# Patient Record
Sex: Female | Born: 1940 | Race: White | Hispanic: No | Marital: Married | Smoking: Former smoker
Health system: Southern US, Community
[De-identification: ages and names within clinical notes are randomized; demographics above are authoritative.]

## PROBLEM LIST (undated history)

## (undated) DIAGNOSIS — E785 Hyperlipidemia, unspecified: Secondary | ICD-10-CM

## (undated) DIAGNOSIS — M8580 Other specified disorders of bone density and structure, unspecified site: Secondary | ICD-10-CM

## (undated) DIAGNOSIS — N6320 Unspecified lump in the left breast, unspecified quadrant: Secondary | ICD-10-CM

## (undated) DIAGNOSIS — J302 Other seasonal allergic rhinitis: Secondary | ICD-10-CM

## (undated) DIAGNOSIS — H269 Unspecified cataract: Secondary | ICD-10-CM

## (undated) DIAGNOSIS — E079 Disorder of thyroid, unspecified: Secondary | ICD-10-CM

## (undated) DIAGNOSIS — M1990 Unspecified osteoarthritis, unspecified site: Secondary | ICD-10-CM

## (undated) DIAGNOSIS — T7840XA Allergy, unspecified, initial encounter: Secondary | ICD-10-CM

## (undated) DIAGNOSIS — R51 Headache: Secondary | ICD-10-CM

## (undated) DIAGNOSIS — F419 Anxiety disorder, unspecified: Secondary | ICD-10-CM

## (undated) DIAGNOSIS — R112 Nausea with vomiting, unspecified: Secondary | ICD-10-CM

## (undated) DIAGNOSIS — E039 Hypothyroidism, unspecified: Secondary | ICD-10-CM

## (undated) DIAGNOSIS — R918 Other nonspecific abnormal finding of lung field: Secondary | ICD-10-CM

## (undated) DIAGNOSIS — Z9889 Other specified postprocedural states: Secondary | ICD-10-CM

## (undated) HISTORY — PX: COLONOSCOPY: SHX174

## (undated) HISTORY — PX: BREAST SURGERY: SHX581

## (undated) HISTORY — DX: Unspecified cataract: H26.9

## (undated) HISTORY — DX: Disorder of thyroid, unspecified: E07.9

## (undated) HISTORY — DX: Unspecified osteoarthritis, unspecified site: M19.90

## (undated) HISTORY — DX: Other specified disorders of bone density and structure, unspecified site: M85.80

## (undated) HISTORY — PX: EYE SURGERY: SHX253

## (undated) HISTORY — PX: OTHER SURGICAL HISTORY: SHX169

## (undated) HISTORY — DX: Other seasonal allergic rhinitis: J30.2

## (undated) HISTORY — DX: Hyperlipidemia, unspecified: E78.5

## (undated) HISTORY — DX: Allergy, unspecified, initial encounter: T78.40XA

## (undated) HISTORY — DX: Anxiety disorder, unspecified: F41.9

---

## 2000-08-20 ENCOUNTER — Encounter: Payer: Self-pay | Admitting: General Surgery

## 2000-08-20 ENCOUNTER — Encounter: Admission: RE | Admit: 2000-08-20 | Discharge: 2000-08-20 | Payer: Self-pay | Admitting: General Surgery

## 2001-02-21 ENCOUNTER — Encounter: Admission: RE | Admit: 2001-02-21 | Discharge: 2001-02-21 | Payer: Self-pay | Admitting: General Surgery

## 2001-02-21 ENCOUNTER — Encounter: Payer: Self-pay | Admitting: General Surgery

## 2001-08-20 ENCOUNTER — Encounter: Admission: RE | Admit: 2001-08-20 | Discharge: 2001-08-20 | Payer: Self-pay | Admitting: Unknown Physician Specialty

## 2001-08-20 ENCOUNTER — Encounter: Payer: Self-pay | Admitting: Unknown Physician Specialty

## 2002-08-25 ENCOUNTER — Encounter: Payer: Self-pay | Admitting: Unknown Physician Specialty

## 2002-08-25 ENCOUNTER — Encounter: Admission: RE | Admit: 2002-08-25 | Discharge: 2002-08-25 | Payer: Self-pay | Admitting: Unknown Physician Specialty

## 2003-08-28 ENCOUNTER — Encounter: Admission: RE | Admit: 2003-08-28 | Discharge: 2003-08-28 | Payer: Self-pay | Admitting: Unknown Physician Specialty

## 2004-08-30 ENCOUNTER — Encounter: Admission: RE | Admit: 2004-08-30 | Discharge: 2004-08-30 | Payer: Self-pay | Admitting: Family Medicine

## 2005-05-01 ENCOUNTER — Ambulatory Visit: Payer: Self-pay | Admitting: Family Medicine

## 2005-05-24 ENCOUNTER — Ambulatory Visit: Payer: Self-pay | Admitting: Family Medicine

## 2005-06-02 ENCOUNTER — Ambulatory Visit: Payer: Self-pay | Admitting: Family Medicine

## 2005-06-12 ENCOUNTER — Ambulatory Visit: Payer: Self-pay | Admitting: Family Medicine

## 2005-07-20 ENCOUNTER — Ambulatory Visit: Payer: Self-pay | Admitting: Family Medicine

## 2005-08-24 ENCOUNTER — Ambulatory Visit: Payer: Self-pay | Admitting: Family Medicine

## 2005-08-31 ENCOUNTER — Encounter: Admission: RE | Admit: 2005-08-31 | Discharge: 2005-08-31 | Payer: Self-pay | Admitting: Family Medicine

## 2005-09-12 ENCOUNTER — Encounter: Admission: RE | Admit: 2005-09-12 | Discharge: 2005-09-12 | Payer: Self-pay | Admitting: Family Medicine

## 2005-10-18 ENCOUNTER — Ambulatory Visit: Payer: Self-pay | Admitting: Family Medicine

## 2005-11-21 ENCOUNTER — Ambulatory Visit: Payer: Self-pay | Admitting: Family Medicine

## 2006-01-18 ENCOUNTER — Ambulatory Visit: Payer: Self-pay | Admitting: Family Medicine

## 2006-05-30 ENCOUNTER — Ambulatory Visit: Payer: Self-pay | Admitting: Family Medicine

## 2006-09-17 ENCOUNTER — Encounter: Admission: RE | Admit: 2006-09-17 | Discharge: 2006-09-17 | Payer: Self-pay | Admitting: Family Medicine

## 2007-09-18 ENCOUNTER — Encounter: Admission: RE | Admit: 2007-09-18 | Discharge: 2007-09-18 | Payer: Self-pay | Admitting: Family Medicine

## 2008-09-23 ENCOUNTER — Encounter: Admission: RE | Admit: 2008-09-23 | Discharge: 2008-09-23 | Payer: Self-pay | Admitting: Family Medicine

## 2008-09-28 ENCOUNTER — Encounter: Admission: RE | Admit: 2008-09-28 | Discharge: 2008-09-28 | Payer: Self-pay | Admitting: Family Medicine

## 2009-09-28 ENCOUNTER — Encounter: Admission: RE | Admit: 2009-09-28 | Discharge: 2009-09-28 | Payer: Self-pay | Admitting: Family Medicine

## 2010-03-27 ENCOUNTER — Encounter: Payer: Self-pay | Admitting: Family Medicine

## 2010-05-04 ENCOUNTER — Encounter (INDEPENDENT_AMBULATORY_CARE_PROVIDER_SITE_OTHER): Payer: Self-pay | Admitting: *Deleted

## 2010-05-12 NOTE — Letter (Signed)
Summary: Pre Visit Letter Revised  [facility]  [address] [place], [place] [address]   Phone: [phone]  Fax: [phone]        05/04/2010 MRN: [id number] Cynthia Garrett [address] [place], [place]  [address]             Procedure Date:  06-08-10           Direct Colon---Dr. Russella Dar   Welcome to the Gastroenterology Division at [facility].    You are scheduled to see a nurse for your pre-procedure visit on 05-25-10 at 11:00a.m. on the 3rd floor at [facility], [address]  We ask that you try to arrive at our office 15 minutes prior to your appointment time to allow for check-in.  Please take a minute to review the attached form.  If you answer "Yes" to one or more of the questions on the first page, we ask that you call the person listed at your earliest opportunity.  If you answer "No" to all of the questions, please complete the rest of the form and bring it to your appointment.    Your nurse visit will consist of discussing your medical and surgical history, your immediate family medical history, and your medications.   If you are unable to list all of your medications on the form, please bring the medication bottles to your appointment and we will list them.  We will need to be aware of both prescribed and over the counter drugs.  We will need to know exact dosage information as well.    Please be prepared to read and sign documents such as consent forms, a financial agreement, and acknowledgement forms.  If necessary, and with your consent, a friend or relative is welcome to sit-in on the nurse visit with you.  Please bring your insurance card so that we may make a copy of it.  If your insurance requires a referral to see a specialist, please bring your referral form from your primary care physician.  No co-pay is required for this nurse visit.     If you cannot keep your appointment, please call [phone] to cancel or reschedule prior to  your appointment date.  This allows [place] the opportunity to schedule an appointment for another patient in need of care.    Thank you for choosing [facility] for your medical needs.  We appreciate the opportunity to care for you.  Please visit [place] at our website  to learn more about our practice.  Sincerely, The Gastroenterology Division

## 2010-05-25 ENCOUNTER — Encounter: Payer: Self-pay | Admitting: Gastroenterology

## 2010-05-25 ENCOUNTER — Ambulatory Visit (AMBULATORY_SURGERY_CENTER): Payer: Medicare Other

## 2010-05-25 VITALS — Ht 62.0 in | Wt 150.0 lb

## 2010-05-25 DIAGNOSIS — Z1211 Encounter for screening for malignant neoplasm of colon: Secondary | ICD-10-CM

## 2010-05-25 MED ORDER — PEG-KCL-NACL-NASULF-NA ASC-C 100 G PO SOLR: 1.0000 | Freq: Once | ORAL | Status: AC

## 2010-06-03 ENCOUNTER — Encounter: Payer: Self-pay | Admitting: Gastroenterology

## 2010-06-06 ENCOUNTER — Encounter: Payer: Self-pay | Admitting: Gastroenterology

## 2010-06-07 ENCOUNTER — Encounter: Payer: Self-pay | Admitting: Gastroenterology

## 2010-06-08 ENCOUNTER — Ambulatory Visit (AMBULATORY_SURGERY_CENTER): Payer: Medicare Other | Admitting: Gastroenterology

## 2010-06-08 ENCOUNTER — Encounter: Payer: Self-pay | Admitting: Gastroenterology

## 2010-06-08 DIAGNOSIS — Z1211 Encounter for screening for malignant neoplasm of colon: Secondary | ICD-10-CM

## 2010-06-08 DIAGNOSIS — D126 Benign neoplasm of colon, unspecified: Secondary | ICD-10-CM

## 2010-06-08 DIAGNOSIS — K5730 Diverticulosis of large intestine without perforation or abscess without bleeding: Secondary | ICD-10-CM

## 2010-06-08 MED ORDER — SODIUM CHLORIDE 0.9 % IV SOLN: 500.0000 mL | INTRAVENOUS | Status: DC

## 2010-06-08 NOTE — Patient Instructions (Addendum)
Please read over discharge instructions given. Dr.Stark removed 3 colon polyps today and found diverticulosis. Read over education sheets given on polyps,diverticulosis, and high fiber diet.  You will receive result letter in mail in about 1-3 weeks. Resume regular medications today. Resume routine care with primary physician.

## 2010-06-08 NOTE — Progress Notes (Signed)
After pt awake ready to get dressed, she became nauseated and started vomiting green-brown liq about 100 cc. Pt rested then wanted to get dressed and go home. She states she always dose this after any anesthesia and she will feel better at home. Notified Dr.Stark of this, he said she could go home and if nausea continues we will call her medication in today. Explained this to patient and husband, instructed her to call us if needed today before 5. Before d/c pt up in wheelchair,no vomiting at this time.

## 2010-06-09 ENCOUNTER — Telehealth: Payer: Self-pay | Admitting: *Deleted

## 2010-06-09 NOTE — Telephone Encounter (Signed)

## 2010-06-14 ENCOUNTER — Encounter: Payer: Self-pay | Admitting: Gastroenterology

## 2010-08-19 ENCOUNTER — Other Ambulatory Visit: Payer: Self-pay | Admitting: Family Medicine

## 2010-08-19 DIAGNOSIS — Z1231 Encounter for screening mammogram for malignant neoplasm of breast: Secondary | ICD-10-CM

## 2010-10-03 ENCOUNTER — Ambulatory Visit
Admission: RE | Admit: 2010-10-03 | Discharge: 2010-10-03 | Disposition: A | Discharge status: Home / Self Care | Payer: Medicare Other | Source: Ambulatory Visit | Attending: Family Medicine | Admitting: Family Medicine

## 2010-10-03 DIAGNOSIS — Z1231 Encounter for screening mammogram for malignant neoplasm of breast: Secondary | ICD-10-CM

## 2010-10-04 ENCOUNTER — Other Ambulatory Visit: Payer: Self-pay | Admitting: Family Medicine

## 2010-10-04 DIAGNOSIS — R928 Other abnormal and inconclusive findings on diagnostic imaging of breast: Secondary | ICD-10-CM

## 2010-10-13 ENCOUNTER — Ambulatory Visit
Admission: RE | Admit: 2010-10-13 | Discharge: 2010-10-13 | Disposition: A | Discharge status: Home / Self Care | Payer: Medicare Other | Source: Ambulatory Visit | Attending: Family Medicine | Admitting: Family Medicine

## 2010-10-13 DIAGNOSIS — R928 Other abnormal and inconclusive findings on diagnostic imaging of breast: Secondary | ICD-10-CM

## 2011-06-15 ENCOUNTER — Encounter (HOSPITAL_COMMUNITY): Payer: Self-pay | Admitting: Pharmacy Technician

## 2011-06-20 ENCOUNTER — Encounter (HOSPITAL_COMMUNITY): Payer: Self-pay

## 2011-06-20 ENCOUNTER — Encounter (HOSPITAL_COMMUNITY)
Admission: RE | Admit: 2011-06-20 | Discharge: 2011-06-20 | Disposition: A | Discharge status: Home / Self Care | Payer: Medicare Other | Source: Ambulatory Visit | Attending: Ophthalmology | Admitting: Ophthalmology

## 2011-06-20 HISTORY — DX: Nausea with vomiting, unspecified: R11.2

## 2011-06-20 HISTORY — DX: Headache: R51

## 2011-06-20 HISTORY — DX: Hypothyroidism, unspecified: E03.9

## 2011-06-20 HISTORY — DX: Other specified postprocedural states: Z98.890

## 2011-06-20 LAB — BASIC METABOLIC PANEL
BUN: 20 mg/dL (ref 6–23)
Chloride: 100 mEq/L (ref 96–112)
GFR calc Af Amer: 73 mL/min — ABNORMAL LOW (ref >90)
Glucose, Bld: 91 mg/dL (ref 70–99)
Potassium: 4.6 mEq/L (ref 3.5–5.1)
Sodium: 137 mEq/L (ref 135–145)

## 2011-06-20 NOTE — Patient Instructions (Signed)
20 Cynthia Garrett  06/20/2011   Your procedure is scheduled on:  06/26/2011  Report to [facility] at  1000  AM.  Call this number if you have problems the morning of surgery: [phone]   Remember:   Do not eat food:After Midnight.  May have clear liquids:until Midnight .  Clear liquids include soda, tea, black coffee, apple or grape juice, broth.  Take these medicines the morning of surgery with A SIP OF WATER: levothyroxine   Do not wear jewelry, make-up or nail polish.  Do not wear lotions, powders, or perfumes. You may wear deodorant.  Do not shave 48 hours prior to surgery.  Do not bring valuables to the hospital.  Contacts, dentures or bridgework may not be worn into surgery.  Leave suitcase in the car. After surgery it may be brought to your room.  For patients admitted to the hospital, checkout time is 11:00 AM the day of discharge.   Patients discharged the day of surgery will not be allowed to drive home.  Name and phone number of your driver: family  Special Instructions: N/A   Please read over the following fact sheets that you were given: Pain Booklet, Surgical Site Infection Prevention, Anesthesia Post-op Instructions and Care and Recovery After Surgery Cataract A cataract is a clouding of the lens of the eye. When a lens becomes cloudy, vision is reduced based on the degree and nature of the clouding. Many cataracts reduce vision to some degree. Some cataracts make people more near-sighted as they develop. Other cataracts increase glare. Cataracts that are ignored and become worse can sometimes look white. The white color can be seen through the pupil. CAUSES   Aging. However, cataracts may occur at any age, even in newborns.   Certain drugs.   Trauma to the eye.   Certain diseases such as diabetes.   Specific eye diseases such as chronic inflammation inside the eye or a sudden attack of a rare form of glaucoma.   Inherited or acquired medical problems.  SYMPTOMS     Gradual, progressive drop in vision in the affected eye.   Severe, rapid visual loss. This most often happens when trauma is the cause.  DIAGNOSIS  To detect a cataract, an eye doctor examines the lens. Cataracts are best diagnosed with an exam of the eyes with the pupils enlarged (dilated) by drops.  TREATMENT  For an early cataract, vision may improve by using different eyeglasses or stronger lighting. If that does not help your vision, surgery is the only effective treatment. A cataract needs to be surgically removed when vision loss interferes with your everyday activities, such as driving, reading, or watching TV. A cataract may also have to be removed if it prevents examination or treatment of another eye problem. Surgery removes the cloudy lens and usually replaces it with a substitute lens (intraocular lens, IOL).  At a time when both you and your doctor agree, the cataract will be surgically removed. If you have cataracts in both eyes, only one is usually removed at a time. This allows the operated eye to heal and be out of danger from any possible problems after surgery (such as infection or poor wound healing). In rare cases, a cataract may be doing damage to your eye. In these cases, your caregiver may advise surgical removal right away. The vast majority of people who have cataract surgery have better vision afterward. HOME CARE INSTRUCTIONS  If you are not planning surgery, you may be  asked to do the following:  Use different eyeglasses.   Use stronger or brighter lighting.   Ask your eye doctor about reducing your medicine dose or changing medicines if it is thought that a medicine caused your cataract. Changing medicines does not make the cataract go away on its own.   Become familiar with your surroundings. Poor vision can lead to injury. Avoid bumping into things on the affected side. You are at a higher risk for tripping or falling.   Exercise extreme care when driving or  operating machinery.   Wear sunglasses if you are sensitive to bright light or experiencing problems with glare.  SEEK IMMEDIATE MEDICAL CARE IF:   You have a worsening or sudden vision loss.   You notice redness, swelling, or increasing pain in the eye.   You have a fever.  Document Released: 02/20/2005 Document Revised: 02/09/2011 Document Reviewed: 10/14/2010 [facility] Patient Information 2012 [place], [place].PATIENT INSTRUCTIONS POST-ANESTHESIA  IMMEDIATELY FOLLOWING SURGERY:  Do not drive or operate machinery for the first twenty four hours after surgery.  Do not make any important decisions for twenty four hours after surgery or while taking narcotic pain medications or sedatives.  If you develop intractable nausea and vomiting or a severe headache please notify your doctor immediately.  FOLLOW-UP:  Please make an appointment with your surgeon as instructed. You do not need to follow up with anesthesia unless specifically instructed to do so.  WOUND CARE INSTRUCTIONS (if applicable):  Keep a dry clean dressing on the anesthesia/puncture wound site if there is drainage.  Once the wound has quit draining you may leave it open to air.  Generally you should leave the bandage intact for twenty four hours unless there is drainage.  If the epidural site drains for more than 36-48 hours please call the anesthesia department.  QUESTIONS?:  Please feel free to call your physician or the hospital operator if you have any questions, and they will be happy to assist you.     [facility] Anesthesia Department [address] [place] [phone]

## 2011-06-23 MED ORDER — NEOMYCIN-POLYMYXIN-DEXAMETH 3.5-10000-0.1 OP OINT
TOPICAL_OINTMENT | OPHTHALMIC | Status: AC
  Filled 2011-06-23: qty 3.5

## 2011-06-23 MED ORDER — LIDOCAINE HCL 3.5 % OP GEL
OPHTHALMIC | Status: AC
  Filled 2011-06-23: qty 5

## 2011-06-23 MED ORDER — PHENYLEPHRINE HCL 2.5 % OP SOLN
OPHTHALMIC | Status: AC
  Filled 2011-06-23: qty 2

## 2011-06-23 MED ORDER — TETRACAINE HCL 0.5 % OP SOLN
OPHTHALMIC | Status: AC
  Filled 2011-06-23: qty 2

## 2011-06-23 MED ORDER — LIDOCAINE HCL (PF) 1 % IJ SOLN
INTRAMUSCULAR | Status: AC
  Filled 2011-06-23: qty 2

## 2011-06-23 MED ORDER — CYCLOPENTOLATE-PHENYLEPHRINE 0.2-1 % OP SOLN
OPHTHALMIC | Status: AC
  Filled 2011-06-23: qty 2

## 2011-06-26 ENCOUNTER — Encounter (HOSPITAL_COMMUNITY)
Admission: RE | Disposition: A | Discharge status: Home / Self Care | Payer: Self-pay | Source: Ambulatory Visit | Attending: Ophthalmology

## 2011-06-26 ENCOUNTER — Encounter (HOSPITAL_COMMUNITY): Payer: Self-pay

## 2011-06-26 ENCOUNTER — Ambulatory Visit (HOSPITAL_COMMUNITY)
Admission: RE | Admit: 2011-06-26 | Discharge: 2011-06-26 | Disposition: A | Discharge status: Home / Self Care | Payer: Medicare Other | Source: Ambulatory Visit | Attending: Ophthalmology | Admitting: Ophthalmology

## 2011-06-26 ENCOUNTER — Encounter (HOSPITAL_COMMUNITY): Payer: Self-pay | Admitting: Anesthesiology

## 2011-06-26 ENCOUNTER — Ambulatory Visit (HOSPITAL_COMMUNITY): Payer: Medicare Other | Admitting: Anesthesiology

## 2011-06-26 DIAGNOSIS — Z7982 Long term (current) use of aspirin: Secondary | ICD-10-CM | POA: Insufficient documentation

## 2011-06-26 DIAGNOSIS — Z01812 Encounter for preprocedural laboratory examination: Secondary | ICD-10-CM | POA: Insufficient documentation

## 2011-06-26 DIAGNOSIS — H2589 Other age-related cataract: Secondary | ICD-10-CM | POA: Insufficient documentation

## 2011-06-26 DIAGNOSIS — Z01810 Encounter for preprocedural cardiovascular examination: Secondary | ICD-10-CM | POA: Insufficient documentation

## 2011-06-26 DIAGNOSIS — H268 Other specified cataract: Secondary | ICD-10-CM | POA: Insufficient documentation

## 2011-06-26 HISTORY — PX: CATARACT EXTRACTION W/PHACO: SHX586

## 2011-06-26 SURGERY — PHACOEMULSIFICATION, CATARACT, WITH IOL INSERTION
Anesthesia: Monitor Anesthesia Care | Site: Eye | Laterality: Right | Wound class: Clean

## 2011-06-26 MED ORDER — LIDOCAINE HCL (PF) 1 % IJ SOLN
INTRAMUSCULAR | Status: DC | PRN
  Administered 2011-06-26: .5 mL

## 2011-06-26 MED ORDER — BSS IO SOLN
INTRAOCULAR | Status: DC | PRN
  Administered 2011-06-26: 15 mL via INTRAOCULAR

## 2011-06-26 MED ORDER — NEOMYCIN-POLYMYXIN-DEXAMETH 0.1 % OP OINT
TOPICAL_OINTMENT | OPHTHALMIC | Status: DC | PRN
  Administered 2011-06-26: 1 via OPHTHALMIC

## 2011-06-26 MED ORDER — ONDANSETRON HCL 4 MG/2ML IJ SOLN: 4.0000 mg | Freq: Once | INTRAMUSCULAR | Status: DC | PRN

## 2011-06-26 MED ORDER — TETRACAINE HCL 0.5 % OP SOLN
1.0000 [drp] | OPHTHALMIC | Status: AC
  Administered 2011-06-26 (×3): 1 [drp] via OPHTHALMIC

## 2011-06-26 MED ORDER — LACTATED RINGERS IV SOLN
INTRAVENOUS | Status: DC
  Administered 2011-06-26: 08:00:00 via INTRAVENOUS

## 2011-06-26 MED ORDER — FENTANYL CITRATE 0.05 MG/ML IJ SOLN: 25.0000 ug | INTRAMUSCULAR | Status: DC | PRN

## 2011-06-26 MED ORDER — MIDAZOLAM HCL 2 MG/2ML IJ SOLN
INTRAMUSCULAR | Status: AC
  Administered 2011-06-26: 2 mg via INTRAVENOUS
  Filled 2011-06-26: qty 2

## 2011-06-26 MED ORDER — LIDOCAINE HCL 3.5 % OP GEL: 1.0000 "application " | Freq: Once | OPHTHALMIC | Status: DC

## 2011-06-26 MED ORDER — EPINEPHRINE HCL 1 MG/ML IJ SOLN
INTRAMUSCULAR | Status: AC
  Filled 2011-06-26: qty 1

## 2011-06-26 MED ORDER — LIDOCAINE 3.5 % OP GEL OPTIME - NO CHARGE
OPHTHALMIC | Status: DC | PRN
  Administered 2011-06-26: 2 [drp] via OPHTHALMIC

## 2011-06-26 MED ORDER — CYCLOPENTOLATE-PHENYLEPHRINE 0.2-1 % OP SOLN
1.0000 [drp] | OPHTHALMIC | Status: AC
  Administered 2011-06-26 (×3): 1 [drp] via OPHTHALMIC

## 2011-06-26 MED ORDER — PROVISC 10 MG/ML IO SOLN
INTRAOCULAR | Status: DC | PRN
  Administered 2011-06-26: 8.5 mg via INTRAOCULAR

## 2011-06-26 MED ORDER — MIDAZOLAM HCL 2 MG/2ML IJ SOLN
1.0000 mg | INTRAMUSCULAR | Status: DC | PRN
  Administered 2011-06-26: 2 mg via INTRAVENOUS

## 2011-06-26 MED ORDER — EPINEPHRINE HCL 1 MG/ML IJ SOLN
INTRAOCULAR | Status: DC | PRN
  Administered 2011-06-26: 09:00:00

## 2011-06-26 MED ORDER — POVIDONE-IODINE 5 % OP SOLN
OPHTHALMIC | Status: DC | PRN
  Administered 2011-06-26: 1 via OPHTHALMIC

## 2011-06-26 MED ORDER — PHENYLEPHRINE HCL 2.5 % OP SOLN
1.0000 [drp] | OPHTHALMIC | Status: AC
  Administered 2011-06-26 (×3): 1 [drp] via OPHTHALMIC

## 2011-06-26 SURGICAL SUPPLY — 33 items
CAPSULAR TENSION RING-AMO (OPHTHALMIC RELATED) IMPLANT
CLOTH BEACON ORANGE TIMEOUT ST (SAFETY) ×2 IMPLANT
EYE SHIELD UNIVERSAL CLEAR (GAUZE/BANDAGES/DRESSINGS) ×4 IMPLANT
GLOVE BIO SURGEON STRL SZ 6.5 (GLOVE) IMPLANT
GLOVE BIOGEL PI IND STRL 6.5 (GLOVE) ×2 IMPLANT
GLOVE BIOGEL PI IND STRL 7.0 (GLOVE) IMPLANT
GLOVE BIOGEL PI IND STRL 7.5 (GLOVE) IMPLANT
GLOVE BIOGEL PI INDICATOR 6.5 (GLOVE) ×2
GLOVE BIOGEL PI INDICATOR 7.0 (GLOVE)
GLOVE BIOGEL PI INDICATOR 7.5 (GLOVE)
GLOVE ECLIPSE 6.5 STRL STRAW (GLOVE) IMPLANT
GLOVE ECLIPSE 7.0 STRL STRAW (GLOVE) IMPLANT
GLOVE ECLIPSE 7.5 STRL STRAW (GLOVE) IMPLANT
GLOVE EXAM NITRILE LRG STRL (GLOVE) IMPLANT
GLOVE EXAM NITRILE MD LF STRL (GLOVE) ×2 IMPLANT
GLOVE SKINSENSE NS SZ6.5 (GLOVE)
GLOVE SKINSENSE NS SZ7.0 (GLOVE)
GLOVE SKINSENSE STRL SZ6.5 (GLOVE) IMPLANT
GLOVE SKINSENSE STRL SZ7.0 (GLOVE) IMPLANT
GOWN STRL REIN XL XLG (GOWN DISPOSABLE) ×2 IMPLANT
KIT VITRECTOMY (OPHTHALMIC RELATED) IMPLANT
PAD ARMBOARD 7.5X6 YLW CONV (MISCELLANEOUS) ×2 IMPLANT
PROC W NO LENS (INTRAOCULAR LENS)
PROC W SPEC LENS (INTRAOCULAR LENS)
PROCESS W NO LENS (INTRAOCULAR LENS) IMPLANT
PROCESS W SPEC LENS (INTRAOCULAR LENS) IMPLANT
RING MALYGIN (MISCELLANEOUS) IMPLANT
SIGHTPATH CAT PROC W REG LENS (Ophthalmic Related) ×2 IMPLANT
SYR TB 1ML LL NO SAFETY (SYRINGE) ×2 IMPLANT
TAPE SURG TRANSPORE 1 IN (GAUZE/BANDAGES/DRESSINGS) ×1 IMPLANT
TAPE SURGICAL TRANSPORE 1 IN (GAUZE/BANDAGES/DRESSINGS) ×1
VISCOELASTIC ADDITIONAL (OPHTHALMIC RELATED) IMPLANT
WATER STERILE IRR 250ML POUR (IV SOLUTION) ×2 IMPLANT

## 2011-06-26 NOTE — Discharge Instructions (Signed)
Cynthia Garrett  06/26/2011     Instructions  1. Use medications as Instructed.  Shake well before use. Wait 5 minutes between drops.  {OPHTHALMIC ANTIBIOTICS:22167} 4 times a day x 1 week.  {OPHTHALMIC ANTI-INFLAMMATORY:22168} 2 times a day x 4 weeks.  {OPHTHALMIC STEROID:22169} 4 times a day - week 1   3 times a day - Week 2, 2 times a day- Week 3, 1 time a day - Week 4.  2. Do not rub the operative eye. Do not swim underwater for 2 weeks.  3. You may remove the clear shield and resume your normal activities the day after  Surgery. Your eyes may feel more comfortable if you wear dark glasses outside.  4. Call our office at [phone] if you have sudden change in vision, extreme redness or pain. Some fluctuation in vision is normal after surgery. If you have an emergency after hours, call Dr. Alto Denver at [phone].  5. It is important that you attend all of your follow-up appointments.        Follow-up:{follow up:32580} with Gemma Payor, MD.   Dr. Lahoma Crocker: [phone]  Dr. Lita Mains: [phone]  Dr. Alto Denver: [phone]   If you find that you cannot contact your physician, but feel that your signs and   Symptoms warrant a physician's attention, call the Emergency Room at   [phone].   Other{NA AND WUXLKGMW:10272}.

## 2011-06-26 NOTE — Anesthesia Preprocedure Evaluation (Signed)
Anesthesia Evaluation  Patient identified by MRN, date of birth, ID band  Reviewed: Allergy & Precautions, H&P , NPO status , Patient's Chart, lab work & pertinent test results  History of Anesthesia Complications (+) PONV  Airway Mallampati: III      Dental  (+) Teeth Intact and Implants   Pulmonary  breath sounds clear to auscultation        Cardiovascular negative cardio ROS  Rhythm:Regular Rate:Normal     Neuro/Psych  Headaches,    GI/Hepatic   Endo/Other  Hypothyroidism   Renal/GU      Musculoskeletal   Abdominal   Peds  Hematology   Anesthesia Other Findings   Reproductive/Obstetrics                           Anesthesia Physical Anesthesia Plan  ASA: II  Anesthesia Plan: MAC   Post-op Pain Management:    Induction: Intravenous  Airway Management Planned: Nasal Cannula  Additional Equipment:   Intra-op Plan:   Post-operative Plan:   Informed Consent: I have reviewed the patients History and Physical, chart, labs and discussed the procedure including the risks, benefits and alternatives for the proposed anesthesia with the patient or authorized representative who has indicated his/her understanding and acceptance.     Plan Discussed with:   Anesthesia Plan Comments:         Anesthesia Quick Evaluation

## 2011-06-26 NOTE — Brief Op Note (Signed)
Pre-Op Dx: Cataract OD,  Pseudoexfoliation syndrome Post-Op Dx: Cataract OD, Pseudoexfoliation syndrome Surgeon: Gemma Payor Anesthesia: Topical with MAC Implant: B&L enVista Specimen: None Complications: None

## 2011-06-26 NOTE — Transfer of Care (Signed)
Immediate Anesthesia Transfer of Care Note  Patient: Cynthia Garrett  Procedure(s) Performed: Procedure(s) (LRB): CATARACT EXTRACTION PHACO AND INTRAOCULAR LENS PLACEMENT (IOC) (Right)  Patient Location: PACU and Short Stay  Anesthesia Type: MAC  Level of Consciousness: awake, alert  and oriented  Airway & Oxygen Therapy: Patient Spontanous Breathing  Post-op Assessment: Report given to PACU RN  Post vital signs: Reviewed and stable  Complications: No apparent anesthesia complications

## 2011-06-26 NOTE — Op Note (Signed)
Cynthia Garrett, Cynthia Garrett               ACCOUNT NO.:  [id number]  MEDICAL RECORD NO.:  [id number]  LOCATION:  APPO                          FACILITY:  [facility]  PHYSICIAN:  Susanne Greenhouse, MD       DATE OF BIRTH:  01/09/1941  DATE OF PROCEDURE:  06/26/2011 DATE OF DISCHARGE:  06/26/2011                              OPERATIVE REPORT   PREOPERATIVE DIAGNOSES: 1. 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  POSTOPERATIVE DIAGNOSES: 1. Combined cataract, right eye, diagnosis code 366.19. 2. 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  OPERATION PERFORMED:  Phacoemulsification with posterior chamber intraocular lens implantation, right eye.  SURGEON:  Bonne Dolores. Quinlan Mcfall, MD  ANESTHESIA:  General endotracheal anesthesia.  OPERATIVE SUMMARY:  In the preoperative area, dilating drops were placed into the right eye.  The patient was then brought into the operating room where she was placed under general anesthesia.  The eye was then prepped and draped.  Beginning with a 75 blade, a paracentesis port was made at the surgeon's 2 o'clock position.  The anterior chamber was then filled with a 1% nonpreserved lidocaine solution with epinephrine.  This was followed by Viscoat to deepen the chamber.  A small fornix-based peritomy was performed superiorly.  Next, a single iris hook was placed through the limbus superiorly.  A 2.4-mm keratome blade was then used to make a clear corneal incision over the iris hook.  A bent cystotome needle and Utrata forceps were used to create a continuous tear capsulotomy.  Hydrodissection was performed using balanced salt solution on a fine cannula.  The lens nucleus was then removed using phacoemulsification in a quadrant cracking technique.  The cortical material was then removed with irrigation and aspiration.  The capsular bag and anterior chamber were refilled with Provisc.  The wound  was widened to approximately 3 mm and a posterior chamber intraocular lens was placed into the capsular bag without difficulty using an [employer] lens injecting system.  A single 10-0 nylon suture was then used to close the incision as well as stromal hydration.  The Provisc was removed from the anterior chamber and capsular bag with irrigation and aspiration.  At this point, the wounds were tested for leak, which were negative.  The anterior chamber remained deep and stable.  The patient tolerated the procedure well.  There were no operative complications, and she awoke from general anesthesia without problem.  No surgical specimens.  Prosthetic device used is a Actuary, EnVista posterior chamber lens, model MX60, power of 22.5, serial number is [id number].          ______________________________ Susanne Greenhouse, MD     KEH/MEDQ  D:  06/26/2011  T:  06/26/2011  Job:  [id number]

## 2011-06-26 NOTE — H&P (Signed)
I have reviewed the H&P, the patient was re-examined, and I have identified no interval changes in medical condition and plan of care since the history and physical of record  

## 2011-06-26 NOTE — Anesthesia Postprocedure Evaluation (Signed)
  Anesthesia Post-op Note  Patient: Cynthia Garrett  Procedure(s) Performed: Procedure(s) (LRB): CATARACT EXTRACTION PHACO AND INTRAOCULAR LENS PLACEMENT (IOC) (Right)  Patient Location: PACU and Short Stay  Anesthesia Type: MAC  Level of Consciousness: awake  Airway and Oxygen Therapy: Patient Spontanous Breathing  Post-op Pain: none  Post-op Assessment: Post-op Vital signs reviewed, Patient's Cardiovascular Status Stable, Respiratory Function Stable, Patent Airway and No signs of Nausea or vomiting  Post-op Vital Signs: Reviewed and stable  Complications: No apparent anesthesia complications

## 2011-06-28 ENCOUNTER — Encounter (HOSPITAL_COMMUNITY): Payer: Self-pay | Admitting: Ophthalmology

## 2011-07-04 ENCOUNTER — Encounter (HOSPITAL_COMMUNITY): Payer: Self-pay

## 2011-07-04 ENCOUNTER — Encounter (HOSPITAL_COMMUNITY): Payer: Medicare Other

## 2011-07-05 MED ORDER — TETRACAINE HCL 0.5 % OP SOLN
OPHTHALMIC | Status: AC
  Administered 2011-07-06: 1 [drp] via OPHTHALMIC
  Filled 2011-07-05: qty 2

## 2011-07-05 MED ORDER — NEOMYCIN-POLYMYXIN-DEXAMETH 3.5-10000-0.1 OP OINT
TOPICAL_OINTMENT | OPHTHALMIC | Status: AC
  Filled 2011-07-05: qty 3.5

## 2011-07-06 ENCOUNTER — Ambulatory Visit (HOSPITAL_COMMUNITY)
Admission: RE | Admit: 2011-07-06 | Discharge: 2011-07-06 | Disposition: A | Discharge status: Home / Self Care | Payer: Medicare Other | Source: Ambulatory Visit | Attending: Ophthalmology | Admitting: Ophthalmology

## 2011-07-06 ENCOUNTER — Encounter (HOSPITAL_COMMUNITY): Payer: Self-pay | Admitting: Anesthesiology

## 2011-07-06 ENCOUNTER — Encounter (HOSPITAL_COMMUNITY)
Admission: RE | Disposition: A | Discharge status: Home / Self Care | Payer: Self-pay | Source: Ambulatory Visit | Attending: Ophthalmology

## 2011-07-06 ENCOUNTER — Encounter (HOSPITAL_COMMUNITY): Payer: Self-pay | Admitting: *Deleted

## 2011-07-06 ENCOUNTER — Ambulatory Visit (HOSPITAL_COMMUNITY): Payer: Medicare Other | Admitting: Anesthesiology

## 2011-07-06 DIAGNOSIS — H2589 Other age-related cataract: Secondary | ICD-10-CM | POA: Insufficient documentation

## 2011-07-06 DIAGNOSIS — H2181 Floppy iris syndrome: Secondary | ICD-10-CM | POA: Insufficient documentation

## 2011-07-06 HISTORY — PX: CATARACT EXTRACTION W/PHACO: SHX586

## 2011-07-06 SURGERY — PHACOEMULSIFICATION, CATARACT, WITH IOL INSERTION
Anesthesia: Monitor Anesthesia Care | Site: Eye | Laterality: Left | Wound class: Clean

## 2011-07-06 MED ORDER — BSS IO SOLN
INTRAOCULAR | Status: DC | PRN
  Administered 2011-07-06: 15 mL via INTRAOCULAR

## 2011-07-06 MED ORDER — ONDANSETRON HCL 4 MG/2ML IJ SOLN
4.0000 mg | Freq: Once | INTRAMUSCULAR | Status: AC
  Administered 2011-07-06: 4 mg via INTRAVENOUS

## 2011-07-06 MED ORDER — TETRACAINE HCL 0.5 % OP SOLN
1.0000 [drp] | OPHTHALMIC | Status: AC
  Administered 2011-07-06 (×3): 1 [drp] via OPHTHALMIC

## 2011-07-06 MED ORDER — LIDOCAINE HCL 3.5 % OP GEL
1.0000 "application " | Freq: Once | OPHTHALMIC | Status: AC
  Administered 2011-07-06: 1 via OPHTHALMIC

## 2011-07-06 MED ORDER — LACTATED RINGERS IV SOLN
INTRAVENOUS | Status: DC
  Administered 2011-07-06: 1000 mL via INTRAVENOUS

## 2011-07-06 MED ORDER — PROVISC 10 MG/ML IO SOLN
INTRAOCULAR | Status: DC | PRN
  Administered 2011-07-06: 8.5 mg via INTRAOCULAR

## 2011-07-06 MED ORDER — CYCLOPENTOLATE-PHENYLEPHRINE 0.2-1 % OP SOLN
1.0000 [drp] | OPHTHALMIC | Status: AC
  Administered 2011-07-06 (×3): 1 [drp] via OPHTHALMIC

## 2011-07-06 MED ORDER — CYCLOPENTOLATE-PHENYLEPHRINE 0.2-1 % OP SOLN
OPHTHALMIC | Status: AC
  Administered 2011-07-06: 1 [drp] via OPHTHALMIC
  Filled 2011-07-06: qty 2

## 2011-07-06 MED ORDER — LACTATED RINGERS IV SOLN
INTRAVENOUS | Status: DC | PRN
  Administered 2011-07-06: 13:00:00 via INTRAVENOUS

## 2011-07-06 MED ORDER — LIDOCAINE HCL 3.5 % OP GEL
OPHTHALMIC | Status: AC
  Administered 2011-07-06: 1 via OPHTHALMIC
  Filled 2011-07-06: qty 5

## 2011-07-06 MED ORDER — LIDOCAINE HCL (PF) 1 % IJ SOLN
INTRAMUSCULAR | Status: AC
  Filled 2011-07-06: qty 2

## 2011-07-06 MED ORDER — NEOMYCIN-POLYMYXIN-DEXAMETH 0.1 % OP OINT
TOPICAL_OINTMENT | OPHTHALMIC | Status: DC | PRN
  Administered 2011-07-06: 1 via OPHTHALMIC

## 2011-07-06 MED ORDER — LIDOCAINE HCL (PF) 1 % IJ SOLN
INTRAMUSCULAR | Status: DC | PRN
  Administered 2011-07-06: .3 mL

## 2011-07-06 MED ORDER — PHENYLEPHRINE HCL 2.5 % OP SOLN
OPHTHALMIC | Status: AC
  Administered 2011-07-06: 1 [drp] via OPHTHALMIC
  Filled 2011-07-06: qty 2

## 2011-07-06 MED ORDER — PHENYLEPHRINE HCL 2.5 % OP SOLN
1.0000 [drp] | OPHTHALMIC | Status: AC
  Administered 2011-07-06 (×3): 1 [drp] via OPHTHALMIC

## 2011-07-06 MED ORDER — POVIDONE-IODINE 5 % OP SOLN
OPHTHALMIC | Status: DC | PRN
  Administered 2011-07-06: 1 via OPHTHALMIC

## 2011-07-06 MED ORDER — EPINEPHRINE HCL 1 MG/ML IJ SOLN
INTRAMUSCULAR | Status: AC
  Filled 2011-07-06: qty 1

## 2011-07-06 MED ORDER — EPINEPHRINE HCL 1 MG/ML IJ SOLN
INTRAOCULAR | Status: DC | PRN
  Administered 2011-07-06: 13:00:00

## 2011-07-06 MED ORDER — MIDAZOLAM HCL 2 MG/2ML IJ SOLN
1.0000 mg | INTRAMUSCULAR | Status: DC | PRN
  Administered 2011-07-06: 2 mg via INTRAVENOUS

## 2011-07-06 MED ORDER — LIDOCAINE 3.5 % OP GEL OPTIME - NO CHARGE
OPHTHALMIC | Status: DC | PRN
  Administered 2011-07-06: 1 [drp] via OPHTHALMIC

## 2011-07-06 SURGICAL SUPPLY — 30 items
CAPSULAR TENSION RING-AMO (OPHTHALMIC RELATED) IMPLANT
CLOTH BEACON ORANGE TIMEOUT ST (SAFETY) ×2 IMPLANT
EYE SHIELD UNIVERSAL CLEAR (GAUZE/BANDAGES/DRESSINGS) ×4 IMPLANT
GLOVE BIO SURGEON STRL SZ 6.5 (GLOVE) ×2 IMPLANT
GLOVE BIOGEL PI IND STRL 6.5 (GLOVE) ×1 IMPLANT
GLOVE BIOGEL PI IND STRL 7.0 (GLOVE) IMPLANT
GLOVE BIOGEL PI IND STRL 7.5 (GLOVE) IMPLANT
GLOVE BIOGEL PI INDICATOR 6.5 (GLOVE) ×1
GLOVE BIOGEL PI INDICATOR 7.0 (GLOVE)
GLOVE BIOGEL PI INDICATOR 7.5 (GLOVE)
GLOVE ECLIPSE 6.5 STRL STRAW (GLOVE) IMPLANT
GLOVE ECLIPSE 7.0 STRL STRAW (GLOVE) IMPLANT
GLOVE ECLIPSE 7.5 STRL STRAW (GLOVE) IMPLANT
GLOVE EXAM NITRILE LRG STRL (GLOVE) IMPLANT
GLOVE EXAM NITRILE MD LF STRL (GLOVE) ×2 IMPLANT
GLOVE SKINSENSE NS SZ6.5 (GLOVE)
GLOVE SKINSENSE NS SZ7.0 (GLOVE)
GLOVE SKINSENSE STRL SZ6.5 (GLOVE) IMPLANT
GLOVE SKINSENSE STRL SZ7.0 (GLOVE) IMPLANT
KIT VITRECTOMY (OPHTHALMIC RELATED) IMPLANT
PAD ARMBOARD 7.5X6 YLW CONV (MISCELLANEOUS) ×2 IMPLANT
PROC W NO LENS (INTRAOCULAR LENS)
PROC W SPEC LENS (INTRAOCULAR LENS)
PROCESS W NO LENS (INTRAOCULAR LENS) IMPLANT
PROCESS W SPEC LENS (INTRAOCULAR LENS) IMPLANT
RING MALYGIN (MISCELLANEOUS) IMPLANT
SIGHTPATH CAT PROC W REG LENS (Ophthalmic Related) ×2 IMPLANT
SYR TB 1ML LL NO SAFETY (SYRINGE) ×2 IMPLANT
VISCOELASTIC ADDITIONAL (OPHTHALMIC RELATED) IMPLANT
WATER STERILE IRR 250ML POUR (IV SOLUTION) ×2 IMPLANT

## 2011-07-06 NOTE — Brief Op Note (Signed)
Pre-Op Dx: Cataract OS Post-Op Dx: Cataract OS Surgeon: Dequavius Kuhner Anesthesia: Topical with MAC Surgery: Cataract Extraction with Intraocular lens Implant OS Implant: B&L enVista Specimen: None Complications: None 

## 2011-07-06 NOTE — Anesthesia Postprocedure Evaluation (Signed)
  Anesthesia Post-op Note  Patient: Cynthia Garrett  Procedure(s) Performed: Procedure(s) (LRB): CATARACT EXTRACTION PHACO AND INTRAOCULAR LENS PLACEMENT (IOC) (Left)  Patient Location: PACU and Short Stay  Anesthesia Type: MAC  Level of Consciousness: awake, alert  and oriented  Airway and Oxygen Therapy: Patient Spontanous Breathing  Post-op Pain: none  Post-op Assessment: Post-op Vital signs reviewed, Patient's Cardiovascular Status Stable, Respiratory Function Stable, Patent Airway, No signs of Nausea or vomiting and Pain level controlled  Post-op Vital Signs: Reviewed  Complications: No apparent anesthesia complications

## 2011-07-06 NOTE — Op Note (Signed)
NAMESHONI, QUIJAS               ACCOUNT NO.:  [id number]  MEDICAL RECORD NO.:  [id number]  LOCATION:  APPO                          FACILITY:  [facility]  PHYSICIAN:  Susanne Greenhouse, MD       DATE OF BIRTH:  23-Jun-1940  DATE OF PROCEDURE:  07/06/2011 DATE OF DISCHARGE:  07/06/2011                              OPERATIVE REPORT   PREOPERATIVE DIAGNOSIS:  Combined cataract, left eye, diagnosis code 366.19.  POSTOPERATIVE DIAGNOSES:  Combined cataract, left eye, diagnosis code 366.19, intraoperative floppy iris syndrome, left eye, diagnosis code 364.81.  SURGEON:  Bonne Dolores. Cletis Clack, MD  ANESTHESIA:  Topical with monitored anesthesia care.  DESCRIPTION OF THE OPERATION:  In the preoperative holding area, dilating drop and viscous lidocaine were placed into the left eye.  The patient was then brought to the operating room where she was prepped and draped.  Beginning with a #75 blade, a paracentesis port was made at the surgeon's 2 o'clock position.  The anterior chamber was filled with a 1% nonpreserved lidocaine solution.  Because of poor visualization of the red reflex, the anterior chamber was filled with VisionBlue and the VisionBlue was then rinsed from the anterior chamber with balanced salt solution.  The anterior chamber was then filled with Provisc.  A 2.4-mm keratome blade was then used to make a clear corneal incision at the temporal limbus.  A bent cystotome needle was used to create a continuous tear capsulotomy.  Hydrodissection was performed with balanced salt solution and a fine cannula.  The lens nucleus was then removed using phacoemulsification and a quadrant cracking technique. Residual cortex was removed with irrigation and aspiration.  A capsular bag and anterior chamber were refilled with Provisc and a posterior chamber intraocular lens was placed into the capsular bag without difficulty using its lens injecting system.  The Provisc was then removed from the capsular  bag and anterior chamber with irrigation and aspiration.  Stromal hydration of the main incision and paracentesis ports was performed with balanced salt solution and a fine cannula.  The wounds were tested for leak, which were negative.  The patient tolerated the procedure well.  There were no operative complications and she was returned to the recovery area in satisfactory condition.  No surgical specimens.  Prosthetic device used is a Designer, industrial/product lens, model MX60, power of 23.5, serial number is [id number].         ______________________________ Susanne Greenhouse, MD    KEH/MEDQ  D:  07/06/2011  T:  07/06/2011  Job:  [id number]

## 2011-07-06 NOTE — Anesthesia Preprocedure Evaluation (Signed)
Anesthesia Evaluation  Patient identified by MRN, date of birth, ID band  Reviewed: Allergy & Precautions, H&P , NPO status , Patient's Chart, lab work & pertinent test results  History of Anesthesia Complications (+) PONV  Airway Mallampati: III      Dental  (+) Teeth Intact and Implants   Pulmonary  breath sounds clear to auscultation        Cardiovascular negative cardio ROS  Rhythm:Regular Rate:Normal     Neuro/Psych  Headaches,    GI/Hepatic   Endo/Other  Hypothyroidism   Renal/GU      Musculoskeletal   Abdominal   Peds  Hematology   Anesthesia Other Findings   Reproductive/Obstetrics                           Anesthesia Physical Anesthesia Plan  ASA: II  Anesthesia Plan: MAC   Post-op Pain Management:    Induction: Intravenous  Airway Management Planned: Nasal Cannula  Additional Equipment:   Intra-op Plan:   Post-operative Plan:   Informed Consent: I have reviewed the patients History and Physical, chart, labs and discussed the procedure including the risks, benefits and alternatives for the proposed anesthesia with the patient or authorized representative who has indicated his/her understanding and acceptance.     Plan Discussed with:   Anesthesia Plan Comments:         Anesthesia Quick Evaluation  

## 2011-07-06 NOTE — Preoperative (Signed)
Beta Blockers   Reason not to administer Beta Blockers:Not Applicable 

## 2011-07-06 NOTE — Discharge Instructions (Signed)
Cynthia Garrett  07/06/2011     Instructions  1. Use medications as Instructed.  Shake well before use. Wait 5 minutes between drops.  {OPHTHALMIC ANTIBIOTICS:22167} 4 times a day x 1 week.  {OPHTHALMIC ANTI-INFLAMMATORY:22168} 2 times a day x 4 weeks.  {OPHTHALMIC STEROID:22169} 4 times a day - week 1   3 times a day - Week 2, 2 times a day- Week 3, 1 time a day - Week 4.  2. Do not rub the operative eye. Do not swim underwater for 2 weeks.  3. You may remove the clear shield and resume your normal activities the day after  Surgery. Your eyes may feel more comfortable if you wear dark glasses outside.  4. Call our office at [phone] if you have sudden change in vision, extreme redness or pain. Some fluctuation in vision is normal after surgery. If you have an emergency after hours, call Dr. Alto Denver at [phone].  5. It is important that you attend all of your follow-up appointments.        Follow-up:{follow up:32580} with Gemma Payor, MD.   Dr. Lahoma Crocker: [phone]  Dr. Lita Mains: [phone]  Dr. Alto Denver: [phone]   If you find that you cannot contact your physician, but feel that your signs and   Symptoms warrant a physician's attention, call the Emergency Room at   [phone].   Other{NA AND [id number]:44034}.

## 2011-07-06 NOTE — H&P (Signed)
I have reviewed the H&P, the patient was re-examined, and I have identified no interval changes in medical condition and plan of care since the history and physical of record  

## 2011-07-06 NOTE — Transfer of Care (Signed)
Immediate Anesthesia Transfer of Care Note  Patient: Cynthia Garrett  Procedure(s) Performed: Procedure(s) (LRB): CATARACT EXTRACTION PHACO AND INTRAOCULAR LENS PLACEMENT (IOC) (Left)  Patient Location: PACU and Short Stay  Anesthesia Type: MAC  Level of Consciousness: awake, alert  and oriented  Airway & Oxygen Therapy: Patient Spontanous Breathing  Post-op Assessment: Report given to PACU RN  Post vital signs: Reviewed  Complications: No apparent anesthesia complications

## 2011-07-10 ENCOUNTER — Encounter (HOSPITAL_COMMUNITY): Payer: Self-pay | Admitting: Ophthalmology

## 2011-08-07 ENCOUNTER — Other Ambulatory Visit: Payer: Self-pay | Admitting: Family Medicine

## 2011-08-07 DIAGNOSIS — Z1231 Encounter for screening mammogram for malignant neoplasm of breast: Secondary | ICD-10-CM

## 2011-10-17 ENCOUNTER — Ambulatory Visit: Payer: Medicare Other

## 2011-10-18 ENCOUNTER — Ambulatory Visit
Admission: RE | Admit: 2011-10-18 | Discharge: 2011-10-18 | Disposition: A | Discharge status: Home / Self Care | Payer: Medicare Other | Source: Ambulatory Visit | Attending: Family Medicine | Admitting: Family Medicine

## 2011-10-18 DIAGNOSIS — Z1231 Encounter for screening mammogram for malignant neoplasm of breast: Secondary | ICD-10-CM

## 2012-09-02 ENCOUNTER — Other Ambulatory Visit: Payer: Self-pay

## 2012-09-02 DIAGNOSIS — Z1231 Encounter for screening mammogram for malignant neoplasm of breast: Secondary | ICD-10-CM

## 2012-10-21 ENCOUNTER — Ambulatory Visit
Admission: RE | Admit: 2012-10-21 | Discharge: 2012-10-21 | Disposition: A | Discharge status: Home / Self Care | Payer: Medicare Other | Source: Ambulatory Visit

## 2012-10-21 DIAGNOSIS — Z1231 Encounter for screening mammogram for malignant neoplasm of breast: Secondary | ICD-10-CM

## 2013-09-09 ENCOUNTER — Telehealth: Payer: Self-pay | Admitting: Family Medicine

## 2013-09-10 NOTE — Telephone Encounter (Signed)
Wrong chart

## 2013-09-19 ENCOUNTER — Other Ambulatory Visit: Payer: Self-pay

## 2013-09-19 DIAGNOSIS — Z1231 Encounter for screening mammogram for malignant neoplasm of breast: Secondary | ICD-10-CM

## 2013-10-22 ENCOUNTER — Ambulatory Visit
Admission: RE | Admit: 2013-10-22 | Discharge: 2013-10-22 | Disposition: A | Discharge status: Home / Self Care | Payer: Medicare Other | Source: Ambulatory Visit

## 2013-10-22 DIAGNOSIS — Z1231 Encounter for screening mammogram for malignant neoplasm of breast: Secondary | ICD-10-CM

## 2014-09-08 ENCOUNTER — Other Ambulatory Visit: Payer: Self-pay

## 2014-09-08 DIAGNOSIS — Z1231 Encounter for screening mammogram for malignant neoplasm of breast: Secondary | ICD-10-CM

## 2014-10-26 ENCOUNTER — Ambulatory Visit
Admission: RE | Admit: 2014-10-26 | Discharge: 2014-10-26 | Disposition: A | Discharge status: Home / Self Care | Payer: Medicare Other | Source: Ambulatory Visit

## 2014-10-26 ENCOUNTER — Encounter (INDEPENDENT_AMBULATORY_CARE_PROVIDER_SITE_OTHER): Payer: Self-pay

## 2014-10-26 DIAGNOSIS — Z1231 Encounter for screening mammogram for malignant neoplasm of breast: Secondary | ICD-10-CM

## 2015-04-30 ENCOUNTER — Encounter: Payer: Self-pay | Admitting: Gastroenterology

## 2015-06-16 ENCOUNTER — Ambulatory Visit (AMBULATORY_SURGERY_CENTER): Payer: Self-pay | Admitting: *Deleted

## 2015-06-16 VITALS — Ht 62.0 in | Wt 151.0 lb

## 2015-06-16 DIAGNOSIS — Z86010 Personal history of colonic polyps: Secondary | ICD-10-CM

## 2015-06-16 MED ORDER — NA SULFATE-K SULFATE-MG SULF 17.5-3.13-1.6 GM/177ML PO SOLN: 1.0000 | Freq: Once | ORAL | Status: DC

## 2015-06-16 NOTE — Progress Notes (Signed)
No egg or soy allergy. No anesthesia problems. Nausea and vomiting.  No home O2.  No diet meds.

## 2015-06-30 ENCOUNTER — Ambulatory Visit (AMBULATORY_SURGERY_CENTER): Payer: Medicare Other | Admitting: Gastroenterology

## 2015-06-30 ENCOUNTER — Encounter: Payer: Self-pay | Admitting: Gastroenterology

## 2015-06-30 VITALS — BP 132/71 | HR 71 | Temp 98.0°F | Resp 16 | Ht 62.0 in | Wt 151.0 lb

## 2015-06-30 DIAGNOSIS — D125 Benign neoplasm of sigmoid colon: Secondary | ICD-10-CM | POA: Diagnosis not present

## 2015-06-30 DIAGNOSIS — K635 Polyp of colon: Secondary | ICD-10-CM

## 2015-06-30 DIAGNOSIS — Z86010 Personal history of colonic polyps: Secondary | ICD-10-CM | POA: Diagnosis not present

## 2015-06-30 MED ORDER — SODIUM CHLORIDE 0.9 % IV SOLN: 500.0000 mL | INTRAVENOUS | Status: DC

## 2015-06-30 NOTE — Patient Instructions (Signed)
YOU HAD AN ENDOSCOPIC PROCEDURE TODAY AT [facility]:   Refer to the procedure report that was given to you for any specific questions about what was found during the examination.  If the procedure report does not answer your questions, please call your gastroenterologist to clarify.  If you requested that your care partner not be given the details of your procedure findings, then the procedure report has been included in a sealed envelope for you to review at your convenience later.  YOU SHOULD EXPECT: Some feelings of bloating in the abdomen. Passage of more gas than usual.  Walking can help get rid of the air that was put into your GI tract during the procedure and reduce the bloating. If you had a lower endoscopy (such as a colonoscopy or flexible sigmoidoscopy) you may notice spotting of blood in your stool or on the toilet paper. If you underwent a bowel prep for your procedure, you may not have a normal bowel movement for a few days.  Please Note:  You might notice some irritation and congestion in your nose or some drainage.  This is from the oxygen used during your procedure.  There is no need for concern and it should clear up in a day or so.  SYMPTOMS TO REPORT IMMEDIATELY:   Following lower endoscopy (colonoscopy or flexible sigmoidoscopy):  Excessive amounts of blood in the stool  Significant tenderness or worsening of abdominal pains  Swelling of the abdomen that is new, acute  Fever of 100F or higher   For urgent or emergent issues, a gastroenterologist can be reached at any hour by calling [phone].   DIET: Your first meal following the procedure should be a small meal and then it is ok to progress to your normal diet. Heavy or fried foods are harder to digest and may make you feel nauseous or bloated.  Likewise, meals heavy in dairy and vegetables can increase bloating.  Drink plenty of fluids but you should avoid alcoholic beverages for 24  hours.  ACTIVITY:  You should plan to take it easy for the rest of today and you should NOT DRIVE or use heavy machinery until tomorrow (because of the sedation medicines used during the test).   Please read all handouts given to you by your recovery Rn. FOLLOW UP: Our staff will call the number listed on your records the next business day following your procedure to check on you and address any questions or concerns that you may have regarding the information given to you following your procedure. If we do not reach you, we will leave a message.  However, if you are feeling well and you are not experiencing any problems, there is no need to return our call.  We will assume that you have returned to your regular daily activities without incident.  If any biopsies were taken you will be contacted by phone or by letter within the next 1-3 weeks.  Please call us at [phone] if you have not heard about the biopsies in 3 weeks.    SIGNATURES/CONFIDENTIALITY: You and/or your care partner have signed paperwork which will be entered into your electronic medical record.  These signatures attest to the fact that that the information above on your After Visit Summary has been reviewed and is understood.  Full responsibility of the confidentiality of this discharge information lies with you and/or your care-partner.  Thank you for letting us take care of your healthcare needs.

## 2015-06-30 NOTE — Progress Notes (Signed)
Report to PACU, RN, vss, BBS= Clear.  

## 2015-06-30 NOTE — Op Note (Signed)
[facility] Patient Name: Cynthia Garrett Procedure Date: 06/30/2015 10:57 AM MRN: [id number] Endoscopist: Ladene Artist , MD Age: 75 Date of Birth: 12-08-1940 Gender: Female Procedure:                Colonoscopy Indications:              Surveillance: Personal history of adenomatous                            polyps on last colonoscopy > 5 years ago Medicines:                Monitored Anesthesia Care Procedure:                Pre-Anesthesia Assessment:                           - Prior to the procedure, a History and Physical                            was performed, and patient medications and                            allergies were reviewed. The patient's tolerance of                            previous anesthesia was also reviewed. The risks                            and benefits of the procedure and the sedation                            options and risks were discussed with the patient.                            All questions were answered, and informed consent                            was obtained. Prior Anticoagulants: The patient has                            taken no previous anticoagulant or antiplatelet                            agents. ASA Grade Assessment: II - A patient with                            mild systemic disease. After reviewing the risks                            and benefits, the patient was deemed in                            satisfactory condition to undergo the procedure.  After obtaining informed consent, the colonoscope                            was passed under direct vision. Throughout the                            procedure, the patient's blood pressure, pulse, and                            oxygen saturations were monitored continuously. The                            Model [id number]-[id number]) scope was introduced                            through the anus and advanced to the the cecum,                     identified by appendiceal orifice and ileocecal                            valve. The colonoscopy was performed without                            difficulty. The patient tolerated the procedure                            well. The quality of the bowel preparation was                            good. The ileocecal valve, appendiceal orifice, and                            rectum were photographed. Scope In: [id number] Scope Out: 11:20:24 AM Scope Withdrawal Time: 0 hours 10 minutes 24 seconds  Total Procedure Duration: 0 hours 14 minutes 13 seconds  Findings:                 The digital rectal exam was normal.                           A 5 mm polyp was found in the sigmoid colon. The                            polyp was sessile. The polyp was removed with a                            cold snare. Resection and retrieval were complete.                           Many medium-mouthed diverticula were found in the                            sigmoid colon. There was evidence of diverticular  spasm. There was no evidence of diverticular                            bleeding.                           The exam was otherwise normal throughout the                            examined colon.                           The retroflexed view of the distal rectum and anal                            verge was normal and showed no anal or rectal                            abnormalities. Complications:            No immediate complications. Estimated Blood Loss:     Estimated blood loss: none. Impression:               - One 5 mm polyp in the sigmoid colon, removed with                            a cold snare. Resected and retrieved.                           - Moderate diverticulosis in the sigmoid colon. Recommendation:           - Patient has a contact number available for                            emergencies. The signs and symptoms of potential                             delayed complications were discussed with the                            patient. Return to normal activities tomorrow.                            Written discharge instructions were provided to the                            patient.                           - Resume previous diet.                           - Continue present medications.                           - Await pathology results.                           -  No repeat colonoscopy due to age. Ladene Artist, MD 06/30/2015 11:24:07 AM This report has been signed electronically.

## 2015-06-30 NOTE — Progress Notes (Signed)
Called to room to assist during endoscopic procedure.  Patient ID and intended procedure confirmed with present staff. Received instructions for my participation in the procedure from the performing physician.  

## 2015-07-01 ENCOUNTER — Telehealth: Payer: Self-pay | Admitting: *Deleted

## 2015-07-01 NOTE — Telephone Encounter (Signed)
Left message on f/u call 

## 2015-07-09 ENCOUNTER — Encounter: Payer: Self-pay | Admitting: Gastroenterology

## 2015-09-17 DIAGNOSIS — K582 Mixed irritable bowel syndrome: Secondary | ICD-10-CM | POA: Insufficient documentation

## 2015-09-20 ENCOUNTER — Other Ambulatory Visit: Payer: Self-pay | Admitting: Family Medicine

## 2015-09-20 DIAGNOSIS — Z1231 Encounter for screening mammogram for malignant neoplasm of breast: Secondary | ICD-10-CM

## 2015-10-25 DIAGNOSIS — R918 Other nonspecific abnormal finding of lung field: Secondary | ICD-10-CM | POA: Insufficient documentation

## 2015-10-28 ENCOUNTER — Ambulatory Visit
Admission: RE | Admit: 2015-10-28 | Discharge: 2015-10-28 | Disposition: A | Discharge status: Home / Self Care | Payer: Medicare Other | Source: Ambulatory Visit | Attending: Family Medicine | Admitting: Family Medicine

## 2015-10-28 DIAGNOSIS — Z1231 Encounter for screening mammogram for malignant neoplasm of breast: Secondary | ICD-10-CM

## 2015-11-17 ENCOUNTER — Encounter: Payer: Self-pay | Admitting: Pulmonary Disease

## 2015-11-17 ENCOUNTER — Ambulatory Visit (INDEPENDENT_AMBULATORY_CARE_PROVIDER_SITE_OTHER): Payer: Medicare Other | Admitting: Pulmonary Disease

## 2015-11-17 DIAGNOSIS — R918 Other nonspecific abnormal finding of lung field: Secondary | ICD-10-CM | POA: Diagnosis not present

## 2015-11-17 DIAGNOSIS — E039 Hypothyroidism, unspecified: Secondary | ICD-10-CM | POA: Insufficient documentation

## 2015-11-17 DIAGNOSIS — M779 Enthesopathy, unspecified: Secondary | ICD-10-CM | POA: Insufficient documentation

## 2015-11-17 DIAGNOSIS — F411 Generalized anxiety disorder: Secondary | ICD-10-CM | POA: Insufficient documentation

## 2015-11-17 DIAGNOSIS — E785 Hyperlipidemia, unspecified: Secondary | ICD-10-CM | POA: Insufficient documentation

## 2015-11-17 DIAGNOSIS — J302 Other seasonal allergic rhinitis: Secondary | ICD-10-CM | POA: Insufficient documentation

## 2015-11-17 DIAGNOSIS — M8580 Other specified disorders of bone density and structure, unspecified site: Secondary | ICD-10-CM | POA: Insufficient documentation

## 2015-11-17 NOTE — Progress Notes (Signed)
Subjective:    Patient ID: Cynthia Garrett, female    DOB: 1941-02-03, 75 y.o.   MRN: [id number]  HPI She reports no prior history of lung nodules before being found to have an incidental nodule in her left lower lobe. She had a repeat CT scan approximately 1 year later that showed additional lung nodules. Reports rare coughing. Rare dyspnea or wheezing. No chest pain or pressure. No fever, chills, or sweats. Does have hot flashes. She reports no unexpected weight loss. No adenopathy in her neck, groin, or axilla. No abdominal pain, nausea, or emesis. No odynophagia or dysphagia.   Review of Systems No rashes or abnormal bruising. Intermittent headaches that are chronic. No focal vision loss, weakness, numbness, or tingling. A pertinent 14 point review of systems is negative except as per the history of presenting illness.  Allergies  Allergen Reactions  . Cholestatin   . Tetanus Toxoids Swelling    Redness at site of injection  . Nitrofurantoin Nausea And Vomiting    Current Outpatient Prescriptions on File Prior to Visit  Medication Sig Dispense Refill  . calcium citrate-vitamin D (CITRACAL+D) 315-200 MG-UNIT per tablet Take 2 tablets by mouth 2 (two) times daily.      Marland Kitchen levothyroxine (SYNTHROID, LEVOTHROID) 75 MCG tablet Take 75 mcg by mouth daily before breakfast.    . LORazepam (ATIVAN) 0.5 MG tablet Take 0.5 mg by mouth every 8 (eight) hours.    . Multiple Vitamins-Minerals (MULTIVITAMIN WITH MINERALS) tablet Take 1 tablet by mouth daily.      . norethindrone-ethinyl estradiol (JINTELI) 1-5 MG-MCG TABS tablet 1/2 tablet on Monday and 1/2 tab on friday    . pravastatin (PRAVACHOL) 40 MG tablet Take 40 mg by mouth daily.     No current facility-administered medications on file prior to visit.     Past Medical History:  Diagnosis Date  . Allergic rhinitis, seasonal   . Anxiety   . Cataract    extraction  . Headache(784.0)   . Hyperlipidemia   . Hypothyroidism   .  Osteopenia   . PONV (postoperative nausea and vomiting)   . Thyroid disease     Past Surgical History:  Procedure Laterality Date  . CATARACT EXTRACTION W/PHACO  06/26/2011   Procedure: CATARACT EXTRACTION PHACO AND INTRAOCULAR LENS PLACEMENT (IOC);  Surgeon: Tonny Branch, MD;  Location: [facility];  Service: Ophthalmology;  Laterality: Right;  CDE 10.34  . CATARACT EXTRACTION W/PHACO  07/06/2011   Procedure: CATARACT EXTRACTION PHACO AND INTRAOCULAR LENS PLACEMENT (IOC);  Surgeon: Tonny Branch, MD;  Location: [facility];  Service: Ophthalmology;  Laterality: Left;  CDE:9.92  . COLONOSCOPY    . Hysterotomy     fibroid tumor    Family History  Problem Relation Age of Onset  . Heart disease Mother   . Hypertension Mother   . Diabetes Sister   . Hypertension Sister   . COPD Brother   . Anesthesia problems Neg Hx   . Hypotension Neg Hx   . Malignant hyperthermia Neg Hx   . Pseudochol deficiency Neg Hx   . Colon cancer Neg Hx   . Cancer Neg Hx     Social History   Social History  . Marital status: Married    Spouse name: N/A  . Number of children: N/A  . Years of education: N/A   Social History Main Topics  . Smoking status: Former Smoker    Packs/day: 0.25    Years: 10.00    Types: Cigarettes  Quit date: 06/07/1973  . Smokeless tobacco: Never Used     Comment: Significant second-hand exposure.  . Alcohol use No  . Drug use: No  . Sexual activity: Yes    Birth control/ protection: Post-menopausal   Other Topics Concern  . None   Social History Narrative   Originally from [place]. Always lived in [place]. Previously has traveled from [facility] to [place], [place], [place], [place], [place], [place], & [place]. Has dog. No bird exposure. Had bats in her attic previously that have been removed. No mold or hot tub exposure. Previously worked as an Agricultural consultant in a Restaurant manager, fast food. Previously enjoyed [place]. She has been doing Tai Chi.       Objective:   Physical Exam BP 122/76 (BP Location: Left  Arm, Cuff Size: Normal)   Pulse 79   Ht 5' 2"  (1.575 m)   Wt 147 lb (66.7 kg)   SpO2 98%   BMI 26.89 kg/m  General:  Awake. Alert. No acute distress. Accompanied by husband today.  Integument:  Warm & dry. No rash on exposed skin. No bruising. Lymphatics:  No appreciated cervical or supraclavicular lymphadenoapthy. HEENT:  Moist mucus membranes. No oral ulcers. No scleral injection or icterus.  Cardiovascular:  Regular rate. No edema. No appreciable JVD.  Pulmonary:  Good aeration & clear to auscultation bilaterally. Symmetric chest wall expansion. No accessory muscle use. Abdomen: Soft. Normal bowel sounds. Nondistended. Grossly nontender. Musculoskeletal:  Normal bulk and tone. Hand grip strength 5/5 bilaterally. No joint deformity or effusion appreciated. Neurological:  CN 2-12 grossly in tact. No meningismus. Moving all 4 extremities equally. Symmetric brachioradialis deep tendon reflexes. Psychiatric:  Mood and affect congruent. Speech normal rhythm, rate & tone.   IMAGING CT CHEST W/O 10/25/15 (personally reviewed by me): No pleural effusion or thickening appreciated. No pericardial effusion. No pathologic mediastinal adenopathy. Previously noted 4 mm left lower lobe nodule not appreciated by me but reportedly stable on radiology review. Patient does have a 3 mm nodule as well as 2 additional 5 mm nodules in the right middle lobe. Radiology did a cystic lesion in the left lobe of the liver.    Assessment & Plan:  75 y.o. female incidentally found to have bilateral lung nodules. Patient's left lower lobe nodule seems to remain stable. On my review she does have 3 subcentimeter pulmonary nodules within her right middle lobe. With her personal history of tobacco use as well as significant secondhand exposure I feel that close follow-up is reasonable. Certainly it is possible she has additional exposure as a cause for scar tissue formation including her recent found exposure to bats within her  on home but in the absence of infectious symptoms I do not feel that further serum workup for infectious workup at this time is necessary. I instructed the patient to contact my office if she had any new breathing problems before her next appointment.  1. Multiple lung nodules: Repeat CT chest without contrast in 2 months. 2. Follow-up: Patient to return to clinic in 2 months after CT scan.  Sonia Baller Ashok Cordia, M.D. [facility] Pager:  [phone] After 3pm or if no response, call [phone] 3:48 PM 11/17/15

## 2015-11-17 NOTE — Patient Instructions (Signed)
   Call me if you have any questions or new breathing problems before your next appointment.  I will see you back in 2 months after your CT scan.  TESTS ORDERED: 1. CT Chest w/o in 2 months ([facility])

## 2015-12-09 ENCOUNTER — Encounter: Payer: Self-pay | Admitting: *Deleted

## 2016-01-11 ENCOUNTER — Ambulatory Visit (INDEPENDENT_AMBULATORY_CARE_PROVIDER_SITE_OTHER): Payer: Self-pay | Admitting: Orthopaedic Surgery

## 2016-02-01 ENCOUNTER — Telehealth: Payer: Self-pay | Admitting: Pulmonary Disease

## 2016-02-01 NOTE — Telephone Encounter (Signed)
Called and spoke to pt. Pt is requesting results of recent CT chest that was done at [facility]. Pt aware that JN will need to be in office to review the results on PACS system.   JN please advise. Thanks.

## 2016-02-02 NOTE — Telephone Encounter (Signed)
Pt calling to check on status of results.Cynthia Garrett

## 2016-02-04 NOTE — Telephone Encounter (Signed)
Spoke with Cynthia Garrett. She is still awaiting to hear her results from the CT she had a [facility]. Advised her that Durene Cal has not been in the office to review this. She will await our call once JN reviews this CT.

## 2016-02-14 ENCOUNTER — Encounter: Payer: Self-pay | Admitting: Pulmonary Disease

## 2016-02-14 ENCOUNTER — Ambulatory Visit (INDEPENDENT_AMBULATORY_CARE_PROVIDER_SITE_OTHER): Payer: Medicare Other | Admitting: Pulmonary Disease

## 2016-02-14 VITALS — BP 124/66 | HR 78 | Ht 62.0 in | Wt 152.0 lb

## 2016-02-14 DIAGNOSIS — R918 Other nonspecific abnormal finding of lung field: Secondary | ICD-10-CM

## 2016-02-14 NOTE — Telephone Encounter (Signed)
Patient was seen in clinic this morning, and results were reviewed with her in clinic.  Nothing further needed.

## 2016-02-14 NOTE — Telephone Encounter (Signed)
JN Please advise of results.    Reminder sent to nurse as well.

## 2016-02-14 NOTE — Progress Notes (Signed)
Subjective:    Patient ID: Cynthia Garrett, female    DOB: 02-Dec-1940, 75 y.o.   MRN: [id number]  C.C.:  Follow-up for Multiple Lung Nodules.  HPI Multiple Lung Nodules:  Has right middle lobe nodules. Largest nodule now measuring 61m and unchanged comparing recent CT scan with original CT in August 2017. No weight loss.   Review of Systems No chest pain or pressure. No fever, chills, or sweats. No abdominal pain, nausea or emesis.  Allergies  Allergen Reactions  . Cholestatin   . Tetanus Toxoids Swelling    Redness at site of injection  . Nitrofurantoin Nausea And Vomiting    Current Outpatient Prescriptions on File Prior to Visit  Medication Sig Dispense Refill  . calcium citrate-vitamin D (CITRACAL+D) 315-200 MG-UNIT per tablet Take 2 tablets by mouth 2 (two) times daily.      .Marland Kitchenlevothyroxine (SYNTHROID, LEVOTHROID) 75 MCG tablet Take 75 mcg by mouth daily before breakfast.    . LORazepam (ATIVAN) 0.5 MG tablet Take 0.5 mg by mouth every 8 (eight) hours.    . Multiple Vitamins-Minerals (MULTIVITAMIN WITH MINERALS) tablet Take 1 tablet by mouth daily.      . norethindrone-ethinyl estradiol (JINTELI) 1-5 MG-MCG TABS tablet 1/2 tablet on Monday and 1/2 tab on friday    . pravastatin (PRAVACHOL) 40 MG tablet Take 40 mg by mouth daily.    .Marland KitchenHYDROcodone-acetaminophen (NORCO/VICODIN) 5-325 MG tablet Take by mouth. 1/2 tablet every 4 hrs as needed     No current facility-administered medications on file prior to visit.     Past Medical History:  Diagnosis Date  . Allergic rhinitis, seasonal   . Anxiety   . Cataract    extraction  . Headache(784.0)   . Hyperlipidemia   . Hypothyroidism   . Osteopenia   . PONV (postoperative nausea and vomiting)   . Thyroid disease     Past Surgical History:  Procedure Laterality Date  . CATARACT EXTRACTION W/PHACO  06/26/2011   Procedure: CATARACT EXTRACTION PHACO AND INTRAOCULAR LENS PLACEMENT (IOC);  Surgeon: KTonny Branch MD;  Location:  [facility];  Service: Ophthalmology;  Laterality: Right;  CDE 10.34  . CATARACT EXTRACTION W/PHACO  07/06/2011   Procedure: CATARACT EXTRACTION PHACO AND INTRAOCULAR LENS PLACEMENT (IOC);  Surgeon: KTonny Branch MD;  Location: [facility];  Service: Ophthalmology;  Laterality: Left;  CDE:9.92  . COLONOSCOPY    . Hysterotomy     fibroid tumor    Family History  Problem Relation Age of Onset  . Heart disease Mother   . Hypertension Mother   . Diabetes Sister   . Hypertension Sister   . COPD Brother   . Anesthesia problems Neg Hx   . Hypotension Neg Hx   . Malignant hyperthermia Neg Hx   . Pseudochol deficiency Neg Hx   . Colon cancer Neg Hx   . Cancer Neg Hx     Social History   Social History  . Marital status: Married    Spouse name: N/A  . Number of children: N/A  . Years of education: N/A   Social History Main Topics  . Smoking status: Former Smoker    Packs/day: 0.25    Years: 10.00    Types: Cigarettes    Quit date: 06/07/1973  . Smokeless tobacco: Never Used     Comment: Significant second-hand exposure.  . Alcohol use No  . Drug use: No  . Sexual activity: Yes    Birth control/ protection: Post-menopausal  Other Topics Concern  . None   Social History Narrative   Originally from [place]. Always lived in [place]. Previously has traveled from [facility] to [place], [place], [place], [place], [place], [place], & [place]. Has dog. No bird exposure. Had bats in her attic previously that have been removed. No mold or hot tub exposure. Previously worked as an Agricultural consultant in a Restaurant manager, fast food. Previously enjoyed [place]. She has been doing Tai Chi.       Objective:   Physical Exam BP 124/66 (BP Location: Right Arm, Cuff Size: Normal)   Pulse 78   Ht 5' 2"  (1.575 m)   Wt 152 lb (68.9 kg)   SpO2 97%   BMI 27.80 kg/m  General:  Awake. Alert. No acute distress. Accompanied by husband today.  Integument:  Warm & dry. No rash on exposed skin. No bruising. Lymphatics:  No appreciated cervical or  supraclavicular lymphadenoapthy. HEENT:  Moist mucus membranes. No oral ulcers. No scleral injection or icterus.  Cardiovascular:  Regular rate. No edema. No appreciable JVD.  Pulmonary:  Good aeration & clear to auscultation bilaterally. Symmetric chest wall expansion. No accessory muscle use. Abdomen: Soft. Normal bowel sounds. Nondistended. Grossly nontender.  IMAGING CT CHEST W/O 01/25/16 (personally reviewed by me): 7 mm nodule within lateral segment right middle lobe & 3 mm nodule within lateral segment of right middle lobe both relatively unchanged. No pleural effusion or thickening. No pericardial effusion. No pathologic mediastinal adenopathy.  CT CHEST W/O 10/25/15 (previously reviewed by me): No pleural effusion or thickening appreciated. No pericardial effusion. No pathologic mediastinal adenopathy. Previously noted 4 mm left lower lobe nodule not appreciated by me but reportedly stable on radiology review. Patient does have a 3 mm nodule as well as 2 additional 5 mm nodules in the right middle lobe. Radiology did a cystic lesion in the left lobe of the liver.    Assessment & Plan:  75 y.o. female incidentally found to have bilateral lung nodules on CT imaging in August 2017.Largest nodule measures 7 mm and does not appear to have appreciably changed in size. Given the patient's age and risk of malignancy we will be repeating CT imaging at the 6 month interval. I will contact the patient with her CT scan result and plan for follow-up in August assuming that it is stable and repeat imaging in 12 months is needed. I instructed the patient contact my office if she had any new breathing problems or questions before next appointment.  1. Multiple lung nodules: Repeat CT chest without contrast in February 2018 given size of right middle lobe nodule. Plan for further imaging in August 2018 depending upon results of February CT scan. 2. Follow-up: Patient to return to clinic in August 2018 or  sooner if needed.  Sonia Baller Ashok Cordia, M.D. [facility] Pulmonary & Critical Care Pager:  [phone] After 3pm or if no response, call [phone] 10:48 AM 02/14/16

## 2016-02-14 NOTE — Patient Instructions (Signed)
   Call me if you have any new breathing problems before your next appointment.  Call my office if you don't hear back about your CT scan result in February after a week or so.  I will see you back in August 2018 or sooner if needed.  TESTS ORDERED: 1. CT CHEST W/O February 2018

## 2016-02-21 ENCOUNTER — Encounter: Payer: Self-pay | Admitting: Physical Therapy

## 2016-02-21 ENCOUNTER — Ambulatory Visit: Payer: Medicare Other | Attending: Neurosurgery | Admitting: Physical Therapy

## 2016-02-21 DIAGNOSIS — M5442 Lumbago with sciatica, left side: Secondary | ICD-10-CM | POA: Insufficient documentation

## 2016-02-21 NOTE — Patient Instructions (Signed)
Lower abdominal/core stability exercises  1. Practice your breathing technique: Inhale through your nose expanding your belly and rib cage. Try not to breathe into your chest. Exhale slowly and gradually out your mouth feeling a sense of softness to your body. Practice multiple times. This can be performed unlimited.  2. Finding the lower abdominals. Laying on your back with the knees bent, place your fingers just below your belly button. Using your breathing technique from above, on your exhale gently pull the belly button away from your fingertips without tensing any other muscles. Practice this 5x. Next, as you exhale, draw belly button inwards and hold onto it...then feel as if you are pulling that muscle across your pelvis like you are tightening a belt. This can be hard to do at first so be patient and practice. Do 5-10 reps 1-3 x day. Always recognize quality over quantity; if your abdominal muscles become tired you will notice you may tighten/contract other muscles. This is the time to take a break.   Practice this first laying on your back, then in sitting, progressing to standing and finally adding it to all your daily movements.   3. Finding your pelvic floor. Using the breathing technique above, when your exhale, this time draw your pelvic floor muscles up as if you were attempting to stop the flow of urination. Be careful NOT to tense any other muscles. This can be hard, BE PATIENT. Try to hold up to 10 seconds repeating 10x. Try 2x a day. Once you feel you are doing this well, add this contraction to exercise #2. First contracting your pelvic floor followed by lower abdominals.   4. Adding leg movements. Add the following leg movements to challenge your ability to keep your core stable:  1. Single leg drop outs: Laying on your back with knees bent feet flat. Inhale,  dropping one knee outward KEEPING YOUR PELVIS STILL. Exhale as you bring the leg back, simultaneously performing your lower  abdominal contraction. Do 5-10 on each leg.   2. Marching: While keeping your pelvis still, lift the right foot a few inches, put it down then lift left foot. This will mimic a march. Start slow to establish control. Once you have control you may speed it up. Do 10-20x. You MUST keep your lower abdominlas contracted while you march. Breathe naturally    3. Single leg slides: Inhale while you slowly slide one leg out keeping your pelvis still. Only slide your leg as far as you can keep your pelvis still. Exhale as you bring the leg back to the start, contracting the lower abdominals as you do that. Keep your upper body relaxed. Do 5-10 on each side.     Madelyn Flavors, PT 02/21/16 1:50 PM [facility] [address] [place], [place], [address] Phone: [phone]   Fax:  [phone]

## 2016-02-21 NOTE — Therapy (Signed)
[facility] [place], [place], [address] Phone: [phone]   Fax:  [phone]  Physical Therapy Evaluation  Patient Details  Name: Cynthia Garrett MRN: [id number] Date of Birth: 14-Apr-1940 Referring Provider: Glenna Fellows, MD  Encounter Date: 02/21/2016      PT End of Session - 02/21/16 1306    Visit Number 1   Number of Visits 12   Date for PT Re-Evaluation 04/03/16   PT Start Time [id number]   PT Stop Time 1351   PT Time Calculation (min) 45 min   Activity Tolerance Patient tolerated treatment well   Behavior During Therapy [facility] for tasks assessed/performed      Past Medical History:  Diagnosis Date  . Allergic rhinitis, seasonal   . Anxiety   . Cataract    extraction  . Headache(784.0)   . Hyperlipidemia   . Hypothyroidism   . Osteopenia   . PONV (postoperative nausea and vomiting)   . Thyroid disease     Past Surgical History:  Procedure Laterality Date  . CATARACT EXTRACTION W/PHACO  06/26/2011   Procedure: CATARACT EXTRACTION PHACO AND INTRAOCULAR LENS PLACEMENT (IOC);  Surgeon: Tonny Branch, MD;  Location: [facility];  Service: Ophthalmology;  Laterality: Right;  CDE 10.34  . CATARACT EXTRACTION W/PHACO  07/06/2011   Procedure: CATARACT EXTRACTION PHACO AND INTRAOCULAR LENS PLACEMENT (IOC);  Surgeon: Tonny Branch, MD;  Location: [facility];  Service: Ophthalmology;  Laterality: Left;  CDE:9.92  . COLONOSCOPY    . Hysterotomy     fibroid tumor    There were no vitals filed for this visit.       Subjective Assessment - 02/21/16 1317    Subjective Patient began experiencing left leg pain in June and now she intermittently has back pain.   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 Yes   Pain Score 1    Pain Location Back   Pain Orientation Right;Left   Pain Descriptors / Indicators Pressure   Pain Type Acute pain   Pain Radiating Towards LLE to heel (posterior)   Pain Onset More than a month ago   Pain Frequency Intermittent   Aggravating Factors  vacuuming, inconsistent   Pain Relieving Factors heat and ice   Effect of Pain on Daily Activities unable to yoga or anything with twisting; walking limted to half mile            [facility] PT Assessment - 02/21/16 0001      Assessment   Medical Diagnosis Acquired spondylolisthsis of lumbosacral region   Referring Provider Glenna Fellows, MD   Next MD Visit March 2018     Precautions   Precautions Back   Precaution Comments Avoid extension     Balance Screen   Has the patient fallen in the past 6 months No   Has the patient had a decrease in activity level because of a fear of falling?  No   Is the patient reluctant to leave their home because of a fear of falling?  No     Home Environment   Additional Comments Independent at home     Prior Function   Level of Independence Independent     Observation/Other Assessments   Focus on Therapeutic Outcomes (FOTO)  47% LIMITED     Posture/Postural Control   Posture/Postural  Control Postural limitations   Postural Limitations Increased lumbar lordosis;Anterior pelvic tilt     ROM / Strength   AROM / PROM / Strength AROM;Strength     AROM   Overall AROM Comments Lumbar ROM WNL     Strength   Overall Strength Comments B hip flex 4-/5, R hip ext  4-/5, L 4/5; R knee flex 4+/5, L 4-/5; B hip ABD, knee ext 5/5; TA strength baseline is supine with single leg clam.     Palpation   Spinal mobility panful with Gd I PA mobs to B lumbar L3-5   Palpation comment R gluteals, L QL,      Special Tests    Special Tests Lumbar   Lumbar Tests --  Neg slump and SLR B                           PT Education - 02/21/16 1405    Education provided Yes   Education Details HEP   Person(s) Educated Patient   Methods  Explanation;Demonstration;Handout;Verbal cues;Tactile cues   Comprehension Verbalized understanding;Returned demonstration             PT Long Term Goals - 02/21/16 1401      PT LONG TERM GOAL #1   Title I with HEP   Time 6   Period Weeks   Status New     PT LONG TERM GOAL #2   Title Patient to report decreased pain with ADLS to [id number] or less.   Time 6   Period Weeks   Status New     PT LONG TERM GOAL #3   Title Patient to report no pain in LLE.   Time 6   Period Weeks   Status New     PT LONG TERM GOAL #4   Title Patient  to demo good core strength with standing core TE without compensation.   Time 6   Period Weeks   Status New               Plan - 02/21/16 1354    Clinical Impression Statement Patient presents with low back pain with left sided radiculopathy. She has a fragmented disc (to left) putting pressure on her nerve and a spondylolisthesis at L5/S1. These conditions are limiting her activiites and restricting ADLS. She has some BLE weakness and core weakness as well.   Rehab Potential Good   PT Frequency 2x / week   PT Duration 6 weeks   PT Treatment/Interventions ADLs/Self Care Home Management;Cryotherapy;Electrical Stimulation;Moist Heat;Therapeutic exercise;Neuromuscular re-education;Patient/family education;Manual techniques;Dry needling   PT Next Visit Plan ADL modifications, progress core stengthening, HS strength and functional strengthening (squats). Modalities prn. Check L ankle eversion strength.   PT Home Exercise Plan Transverse Abdominus progressions and ADL mod for sink and vacuuming.   Consulted and Agree with Plan of Care Patient      Patient will benefit from skilled therapeutic intervention in order to improve the following deficits and impairments:  Pain, Decreased strength, Postural dysfunction  Visit Diagnosis: Acute midline low back pain with left-sided sciatica - Plan: PT plan of care cert/re-cert      G-Codes - 123456  1407    Functional Assessment Tool Used FOTO 47% LIMITED   Functional Limitation Other PT primary   Other PT Primary Current Status [id number]) At least 40 percent but less than 60 percent impaired, limited or restricted   Other PT Primary Goal Status [id number]) At least 20  percent but less than 40 percent impaired, limited or restricted       Problem List Patient Active Problem List   Diagnosis Date Noted  . Seasonal allergies 11/17/2015  . Anxiety state 11/17/2015  . Hypothyroidism 11/17/2015  . Hyperlipidemia 11/17/2015  . Osteopenia 11/17/2015  . Tendonitis 11/17/2015  . Multiple lung nodules on CT 10/25/2015   Madelyn Flavors PT 02/21/2016, 2:17 PM  [facility] [address] [place], [place], [address] Phone: [phone]   Fax:  [phone]  Name: Cynthia Garrett MRN: [id number] Date of Birth: May 27, 1940

## 2016-03-02 ENCOUNTER — Encounter: Payer: Self-pay | Admitting: Physical Therapy

## 2016-03-02 ENCOUNTER — Ambulatory Visit: Payer: Medicare Other | Admitting: Physical Therapy

## 2016-03-02 DIAGNOSIS — M5442 Lumbago with sciatica, left side: Secondary | ICD-10-CM | POA: Diagnosis not present

## 2016-03-02 NOTE — Therapy (Signed)
[facility] [place], [place], [address] Phone: [phone]   Fax:  [phone]  Physical Therapy Treatment  Patient Details  Name: Cynthia Garrett MRN: [id number] Date of Birth: 1941-01-24 Referring Provider: Glenna Fellows, MD  Encounter Date: 03/02/2016      PT End of Session - 03/02/16 1034    Visit Number 2   Number of Visits 12   Date for PT Re-Evaluation 04/03/16   PT Start Time 0945   PT Stop Time 1043   PT Time Calculation (min) 58 min   Activity Tolerance Patient tolerated treatment well   Behavior During Therapy [facility] for tasks assessed/performed      Past Medical History:  Diagnosis Date  . Allergic rhinitis, seasonal   . Anxiety   . Cataract    extraction  . Headache(784.0)   . Hyperlipidemia   . Hypothyroidism   . Osteopenia   . PONV (postoperative nausea and vomiting)   . Thyroid disease     Past Surgical History:  Procedure Laterality Date  . CATARACT EXTRACTION W/PHACO  06/26/2011   Procedure: CATARACT EXTRACTION PHACO AND INTRAOCULAR LENS PLACEMENT (IOC);  Surgeon: Tonny Branch, MD;  Location: [facility];  Service: Ophthalmology;  Laterality: Right;  CDE 10.34  . CATARACT EXTRACTION W/PHACO  07/06/2011   Procedure: CATARACT EXTRACTION PHACO AND INTRAOCULAR LENS PLACEMENT (IOC);  Surgeon: Tonny Branch, MD;  Location: [facility];  Service: Ophthalmology;  Laterality: Left;  CDE:9.92  . COLONOSCOPY    . Hysterotomy     fibroid tumor    There were no vitals filed for this visit.      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 Yes   Pain Score 1    Pain Location Back   Pain Orientation Right;Left   Pain Descriptors / Indicators Aching;Dull    Pain Type Acute pain   Pain Onset More than a month ago   Pain Frequency Intermittent   Aggravating Factors  ADL's and increased activity   Pain Relieving Factors at rest and heat                         [facility] Adult PT Treatment/Exercise - 03/02/16 0001      Exercises   Exercises Lumbar     Lumbar Exercises: Seated   Other Seated Lumbar Exercises core activation with hold x10, then with scap retractions   Other Seated Lumbar Exercises 2# reachouts and D1/D2 2x10 each     Lumbar Exercises: Supine   Ab Set 3 seconds  2x10   Glut Set 3 seconds;20 reps   Bent Knee Raise 3 seconds  2x10   Bridge 2 seconds   Bridge Limitations unable due to painful with exercise   Straight Leg Raise 3 seconds  2x10     Modalities   Modalities Electrical Stimulation;Moist Heat     Moist Heat Therapy   Number Minutes Moist Heat 15 Minutes   Moist Heat Location Lumbar Spine     Electrical Stimulation   Electrical Stimulation Location low back   Electrical Stimulation Action IFC   Electrical Stimulation Parameters 1-10hz  x29min   Electrical Stimulation Goals Pain     Manual  Therapy   Manual Therapy Myofascial release;Soft tissue mobilization   Soft tissue mobilization manual and IASTW to bil mid to low back paraspinals                       PT Long Term Goals - 03/02/16 1037      PT LONG TERM GOAL #1   Title I with HEP   Time 6   Period Weeks   Status On-going     PT LONG TERM GOAL #2   Title Patient to report decreased pain with ADLS to [id number] or less.   Period Weeks   Status On-going     PT LONG TERM GOAL #3   Title Patient to report no pain in LLE.   Time 6   Period Weeks   Status On-going     PT LONG TERM GOAL #4   Title Patient  to demo good core strength with standing core TE without compensation.   Time 6   Period Weeks   Status On-going               Plan - 03/02/16 1038    Clinical Impression Statement Patient progressing  with good understanding of core activation and posture. Patient tolerated treatment with minimal pain increase with certain exercises such as hip bridge. Educated patient on continued posture techniques and core strengthening that is low level to avoid pain at this time. Patient has increased pain with prolong activity. Patient current goals ongoing due to pain and LE symptoms.    Rehab Potential Good   PT Frequency 2x / week   PT Duration 6 weeks   PT Treatment/Interventions ADLs/Self Care Home Management;Cryotherapy;Electrical Stimulation;Moist Heat;Therapeutic exercise;Neuromuscular re-education;Patient/family education;Manual techniques;Dry needling   PT Next Visit Plan cont with POC for ADL modifications, progress core stengthening, HS strength and functional strengthening (squats). Modalities prn. Check L ankle eversion strength.   Consulted and Agree with Plan of Care Patient      Patient will benefit from skilled therapeutic intervention in order to improve the following deficits and impairments:  Pain, Decreased strength, Postural dysfunction  Visit Diagnosis: Acute midline low back pain with left-sided sciatica     Problem List Patient Active Problem List   Diagnosis Date Noted  . Seasonal allergies 11/17/2015  . Anxiety state 11/17/2015  . Hypothyroidism 11/17/2015  . Hyperlipidemia 11/17/2015  . Osteopenia 11/17/2015  . Tendonitis 11/17/2015  . Multiple lung nodules on CT 10/25/2015    Nyelle Wolfson P, PTA 03/02/2016, 10:48 AM  [facility] [place], [place], [address] Phone: [phone]   Fax:  [phone]  Name: Cynthia Garrett MRN: [id number] Date of Birth: 15-Apr-1940

## 2016-03-06 HISTORY — PX: BREAST EXCISIONAL BIOPSY: SUR124

## 2016-03-08 ENCOUNTER — Ambulatory Visit: Payer: Medicare Other | Attending: Neurosurgery | Admitting: Physical Therapy

## 2016-03-08 ENCOUNTER — Encounter: Payer: Self-pay | Admitting: Physical Therapy

## 2016-03-08 DIAGNOSIS — M5442 Lumbago with sciatica, left side: Secondary | ICD-10-CM | POA: Diagnosis present

## 2016-03-08 NOTE — Therapy (Addendum)
[facility] [place], [place], [address] Phone: [phone]   Fax:  [phone]  Physical Therapy Treatment  Patient Details  Name: Cynthia Garrett MRN: [id number] Date of Birth: [birth year] Referring Provider: Glenna Fellows, MD  Encounter Date: 03/08/2016      PT End of Session - 03/08/16 1019    Visit Number 3   Number of Visits 12   PT Start Time 0942   PT Stop Time 1038   PT Time Calculation (min) 56 min   Activity Tolerance Patient tolerated treatment well   Behavior During Therapy [facility] for tasks assessed/performed      Past Medical History:  Diagnosis Date  . Allergic rhinitis, seasonal   . Anxiety   . Cataract    extraction  . Headache(784.0)   . Hyperlipidemia   . Hypothyroidism   . Osteopenia   . PONV (postoperative nausea and vomiting)   . Thyroid disease     Past Surgical History:  Procedure Laterality Date  . CATARACT EXTRACTION W/PHACO  06/26/2011   Procedure: CATARACT EXTRACTION PHACO AND INTRAOCULAR LENS PLACEMENT (IOC);  Surgeon: Tonny Branch, MD;  Location: [facility];  Service: Ophthalmology;  Laterality: Right;  CDE 10.34  . CATARACT EXTRACTION W/PHACO  07/06/2011   Procedure: CATARACT EXTRACTION PHACO AND INTRAOCULAR LENS PLACEMENT (IOC);  Surgeon: Tonny Branch, MD;  Location: [facility];  Service: Ophthalmology;  Laterality: Left;  CDE:9.92  . COLONOSCOPY    . Hysterotomy     fibroid tumor    There were no vitals filed for this visit.      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 Yes   Pain Score 1    Pain Location Back   Pain Orientation Right;Left   Pain Descriptors / Indicators  Sore   Pain Type Acute pain   Pain Onset More than a month ago   Pain Frequency Intermittent   Aggravating Factors  increased activity   Pain Relieving Factors at rest                         [facility] Adult PT Treatment/Exercise - 03/08/16 0001      Lumbar Exercises: Aerobic   Stationary Bike nustep L4 x100mn UE/LE activity posture focus     Lumbar Exercises: Standing   Other Standing Lumbar Exercises draw ins with rows/ext using pink XTS 2x10 each   Other Standing Lumbar Exercises 6" step ups 2x10 each     Lumbar Exercises: Seated   Sit to Stand Other (comment)  2x10   Other Seated Lumbar Exercises HS curls with green t-band x15 each   Other Seated Lumbar Exercises standing 2# reach outs 2x10     Lumbar Exercises: Supine   Bent Knee Raise 3 seconds  2x10 with 2# ball overhead to opp knee   Bridge 10 reps;3 seconds  with green t-band hip abd x10   Straight Leg Raise 3 seconds  2x20     Moist Heat Therapy   Number Minutes Moist Heat 15 Minutes   Moist Heat Location Lumbar Spine     Electrical Stimulation   Electrical  Stimulation Location low back 80-150hx x40mn   Electrical Stimulation Goals Pain                     PT Long Term Goals - 03/08/16 1020      PT LONG TERM GOAL #1   Title I with HEP   Time 6   Period Weeks   Status Achieved     PT LONG TERM GOAL #2   Title Patient to report decreased pain with ADLS to 16/25or less.   Time 6   Period Weeks   Status On-going     PT LONG TERM GOAL #3   Title Patient to report no pain in LLE.   Time 6   Period Weeks   Status Partially Met  symptoms in right side     PT LONG TERM GOAL #4   Title Patient  to demo good core strength with standing core TE without compensation.   Time 6   Period Weeks   Status On-going               Plan - 03/08/16 1020    Clinical Impression Statement Patient tolerated treatment well today. Patient reported doing HEP and hup bridges daily  with no difficulty. Patient feels less pain overall and able to complete ADL's with greater ease. Patient has reported no symptoms in LLE yet some soreness and numbness in right LE today. Patient met LTG #1 today other songoing due to ongoing symptoms.    Rehab Potential Good   PT Frequency 2x / week   PT Duration 6 weeks   PT Treatment/Interventions ADLs/Self Care Home Management;Cryotherapy;Electrical Stimulation;Moist Heat;Therapeutic exercise;Neuromuscular re-education;Patient/family education;Manual techniques;Dry needling   PT Next Visit Plan patient will be on hold after today per her husband having surgery   Consulted and Agree with Plan of Care Patient      Patient will benefit from skilled therapeutic intervention in order to improve the following deficits and impairments:  Pain, Decreased strength, Postural dysfunction  Visit Diagnosis: Acute midline low back pain with left-sided sciatica     Problem List Patient Active Problem List   Diagnosis Date Noted  . Seasonal allergies 11/17/2015  . Anxiety state 11/17/2015  . Hypothyroidism 11/17/2015  . Hyperlipidemia 11/17/2015  . Osteopenia 11/17/2015  . Tendonitis 11/17/2015  . Multiple lung nodules on CT 10/25/2015    Larie Mathes P, PTA 03/08/2016, 10:46 AM  [facility] [address]Phone: [phone]  Fax:  [phone] Name: GKIFFANY SCHELLINGMRN: [id number] of Birth: [birth year] PHYSICAL THERAPY DISCHARGE SUMMARY  Visits from Start of Care: 3.  Current functional level related to goals / functional outcomes: See above.   Remaining deficits: Low pain-level.   Education / Equipment: HEP. Plan: Patient agrees to discharge.  Patient goals were partially met. Patient is being discharged due to not returning since the last visit.  ?????         CMaliApplegate MPT

## 2016-04-27 ENCOUNTER — Ambulatory Visit (INDEPENDENT_AMBULATORY_CARE_PROVIDER_SITE_OTHER)
Admission: RE | Admit: 2016-04-27 | Discharge: 2016-04-27 | Disposition: A | Discharge status: Home / Self Care | Payer: Medicare Other | Source: Ambulatory Visit | Attending: Pulmonary Disease | Admitting: Pulmonary Disease

## 2016-04-27 DIAGNOSIS — R918 Other nonspecific abnormal finding of lung field: Secondary | ICD-10-CM

## 2016-05-02 ENCOUNTER — Telehealth: Payer: Self-pay | Admitting: Pulmonary Disease

## 2016-05-02 DIAGNOSIS — R911 Solitary pulmonary nodule: Secondary | ICD-10-CM

## 2016-05-02 NOTE — Telephone Encounter (Signed)
Spoke with pt. She is requesting her CT results from 04/27/16.  JN - please advise. Thanks.

## 2016-05-04 NOTE — Telephone Encounter (Signed)
Pt is aware of results. Order for repeat CT has been entered. Nothing further was needed.

## 2016-05-04 NOTE — Telephone Encounter (Signed)
Please let the patient know that her lung nodules have not changed in size and I do not see any new ones. Can you please order a CT Chest w/o contrast for August 2018. She should have an appointment with me the same month to review the results. Thanks.

## 2016-09-07 ENCOUNTER — Other Ambulatory Visit: Payer: Self-pay | Admitting: Family Medicine

## 2016-09-07 DIAGNOSIS — Z1231 Encounter for screening mammogram for malignant neoplasm of breast: Secondary | ICD-10-CM

## 2016-10-18 ENCOUNTER — Ambulatory Visit (INDEPENDENT_AMBULATORY_CARE_PROVIDER_SITE_OTHER)
Admission: RE | Admit: 2016-10-18 | Discharge: 2016-10-18 | Disposition: A | Discharge status: Home / Self Care | Payer: Medicare Other | Source: Ambulatory Visit | Attending: Pulmonary Disease | Admitting: Pulmonary Disease

## 2016-10-18 DIAGNOSIS — R911 Solitary pulmonary nodule: Secondary | ICD-10-CM

## 2016-10-24 NOTE — Progress Notes (Signed)
Subjective:    Patient ID: Cynthia Garrett, female    DOB: 10-17-1940, 76 y.o.   MRN: [id number]  C.C.:  Follow-up for Multiple Lung Nodules & Chronic Seasonal Allergic Rhinitis.  HPI Multiple lung nodules: Original CT imaging August 2017. Repeat CT imaging showed nodules primarily within right middle lobe & largest nodule measuring 7 mm. Repeat CT imaging this month demonstrates no progression of lung nodules.Denies any chest pain or pressure.  Chronic seasonal allergic rhinitis:  Patient reports she uses intermittent over-the-counter antihistamine, generic Claritin. Reports no sinus congestion or pressure at this time.  Review of Systems Very infrequent cough. No dyspnea or wheezing. No lymphadenopathy in her neck, groin, or axilla. No subjective fever, chills, or sweats. No abdominal pain or nausea.  Allergies  Allergen Reactions  . Cholestatin   . Tetanus Toxoids Swelling    Redness at site of injection  . Nitrofurantoin Nausea And Vomiting    Current Outpatient Prescriptions on File Prior to Visit  Medication Sig Dispense Refill  . calcium citrate-vitamin D (CITRACAL+D) 315-200 MG-UNIT per tablet Take 2 tablets by mouth 2 (two) times daily.      Marland Kitchen levothyroxine (SYNTHROID, LEVOTHROID) 75 MCG tablet Take 75 mcg by mouth daily before breakfast.    . LORazepam (ATIVAN) 0.5 MG tablet Take 0.5 mg by mouth every 8 (eight) hours.    . Multiple Vitamins-Minerals (MULTIVITAMIN WITH MINERALS) tablet Take 1 tablet by mouth daily.      . norethindrone-ethinyl estradiol (JINTELI) 1-5 MG-MCG TABS tablet 1/2 tablet on Monday and 1/2 tab on friday    . pravastatin (PRAVACHOL) 40 MG tablet Take 40 mg by mouth daily.     No current facility-administered medications on file prior to visit.     Past Medical History:  Diagnosis Date  . Allergic rhinitis, seasonal   . Anxiety   . Cataract    extraction  . Headache(784.0)   . Hyperlipidemia   . Hypothyroidism   . Osteopenia   . PONV  (postoperative nausea and vomiting)   . Thyroid disease     Past Surgical History:  Procedure Laterality Date  . CATARACT EXTRACTION W/PHACO  06/26/2011   Procedure: CATARACT EXTRACTION PHACO AND INTRAOCULAR LENS PLACEMENT (IOC);  Surgeon: Tonny Branch, MD;  Location: [facility];  Service: Ophthalmology;  Laterality: Right;  CDE 10.34  . CATARACT EXTRACTION W/PHACO  07/06/2011   Procedure: CATARACT EXTRACTION PHACO AND INTRAOCULAR LENS PLACEMENT (IOC);  Surgeon: Tonny Branch, MD;  Location: [facility];  Service: Ophthalmology;  Laterality: Left;  CDE:9.92  . COLONOSCOPY    . Hysterotomy     fibroid tumor    Family History  Problem Relation Age of Onset  . Heart disease Mother   . Hypertension Mother   . Diabetes Sister   . Hypertension Sister   . COPD Brother   . Anesthesia problems Neg Hx   . Hypotension Neg Hx   . Malignant hyperthermia Neg Hx   . Pseudochol deficiency Neg Hx   . Colon cancer Neg Hx   . Cancer Neg Hx     Social History   Social History  . Marital status: Married    Spouse name: N/A  . Number of children: N/A  . Years of education: N/A   Social History Main Topics  . Smoking status: Former Smoker    Packs/day: 0.25    Years: 10.00    Types: Cigarettes    Quit date: 06/07/1973  . Smokeless tobacco: Never Used  Comment: Significant second-hand exposure.  . Alcohol use No  . Drug use: No  . Sexual activity: Yes    Birth control/ protection: Post-menopausal   Other Topics Concern  . None   Social History Narrative   Originally from [place]. Always lived in [place]. Previously has traveled from [facility] to [place], [place], [place], [place], [place], [place], & [place]. Has dog. No bird exposure. Had bats in her attic previously that have been removed. No mold or hot tub exposure. Previously worked as an Agricultural consultant in a Restaurant manager, fast food. Previously enjoyed [employer]. She has been doing Tai Chi.       Objective:   Physical Exam BP 116/68 (BP Location: Left Arm, Cuff Size:  Normal)   Pulse 79   Ht 5' 2"  (1.575 m)   Wt 150 lb 12.8 oz (68.4 kg)   SpO2 97%   BMI 27.58 kg/m   General:  Husband with her today. No distress. Awake. Integument:  Warm & dry. No rash on exposed skin. No bruising on exposed skin. Extremities:  No cyanosis or clubbing.  HEENT:  Moist mucus membranes. No nasal turbinate swelling. No scleral injection. Cardiovascular:  Regular rate. No edema. Normal S1 & S2.  Pulmonary:  Clear to auscultation bilaterally. Normal work of breathing on room air. Abdomen: Soft. Normal bowel sounds. Nondistended.  Musculoskeletal:  Normal bulk and tone. No joint deformity or effusion appreciated.  IMAGING CT CHEST W/O 10/18/16 (personally reviewed by me):  Bilateral subcentimeter pulmonary nodules. 2 mm, 6 mm, & 5 mm nodule seen within right middle lobe. 4 mm left lower lobe & 3 mm left costophrenic sulcus nodule noted. Tiny left upper lobe nodule. No obvious progression of August nodules. Probable left liver cyst. No pathologic mediastinal adenopathy. No pericardial effusion. No pleural effusion or thickening.  CT CHEST W/O 01/25/16 (previously reviewed by me): 7 mm nodule within lateral segment right middle lobe & 3 mm nodule within lateral segment of right middle lobe both relatively unchanged. No pleural effusion or thickening. No pericardial effusion. No pathologic mediastinal adenopathy.  CT CHEST W/O 10/25/15 (previously reviewed by me): No pleural effusion or thickening appreciated. No pericardial effusion. No pathologic mediastinal adenopathy. Previously noted 4 mm left lower lobe nodule not appreciated by me but reportedly stable on radiology review. Patient does have a 3 mm nodule as well as 2 additional 5 mm nodules in the right middle lobe. Radiology did a cystic lesion in the left lobe of the liver.    Assessment & Plan:  76 y.o. female found to have multiple lung nodules on CT imaging in August 2017. Intermittent cough likely due to postnasal  drainage. Patient has no symptoms at this time. Reviewed her chest CT scan which shows continued stability. Given her significant secondhand smoke exposure she is in a low to moderate risk category for lung cancer progression. As such, we will be continuing with repeat imaging in 1 year as recommended. I instructed the patient contact me if she had any new problems with her breathing or questions before her next appointment.  1. Lung nodules: Plan for repeat CT imaging August 2019. 2. Chronic seasonal allergic rhinitis: Continuing to use over-the-counter antihistamine medications as needed. 3. Follow-up: Return to clinic in 1 year or sooner if needed.  Sonia Baller Ashok Cordia, M.D. [facility] Pager:  [phone] After 3pm or if no response, call [phone] 9:12 AM 10/26/16

## 2016-10-26 ENCOUNTER — Encounter: Payer: Self-pay | Admitting: Pulmonary Disease

## 2016-10-26 ENCOUNTER — Ambulatory Visit (INDEPENDENT_AMBULATORY_CARE_PROVIDER_SITE_OTHER): Payer: Medicare Other | Admitting: Pulmonary Disease

## 2016-10-26 VITALS — BP 116/68 | HR 79 | Ht 62.0 in | Wt 150.8 lb

## 2016-10-26 DIAGNOSIS — J302 Other seasonal allergic rhinitis: Secondary | ICD-10-CM | POA: Diagnosis not present

## 2016-10-26 DIAGNOSIS — R918 Other nonspecific abnormal finding of lung field: Secondary | ICD-10-CM

## 2016-10-26 NOTE — Patient Instructions (Addendum)
   Please call me if you have any new breathing problems or questions before your next appointment.  We will see you back around the time of your next CT scan or sooner if needed.   IMAGING 1. CT CHEST W/O AUGUST 2019

## 2016-10-30 ENCOUNTER — Ambulatory Visit
Admission: RE | Admit: 2016-10-30 | Discharge: 2016-10-30 | Disposition: A | Discharge status: Home / Self Care | Payer: Medicare Other | Source: Ambulatory Visit | Attending: Family Medicine | Admitting: Family Medicine

## 2016-10-30 DIAGNOSIS — Z1231 Encounter for screening mammogram for malignant neoplasm of breast: Secondary | ICD-10-CM

## 2016-10-31 ENCOUNTER — Other Ambulatory Visit: Payer: Self-pay | Admitting: Family Medicine

## 2016-10-31 DIAGNOSIS — R928 Other abnormal and inconclusive findings on diagnostic imaging of breast: Secondary | ICD-10-CM

## 2016-11-02 ENCOUNTER — Ambulatory Visit
Admission: RE | Admit: 2016-11-02 | Discharge: 2016-11-02 | Disposition: A | Discharge status: Home / Self Care | Payer: Medicare Other | Source: Ambulatory Visit | Attending: Family Medicine | Admitting: Family Medicine

## 2016-11-02 ENCOUNTER — Other Ambulatory Visit: Payer: Self-pay | Admitting: Family Medicine

## 2016-11-02 DIAGNOSIS — N6489 Other specified disorders of breast: Secondary | ICD-10-CM

## 2016-11-02 DIAGNOSIS — R928 Other abnormal and inconclusive findings on diagnostic imaging of breast: Secondary | ICD-10-CM

## 2016-11-03 ENCOUNTER — Ambulatory Visit
Admission: RE | Admit: 2016-11-03 | Discharge: 2016-11-03 | Disposition: A | Discharge status: Home / Self Care | Payer: Medicare Other | Source: Ambulatory Visit | Attending: Family Medicine | Admitting: Family Medicine

## 2016-11-03 ENCOUNTER — Other Ambulatory Visit: Payer: Self-pay | Admitting: Family Medicine

## 2016-11-03 DIAGNOSIS — N6489 Other specified disorders of breast: Secondary | ICD-10-CM

## 2016-11-24 ENCOUNTER — Other Ambulatory Visit: Payer: Self-pay | Admitting: General Surgery

## 2016-11-27 ENCOUNTER — Other Ambulatory Visit: Payer: Self-pay | Admitting: General Surgery

## 2016-11-27 DIAGNOSIS — N6092 Unspecified benign mammary dysplasia of left breast: Secondary | ICD-10-CM

## 2016-11-28 ENCOUNTER — Other Ambulatory Visit: Payer: Self-pay | Admitting: General Surgery

## 2016-11-28 DIAGNOSIS — N6092 Unspecified benign mammary dysplasia of left breast: Secondary | ICD-10-CM

## 2016-11-29 ENCOUNTER — Encounter (HOSPITAL_BASED_OUTPATIENT_CLINIC_OR_DEPARTMENT_OTHER): Payer: Self-pay | Admitting: *Deleted

## 2016-11-30 ENCOUNTER — Encounter (HOSPITAL_BASED_OUTPATIENT_CLINIC_OR_DEPARTMENT_OTHER)
Admission: RE | Admit: 2016-11-30 | Discharge: 2016-11-30 | Disposition: A | Discharge status: Home / Self Care | Payer: Medicare Other | Source: Ambulatory Visit | Attending: General Surgery | Admitting: General Surgery

## 2016-11-30 DIAGNOSIS — N6022 Fibroadenosis of left breast: Secondary | ICD-10-CM | POA: Diagnosis not present

## 2016-11-30 DIAGNOSIS — F419 Anxiety disorder, unspecified: Secondary | ICD-10-CM | POA: Diagnosis not present

## 2016-11-30 DIAGNOSIS — E785 Hyperlipidemia, unspecified: Secondary | ICD-10-CM | POA: Diagnosis not present

## 2016-11-30 DIAGNOSIS — E039 Hypothyroidism, unspecified: Secondary | ICD-10-CM | POA: Diagnosis not present

## 2016-11-30 DIAGNOSIS — E7800 Pure hypercholesterolemia, unspecified: Secondary | ICD-10-CM | POA: Diagnosis not present

## 2016-11-30 DIAGNOSIS — Z79899 Other long term (current) drug therapy: Secondary | ICD-10-CM | POA: Diagnosis not present

## 2016-11-30 DIAGNOSIS — Z87891 Personal history of nicotine dependence: Secondary | ICD-10-CM | POA: Diagnosis not present

## 2016-11-30 DIAGNOSIS — Z8249 Family history of ischemic heart disease and other diseases of the circulatory system: Secondary | ICD-10-CM | POA: Diagnosis not present

## 2016-11-30 DIAGNOSIS — Z833 Family history of diabetes mellitus: Secondary | ICD-10-CM | POA: Diagnosis not present

## 2016-11-30 DIAGNOSIS — R51 Headache: Secondary | ICD-10-CM | POA: Diagnosis not present

## 2016-11-30 DIAGNOSIS — N62 Hypertrophy of breast: Secondary | ICD-10-CM | POA: Diagnosis not present

## 2016-11-30 DIAGNOSIS — M8580 Other specified disorders of bone density and structure, unspecified site: Secondary | ICD-10-CM | POA: Diagnosis not present

## 2016-11-30 DIAGNOSIS — Z86010 Personal history of colonic polyps: Secondary | ICD-10-CM | POA: Diagnosis not present

## 2016-11-30 LAB — COMPREHENSIVE METABOLIC PANEL
ALBUMIN: 4.1 g/dL (ref 3.5–5.0)
ALK PHOS: 70 U/L (ref 38–126)
ALT: 34 U/L (ref 14–54)
AST: 37 U/L (ref 15–41)
Anion gap: 12 (ref 5–15)
BILIRUBIN TOTAL: 0.7 mg/dL (ref 0.3–1.2)
BUN: 13 mg/dL (ref 6–20)
CALCIUM: 9.7 mg/dL (ref 8.9–10.3)
CO2: 28 mmol/L (ref 22–32)
Chloride: 99 mmol/L — ABNORMAL LOW (ref 101–111)
Creatinine, Ser: 0.73 mg/dL (ref 0.44–1.00)
GFR calc Af Amer: >60 mL/min (ref >60)
GFR calc non Af Amer: >60 mL/min (ref >60)
GLUCOSE: 102 mg/dL — AB (ref 65–99)
POTASSIUM: 4.1 mmol/L (ref 3.5–5.1)
SODIUM: 139 mmol/L (ref 135–145)
Total Protein: 7.7 g/dL (ref 6.5–8.1)

## 2016-11-30 LAB — CBC WITH DIFFERENTIAL/PLATELET
BASOS ABS: 0 10*3/uL (ref 0.0–0.1)
BASOS PCT: 1 %
EOS PCT: 2 %
Eosinophils Absolute: 0.1 10*3/uL (ref 0.0–0.7)
HCT: 41.9 % (ref 36.0–46.0)
Hemoglobin: 13.9 g/dL (ref 12.0–15.0)
Lymphocytes Relative: 37 %
Lymphs Abs: 2.3 10*3/uL (ref 0.7–4.0)
MCH: 29.8 pg (ref 26.0–34.0)
MCHC: 33.2 g/dL (ref 30.0–36.0)
MCV: 89.7 fL (ref 78.0–100.0)
MONO ABS: 0.4 10*3/uL (ref 0.1–1.0)
Monocytes Relative: 7 %
Neutro Abs: 3.4 10*3/uL (ref 1.7–7.7)
Neutrophils Relative %: 53 %
PLATELETS: 261 10*3/uL (ref 150–400)
RBC: 4.67 MIL/uL (ref 3.87–5.11)
RDW: 13 % (ref 11.5–15.5)
WBC: 6.2 10*3/uL (ref 4.0–10.5)

## 2016-11-30 NOTE — Progress Notes (Signed)
Ensure pre surgery drink given with instructions to complete by [place], pt verbalized understanding.

## 2016-12-02 NOTE — H&P (Signed)
Cynthia Garrett Location: [facility] Patient #: [id number] DOB: [birth year] Married / Language: English / Race: White Female       History of Present Illness      The patient is a 76 year old female who presents with a breast mass. This is a very pleasant 76 year old woman from [place], [place]. She is here with her husband to discuss an abnormal mammogram of the left breast and a biopsy which showed atypical lobular hyperplasia. Dr. Edrick Oh is her PCP. She was referred by Dr. Autumn Patty at the [facility].     She has no prior history of breast problems. She was taking hormonal replacement therapy but recently stopped that. She gets annual screening mammograms. Recent imaging studies showed a focal area of distortion in the left breast superiorly. nothing was seen on ultrasound. The axilla was negative. Image guided biopsy shows fibrocystic changes, PA SH, and atypical lobular hyperplasia. The radiologist recommended excision. She will just to do that to be sure     Past history reveals no prior breast problems. She quit estrogen replacement therapy this year. She has lung nodules and is being followed bilateral pulmonary but apparently these are very stable. Hypothyroidism. Hyperlipidemia. Basically healthy. Family history is negative for breast cancer, ovarian cancer, or colon cancer. Mother died of congestive heart failure. Father's cause of death is unknown Social history reveals she is married. They have 2 grown children. Retired. Denies tobacco. Alcohol use rare.     She will be scheduled for left breast lumpectomy with radioactive seed localization. I told her that that was at least a 5% and maybe as high as a 10% chance that she could have an early breast cancer right now. Most likely she does not have breast cancer. She does not want to take a chance. I have discussed the indications, details, techniques, and numerous risk of the surgery with them both.  They are aware of the risk of bleeding, infection, nerve damage, chronic pain, cosmetic deformity, reoperation if cancer. She understands these issues well. All of her questions were answered. She agrees with this plan.    Past Surgical History  Cataract Surgery  Bilateral. Colon Polyp Removal - Colonoscopy   Diagnostic Studies History  Colonoscopy  1-5 years ago Mammogram  within last year Pap Smear  1-5 years ago  Allergies  No Known Drug Allergies  Allergies Reconciled   Medication History  LORazepam (0.5MG  Tablet, Oral) Active. Amitriptyline HCl (25MG  Tablet, Oral) Active. Pravastatin Sodium (40MG  Tablet, Oral) Active. Synthroid (75MCG Tablet, Oral) Active. Medications Reconciled  Social History  Caffeine use  Coffee, Tea. No alcohol use  No drug use  Tobacco use  Former smoker.  Family History  Diabetes Mellitus  Sister. Heart disease in female family member before age 1   Pregnancy / Birth History  Age at menarche  2 years. Age of menopause  51-55 Contraceptive History  Oral contraceptives. Gravida  2 Maternal age  81-30 Para  2  Other Problems  Anxiety Disorder  Hypercholesterolemia  Lump In Breast  Thyroid Disease  Umbilical Hernia Repair     Review of Systems  General Present- Night Sweats. Not Present- Appetite Loss, Chills, Fatigue, Fever, Weight Gain and Weight Loss. HEENT Present- Ringing in the Ears, Seasonal Allergies and Wears glasses/contact lenses. Not Present- Earache, Hearing Loss, Hoarseness, Nose Bleed, Oral Ulcers, Sinus Pain, Sore Throat, Visual Disturbances and Yellow Eyes. Breast Not Present- Breast Mass, Breast Pain, Nipple Discharge and Skin Changes. Cardiovascular Not Present-  Chest Pain, Difficulty Breathing Lying Down, Leg Cramps, Palpitations, Rapid Heart Rate, Shortness of Breath and Swelling of Extremities. Gastrointestinal Not Present- Abdominal Pain, Bloating, Bloody Stool, Change in Bowel  Habits, Chronic diarrhea, Constipation, Difficulty Swallowing, Excessive gas, Gets full quickly at meals, Hemorrhoids, Indigestion, Nausea, Rectal Pain and Vomiting. Female Genitourinary Not Present- Frequency, Nocturia, Painful Urination, Pelvic Pain and Urgency. Musculoskeletal Present- Joint Stiffness. Not Present- Back Pain, Joint Pain, Muscle Pain, Muscle Weakness and Swelling of Extremities. Neurological Not Present- Decreased Memory, Fainting, Headaches, Numbness, Seizures, Tingling, Tremor, Trouble walking and Weakness. Endocrine Present- Hot flashes. Not Present- Cold Intolerance, Excessive Hunger, Hair Changes, Heat Intolerance and New Diabetes. Hematology Not Present- Blood Thinners, Easy Bruising, Excessive bleeding, Gland problems, HIV and Persistent Infections.  Vitals  Weight: 150.8 lb Height: 62in Body Surface Area: 1.7 m Body Mass Index: 27.58 kg/m  Temp.: 97.35F  Pulse: 92 (Regular)  P.OX: 94% (Room air) BP: 136/78 (Sitting, Left Arm, Standard)    Physical Exam  General Mental Status-Alert. General Appearance-Consistent with stated age. Hydration-Well hydrated. Voice-Normal.  Head and Neck Head-normocephalic, atraumatic with no lesions or palpable masses. Trachea-midline. Thyroid Gland Characteristics - normal size and consistency.  Eye Eyeball - Bilateral-Extraocular movements intact. Sclera/Conjunctiva - Bilateral-No scleral icterus.  Chest and Lung Exam Chest and lung exam reveals -quiet, even and easy respiratory effort with no use of accessory muscles and on auscultation, normal breath sounds, no adventitious sounds and normal vocal resonance. Inspection Chest Wall - Normal. Back - normal.  Breast Note: Breasts are large. Biopsy site upper outer quadrant left breast. No hematoma. No ecchymoses. No mass in either breast. Nipples looked normal. No axillary adenopathy.   Cardiovascular Cardiovascular examination reveals  -normal heart sounds, regular rate and rhythm with no murmurs and normal pedal pulses bilaterally.  Abdomen Inspection Inspection of the abdomen reveals - No Hernias. Skin - Scar - no surgical scars. Palpation/Percussion Palpation and Percussion of the abdomen reveal - Soft, Non Tender, No Rebound tenderness, No Rigidity (guarding) and No hepatosplenomegaly. Auscultation Auscultation of the abdomen reveals - Bowel sounds normal.  Neurologic Neurologic evaluation reveals -alert and oriented x 3 with no impairment of recent or remote memory. Mental Status-Normal.  Musculoskeletal Normal Exam - Left-Upper Extremity Strength Normal and Lower Extremity Strength Normal. Normal Exam - Right-Upper Extremity Strength Normal and Lower Extremity Strength Normal.  Lymphatic Head & Neck  General Head & Neck Lymphatics: Bilateral - Description - Normal. Axillary  General Axillary Region: Bilateral - Description - Normal. Tenderness - Non Tender. Femoral & Inguinal  Generalized Femoral & Inguinal Lymphatics: Bilateral - Description - Normal. Tenderness - Non Tender.    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 This appearance under the microscope predicts a 5-10% chance of U having an early cancer right now Excision of this area is recommended  You agree with this and he will be scheduled for left breast lumpectomy with radioactive seed localization We have discussed the indications, techniques, and risk of the surgery in detail with you and your husband . HYPERLIPIDEMIA, ACQUIRED (E78.5) HYPOTHYROIDISM, ADULT (E03.9)   Rayon Mcchristian M. Dalbert Batman, M.D., [facility] General and Minimally invasive Surgery Breast and Colorectal  Surgery Office:   [phone] Pager:   [phone]

## 2016-12-06 ENCOUNTER — Ambulatory Visit (HOSPITAL_BASED_OUTPATIENT_CLINIC_OR_DEPARTMENT_OTHER): Payer: Medicare Other | Admitting: Anesthesiology

## 2016-12-06 ENCOUNTER — Ambulatory Visit
Admission: RE | Admit: 2016-12-06 | Discharge: 2016-12-06 | Disposition: A | Discharge status: Home / Self Care | Payer: Medicare Other | Source: Ambulatory Visit | Attending: General Surgery | Admitting: General Surgery

## 2016-12-06 ENCOUNTER — Encounter (HOSPITAL_BASED_OUTPATIENT_CLINIC_OR_DEPARTMENT_OTHER)
Admission: RE | Disposition: A | Discharge status: Home / Self Care | Payer: Self-pay | Source: Ambulatory Visit | Attending: General Surgery

## 2016-12-06 ENCOUNTER — Encounter (HOSPITAL_BASED_OUTPATIENT_CLINIC_OR_DEPARTMENT_OTHER): Payer: Self-pay | Admitting: Emergency Medicine

## 2016-12-06 ENCOUNTER — Ambulatory Visit (HOSPITAL_BASED_OUTPATIENT_CLINIC_OR_DEPARTMENT_OTHER)
Admission: RE | Admit: 2016-12-06 | Discharge: 2016-12-06 | Disposition: A | Discharge status: Home / Self Care | Payer: Medicare Other | Source: Ambulatory Visit | Attending: General Surgery | Admitting: General Surgery

## 2016-12-06 DIAGNOSIS — N6092 Unspecified benign mammary dysplasia of left breast: Secondary | ICD-10-CM

## 2016-12-06 DIAGNOSIS — E039 Hypothyroidism, unspecified: Secondary | ICD-10-CM | POA: Insufficient documentation

## 2016-12-06 DIAGNOSIS — N62 Hypertrophy of breast: Secondary | ICD-10-CM | POA: Diagnosis not present

## 2016-12-06 DIAGNOSIS — R51 Headache: Secondary | ICD-10-CM | POA: Insufficient documentation

## 2016-12-06 DIAGNOSIS — Z833 Family history of diabetes mellitus: Secondary | ICD-10-CM | POA: Insufficient documentation

## 2016-12-06 DIAGNOSIS — E785 Hyperlipidemia, unspecified: Secondary | ICD-10-CM | POA: Insufficient documentation

## 2016-12-06 DIAGNOSIS — F419 Anxiety disorder, unspecified: Secondary | ICD-10-CM | POA: Insufficient documentation

## 2016-12-06 DIAGNOSIS — E7800 Pure hypercholesterolemia, unspecified: Secondary | ICD-10-CM | POA: Insufficient documentation

## 2016-12-06 DIAGNOSIS — Z87891 Personal history of nicotine dependence: Secondary | ICD-10-CM | POA: Insufficient documentation

## 2016-12-06 DIAGNOSIS — M8580 Other specified disorders of bone density and structure, unspecified site: Secondary | ICD-10-CM | POA: Insufficient documentation

## 2016-12-06 DIAGNOSIS — Z86010 Personal history of colonic polyps: Secondary | ICD-10-CM | POA: Insufficient documentation

## 2016-12-06 DIAGNOSIS — N6022 Fibroadenosis of left breast: Secondary | ICD-10-CM | POA: Insufficient documentation

## 2016-12-06 DIAGNOSIS — Z8249 Family history of ischemic heart disease and other diseases of the circulatory system: Secondary | ICD-10-CM | POA: Insufficient documentation

## 2016-12-06 DIAGNOSIS — Z79899 Other long term (current) drug therapy: Secondary | ICD-10-CM | POA: Insufficient documentation

## 2016-12-06 HISTORY — PX: BREAST LUMPECTOMY WITH RADIOACTIVE SEED LOCALIZATION: SHX6424

## 2016-12-06 HISTORY — DX: Unspecified lump in the left breast, unspecified quadrant: N63.20

## 2016-12-06 HISTORY — DX: Other nonspecific abnormal finding of lung field: R91.8

## 2016-12-06 SURGERY — BREAST LUMPECTOMY WITH RADIOACTIVE SEED LOCALIZATION
Anesthesia: General | Site: Breast | Laterality: Left

## 2016-12-06 MED ORDER — LIDOCAINE 2% (20 MG/ML) 5 ML SYRINGE
INTRAMUSCULAR | Status: AC
  Filled 2016-12-06: qty 5

## 2016-12-06 MED ORDER — CHLORHEXIDINE GLUCONATE CLOTH 2 % EX PADS: 6.0000 | MEDICATED_PAD | Freq: Once | CUTANEOUS | Status: DC

## 2016-12-06 MED ORDER — HYDROCODONE-ACETAMINOPHEN 5-325 MG PO TABS: 1.0000 | ORAL_TABLET | Freq: Four times a day (QID) | ORAL | 0 refills | Status: DC | PRN

## 2016-12-06 MED ORDER — DEXAMETHASONE SODIUM PHOSPHATE 10 MG/ML IJ SOLN
INTRAMUSCULAR | Status: AC
  Filled 2016-12-06: qty 1

## 2016-12-06 MED ORDER — LACTATED RINGERS IV SOLN
INTRAVENOUS | Status: DC
  Administered 2016-12-06: 12:00:00 via INTRAVENOUS

## 2016-12-06 MED ORDER — LIDOCAINE 2% (20 MG/ML) 5 ML SYRINGE
INTRAMUSCULAR | Status: DC | PRN
  Administered 2016-12-06: 60 mg via INTRAVENOUS

## 2016-12-06 MED ORDER — MIDAZOLAM HCL 2 MG/2ML IJ SOLN: 1.0000 mg | INTRAMUSCULAR | Status: DC | PRN

## 2016-12-06 MED ORDER — FENTANYL CITRATE (PF) 100 MCG/2ML IJ SOLN: 25.0000 ug | INTRAMUSCULAR | Status: DC | PRN

## 2016-12-06 MED ORDER — CEFAZOLIN SODIUM-DEXTROSE 2-4 GM/100ML-% IV SOLN
2.0000 g | INTRAVENOUS | Status: AC
  Administered 2016-12-06: 2 g via INTRAVENOUS

## 2016-12-06 MED ORDER — CELECOXIB 200 MG PO CAPS
ORAL_CAPSULE | ORAL | Status: AC
  Filled 2016-12-06: qty 1

## 2016-12-06 MED ORDER — ACETAMINOPHEN 500 MG PO TABS: 1000.0000 mg | ORAL_TABLET | ORAL | Status: DC

## 2016-12-06 MED ORDER — PROPOFOL 500 MG/50ML IV EMUL
INTRAVENOUS | Status: DC | PRN
  Administered 2016-12-06: 125 ug/kg/min via INTRAVENOUS

## 2016-12-06 MED ORDER — CELECOXIB 200 MG PO CAPS: 200.0000 mg | ORAL_CAPSULE | ORAL | Status: DC

## 2016-12-06 MED ORDER — CELECOXIB 200 MG PO CAPS
200.0000 mg | ORAL_CAPSULE | ORAL | Status: AC
  Administered 2016-12-06: 200 mg via ORAL

## 2016-12-06 MED ORDER — PHENYLEPHRINE HCL 10 MG/ML IJ SOLN
INTRAMUSCULAR | Status: DC | PRN
  Administered 2016-12-06: 80 ug via INTRAVENOUS
  Administered 2016-12-06 (×2): 40 ug via INTRAVENOUS

## 2016-12-06 MED ORDER — GABAPENTIN 300 MG PO CAPS
300.0000 mg | ORAL_CAPSULE | ORAL | Status: AC
  Administered 2016-12-06: 300 mg via ORAL

## 2016-12-06 MED ORDER — FENTANYL CITRATE (PF) 100 MCG/2ML IJ SOLN
INTRAMUSCULAR | Status: AC
  Filled 2016-12-06: qty 2

## 2016-12-06 MED ORDER — CEFAZOLIN SODIUM-DEXTROSE 2-4 GM/100ML-% IV SOLN: 2.0000 g | INTRAVENOUS | Status: DC

## 2016-12-06 MED ORDER — MEPERIDINE HCL 25 MG/ML IJ SOLN: 6.2500 mg | INTRAMUSCULAR | Status: DC | PRN

## 2016-12-06 MED ORDER — PROPOFOL 500 MG/50ML IV EMUL
INTRAVENOUS | Status: AC
  Filled 2016-12-06: qty 50

## 2016-12-06 MED ORDER — HYDROCODONE-ACETAMINOPHEN 7.5-325 MG PO TABS
1.0000 | ORAL_TABLET | Freq: Once | ORAL | Status: DC | PRN
Start: 2016-12-06 — End: 2016-12-06

## 2016-12-06 MED ORDER — METOCLOPRAMIDE HCL 5 MG/ML IJ SOLN: 10.0000 mg | Freq: Once | INTRAMUSCULAR | Status: DC | PRN

## 2016-12-06 MED ORDER — FENTANYL CITRATE (PF) 100 MCG/2ML IJ SOLN
50.0000 ug | INTRAMUSCULAR | Status: DC | PRN
  Administered 2016-12-06: 50 ug via INTRAVENOUS

## 2016-12-06 MED ORDER — EPHEDRINE 5 MG/ML INJ
INTRAVENOUS | Status: AC
  Filled 2016-12-06: qty 20

## 2016-12-06 MED ORDER — PROPOFOL 10 MG/ML IV BOLUS
INTRAVENOUS | Status: DC | PRN
  Administered 2016-12-06: 160 mg via INTRAVENOUS
  Administered 2016-12-06: 40 mg via INTRAVENOUS

## 2016-12-06 MED ORDER — CEFAZOLIN SODIUM-DEXTROSE 2-4 GM/100ML-% IV SOLN
INTRAVENOUS | Status: AC
  Filled 2016-12-06: qty 100

## 2016-12-06 MED ORDER — ACETAMINOPHEN 500 MG PO TABS
1000.0000 mg | ORAL_TABLET | ORAL | Status: AC
  Administered 2016-12-06: 1000 mg via ORAL

## 2016-12-06 MED ORDER — GABAPENTIN 300 MG PO CAPS
ORAL_CAPSULE | ORAL | Status: AC
  Filled 2016-12-06: qty 1

## 2016-12-06 MED ORDER — BUPIVACAINE-EPINEPHRINE (PF) 0.5% -1:200000 IJ SOLN
INTRAMUSCULAR | Status: DC | PRN
  Administered 2016-12-06: 10 mL

## 2016-12-06 MED ORDER — SCOPOLAMINE 1 MG/3DAYS TD PT72: 1.0000 | MEDICATED_PATCH | Freq: Once | TRANSDERMAL | Status: DC | PRN

## 2016-12-06 MED ORDER — ONDANSETRON HCL 4 MG/2ML IJ SOLN
INTRAMUSCULAR | Status: AC
  Filled 2016-12-06: qty 2

## 2016-12-06 MED ORDER — GABAPENTIN 300 MG PO CAPS: 300.0000 mg | ORAL_CAPSULE | ORAL | Status: DC

## 2016-12-06 MED ORDER — ACETAMINOPHEN 500 MG PO TABS
ORAL_TABLET | ORAL | Status: AC
  Filled 2016-12-06: qty 2

## 2016-12-06 SURGICAL SUPPLY — 48 items
APPLIER CLIP 9.375 MED OPEN (MISCELLANEOUS) ×6
BINDER BREAST LRG (GAUZE/BANDAGES/DRESSINGS) IMPLANT
BINDER BREAST XLRG (GAUZE/BANDAGES/DRESSINGS) IMPLANT
BLADE HEX COATED 2.75 (ELECTRODE) ×3 IMPLANT
BLADE SURG 15 STRL LF DISP TIS (BLADE) ×1 IMPLANT
BLADE SURG 15 STRL SS (BLADE) ×2
CANISTER SUC SOCK COL 7IN (MISCELLANEOUS) IMPLANT
CANISTER SUCT 1200ML W/VALVE (MISCELLANEOUS) ×3 IMPLANT
CHLORAPREP W/TINT 26ML (MISCELLANEOUS) ×3 IMPLANT
CLIP APPLIE 9.375 MED OPEN (MISCELLANEOUS) ×2 IMPLANT
COVER BACK TABLE 60X90IN (DRAPES) ×3 IMPLANT
COVER MAYO STAND STRL (DRAPES) ×3 IMPLANT
COVER PROBE W GEL 5X96 (DRAPES) ×3 IMPLANT
DERMABOND ADVANCED (GAUZE/BANDAGES/DRESSINGS) ×2
DERMABOND ADVANCED .7 DNX12 (GAUZE/BANDAGES/DRESSINGS) ×1 IMPLANT
DEVICE DUBIN W/COMP PLATE 8390 (MISCELLANEOUS) ×3 IMPLANT
DRAPE LAPAROSCOPIC ABDOMINAL (DRAPES) ×3 IMPLANT
DRAPE UTILITY XL STRL (DRAPES) ×3 IMPLANT
DRSG PAD ABDOMINAL 8X10 ST (GAUZE/BANDAGES/DRESSINGS) ×3 IMPLANT
ELECT REM PT RETURN 9FT ADLT (ELECTROSURGICAL) ×3
ELECTRODE REM PT RTRN 9FT ADLT (ELECTROSURGICAL) ×1 IMPLANT
GAUZE SPONGE 4X4 12PLY STRL (GAUZE/BANDAGES/DRESSINGS) ×3 IMPLANT
GAUZE SPONGE 4X4 12PLY STRL LF (GAUZE/BANDAGES/DRESSINGS) ×3 IMPLANT
GLOVE EUDERMIC 7 POWDERFREE (GLOVE) ×6 IMPLANT
GLOVE EXAM NITRILE MD LF STRL (GLOVE) ×3 IMPLANT
GLOVE SURG SS PI 7.0 STRL IVOR (GLOVE) ×3 IMPLANT
GOWN STRL REUS W/ TWL LRG LVL3 (GOWN DISPOSABLE) ×1 IMPLANT
GOWN STRL REUS W/ TWL XL LVL3 (GOWN DISPOSABLE) ×1 IMPLANT
GOWN STRL REUS W/TWL LRG LVL3 (GOWN DISPOSABLE) ×2
GOWN STRL REUS W/TWL XL LVL3 (GOWN DISPOSABLE) ×2
ILLUMINATOR WAVEGUIDE N/F (MISCELLANEOUS) IMPLANT
KIT MARKER MARGIN INK (KITS) ×3 IMPLANT
LIGHT WAVEGUIDE WIDE FLAT (MISCELLANEOUS) IMPLANT
NEEDLE HYPO 25X1 1.5 SAFETY (NEEDLE) ×3 IMPLANT
NS IRRIG 1000ML POUR BTL (IV SOLUTION) ×3 IMPLANT
PACK BASIN DAY SURGERY FS (CUSTOM PROCEDURE TRAY) ×3 IMPLANT
PENCIL BUTTON HOLSTER BLD 10FT (ELECTRODE) ×3 IMPLANT
SLEEVE SCD COMPRESS KNEE MED (MISCELLANEOUS) ×3 IMPLANT
SPONGE LAP 4X18 X RAY DECT (DISPOSABLE) ×3 IMPLANT
SUT MNCRL AB 4-0 PS2 18 (SUTURE) ×3 IMPLANT
SUT SILK 2 0 SH (SUTURE) ×3 IMPLANT
SUT VICRYL 3-0 CR8 SH (SUTURE) ×3 IMPLANT
SYR 10ML LL (SYRINGE) ×3 IMPLANT
TOWEL OR 17X24 6PK STRL BLUE (TOWEL DISPOSABLE) ×3 IMPLANT
TOWEL OR NON WOVEN STRL DISP B (DISPOSABLE) ×6 IMPLANT
TUBE CONNECTING 20'X1/4 (TUBING) ×1
TUBE CONNECTING 20X1/4 (TUBING) ×2 IMPLANT
YANKAUER SUCT BULB TIP NO VENT (SUCTIONS) ×3 IMPLANT

## 2016-12-06 NOTE — Discharge Instructions (Signed)
°Post Anesthesia Home Care Instructions ° °Activity: °Get plenty of rest for the remainder of the day. A responsible individual must stay with you for 24 hours following the procedure.  °For the next 24 hours, DO NOT: °-Drive a car °-Operate machinery °-Drink alcoholic beverages °-Take any medication unless instructed by your physician °-Make any legal decisions or sign important papers. ° °Meals: °Start with liquid foods such as gelatin or soup. Progress to regular foods as tolerated. Avoid greasy, spicy, heavy foods. If nausea and/or vomiting occur, drink only clear liquids until the nausea and/or vomiting subsides. Call your physician if vomiting continues. ° °Special Instructions/Symptoms: °Your throat may feel dry or sore from the anesthesia or the breathing tube placed in your throat during surgery. If this causes discomfort, gargle with warm salt water. The discomfort should disappear within 24 hours. ° °If you had a scopolamine patch placed behind your ear for the management of post- operative nausea and/or vomiting: ° °1. The medication in the patch is effective for 72 hours, after which it should be removed.  Wrap patch in a tissue and discard in the trash. Wash hands thoroughly with soap and water. °2. You may remove the patch earlier than 72 hours if you experience unpleasant side effects which may include dry mouth, dizziness or visual disturbances. °3. Avoid touching the patch. Wash your hands with soap and water after contact with the patch. °  ° ° ° ° °[facility] °Office Phone Number [phone] ° °BREAST BIOPSY/ PARTIAL MASTECTOMY: POST OP INSTRUCTIONS ° °Always review your discharge instruction sheet given to you by the facility where your surgery was performed. ° °IF YOU HAVE DISABILITY OR FAMILY LEAVE FORMS, YOU MUST BRING THEM TO THE OFFICE FOR PROCESSING.  DO NOT GIVE THEM TO YOUR DOCTOR. ° °1. A prescription for pain medication may be given to you upon discharge.  Take your  pain medication as prescribed, if needed.  If narcotic pain medicine is not needed, then you may take acetaminophen (Tylenol) or ibuprofen (Advil) as needed. °2. Take your usually prescribed medications unless otherwise directed °3. If you need a refill on your pain medication, please contact your pharmacy.  They will contact our office to request authorization.  Prescriptions will not be filled after 5pm or on week-ends. °4. You should eat very light the first 24 hours after surgery, such as soup, crackers, pudding, etc.  Resume your normal diet the day after surgery. °5. Most patients will experience some swelling and bruising in the breast.  Ice packs and a good support bra will help.  Swelling and bruising can take several days to resolve.  °6. It is common to experience some constipation if taking pain medication after surgery.  Increasing fluid intake and taking a stool softener will usually help or prevent this problem from occurring.  A mild laxative (Milk of Magnesia or Miralax) should be taken according to package directions if there are no bowel movements after 48 hours. °7. Unless discharge instructions indicate otherwise, you may remove your bandages 24-48 hours after surgery, and you may shower at that time.  You may have steri-strips (small skin tapes) in place directly over the incision.  These strips should be left on the skin for 7-10 days.  If your surgeon used skin glue on the incision, you may shower in 24 hours.  The glue will flake off over the next 2-3 weeks.  Any sutures or staples will be removed at the office during your follow-up visit. °  8. ACTIVITIES:  You may resume regular daily activities (gradually increasing) beginning the next day.  Wearing a good support bra or sports bra minimizes pain and swelling.  You may have sexual intercourse when it is comfortable. °a. You may drive when you no longer are taking prescription pain medication, you can comfortably wear a seatbelt, and you can  safely maneuver your car and apply brakes. °b. RETURN TO WORK:  ______________________________________________________________________________________ °9. You should see your doctor in the office for a follow-up appointment approximately two weeks after your surgery.  Your doctor’s nurse will typically make your follow-up appointment when she calls you with your pathology report.  Expect your pathology report 2-3 business days after your surgery.  You may call to check if you do not hear from us after three days. °10. OTHER INSTRUCTIONS: _______________________________________________________________________________________________ _____________________________________________________________________________________________________________________________________ °_____________________________________________________________________________________________________________________________________ °_____________________________________________________________________________________________________________________________________ ° °WHEN TO CALL YOUR DOCTOR: °1. Fever over 101.0 °2. Nausea and/or vomiting. °3. Extreme swelling or bruising. °4. Continued bleeding from incision. °5. Increased pain, redness, or drainage from the incision. ° °The clinic staff is available to answer your questions during regular business hours.  Please don’t hesitate to call and ask to speak to one of the nurses for clinical concerns.  If you have a medical emergency, go to the nearest emergency room or call 911.  A surgeon from [facility] is always on call at the hospital. ° °For further questions, please visit [email or web address]  °

## 2016-12-06 NOTE — Anesthesia Preprocedure Evaluation (Signed)
Anesthesia Evaluation  Patient identified by MRN, date of birth, ID band Patient awake    Reviewed: Allergy & Precautions, NPO status , Patient's Chart, lab work & pertinent test results  History of Anesthesia Complications (+) PONV and history of anesthetic complications  Airway Mallampati: II  TM Distance: >3 FB Neck ROM: Full    Dental no notable dental hx. (+) Teeth Intact, Caps   Pulmonary former smoker,    Pulmonary exam normal breath sounds clear to auscultation       Cardiovascular negative cardio ROS Normal cardiovascular exam Rhythm:Regular Rate:Normal     Neuro/Psych  Headaches, Anxiety    GI/Hepatic negative GI ROS, Neg liver ROS,   Endo/Other  Hypothyroidism Hyperlipidemia  Renal/GU negative Renal ROS  negative genitourinary   Musculoskeletal Osteopenia   Abdominal (+) - obese,   Peds  Hematology negative hematology ROS (+)   Anesthesia Other Findings   Reproductive/Obstetrics                             Anesthesia Physical Anesthesia Plan  ASA: II  Anesthesia Plan: General   Post-op Pain Management:  Regional for Post-op pain   Induction: Intravenous  PONV Risk Score and Plan:   Airway Management Planned: LMA  Additional Equipment:   Intra-op Plan:   Post-operative Plan: Extubation in OR  Informed Consent: I have reviewed the patients History and Physical, chart, labs and discussed the procedure including the risks, benefits and alternatives for the proposed anesthesia with the patient or authorized representative who has indicated his/her understanding and acceptance.   Dental advisory given  Plan Discussed with: Anesthesiologist, CRNA and Surgeon  Anesthesia Plan Comments:         Anesthesia Quick Evaluation

## 2016-12-06 NOTE — Interval H&P Note (Signed)
History and Physical Interval Note:  12/06/2016 11:52 AM  Karen Chafe  has presented today for surgery, with the diagnosis of LEFT BREAST ALH  The various methods of treatment have been discussed with the patient and family. After consideration of risks, benefits and other options for treatment, the patient has consented to  Procedure(s): LEFT BREAST LUMPECTOMY WITH RADIOACTIVE SEED LOCALIZATION (Left) as a surgical intervention .  The patient's history has been reviewed, patient examined, no change in status, stable for surgery.  I have reviewed the patient's chart and labs.  Questions were answered to the patient's satisfaction.     Adin Hector

## 2016-12-06 NOTE — Anesthesia Postprocedure Evaluation (Signed)
Anesthesia Post Note  Patient: Cynthia Garrett  Procedure(s) Performed: LEFT BREAST LUMPECTOMY WITH RADIOACTIVE SEED LOCALIZATION (Left Breast)     Patient location during evaluation: PACU Anesthesia Type: General Level of consciousness: awake and alert and oriented Pain management: pain level controlled Vital Signs Assessment: post-procedure vital signs reviewed and stable Respiratory status: spontaneous breathing, nonlabored ventilation and respiratory function stable Cardiovascular status: blood pressure returned to baseline and stable Postop Assessment: no apparent nausea or vomiting Anesthetic complications: no    Last Vitals:  Vitals:   12/06/16 1209 12/06/16 1522  BP: 130/73 102/65  Pulse: 88   Resp: 18   Temp: 36.7 C (!) 36.4 C  SpO2: 98%     Last Pain:  Vitals:   12/06/16 1209  TempSrc: Oral  PainSc: 0-No pain                 Josslyn Ciolek A.

## 2016-12-06 NOTE — Transfer of Care (Signed)
Immediate Anesthesia Transfer of Care Note  Patient: Cynthia Garrett  Procedure(s) Performed: LEFT BREAST LUMPECTOMY WITH RADIOACTIVE SEED LOCALIZATION (Left Breast)  Patient Location: PACU  Anesthesia Type:General  Level of Consciousness: awake  Airway & Oxygen Therapy: Patient Spontanous Breathing and Patient connected to face mask oxygen  Post-op Assessment: Report given to RN and Post -op Vital signs reviewed and stable  Post vital signs: Reviewed and stable  Last Vitals:  Vitals:   12/06/16 1209 12/06/16 1522  BP: 130/73 (P) 102/65  Pulse: 88   Resp: 18   Temp: 36.7 C (!) (P) 36.4 C  SpO2: 98%     Last Pain:  Vitals:   12/06/16 1209  TempSrc: Oral  PainSc: 0-No pain         Complications: No apparent anesthesia complications

## 2016-12-06 NOTE — Op Note (Signed)
Patient Name:           Cynthia Garrett   Date of Surgery:        12/06/2016  Pre op Diagnosis:      Atypical lobular hyperplasia left breast  Post op Diagnosis:    Same  Procedure:                 Left breast lumpectomy with radioactive seed localization  Surgeon:                     Edsel Petrin. Dalbert Batman, M.D., FACS  Assistant:                      OR staff   Indication for Assistant: N/A  Operative Indications:    This is a very pleasant 76 year old woman from [place], [place]. She is here with her husband to discuss an abnormal mammogram of the left breast and a biopsy which showed atypical lobular hyperplasia. Dr. Edrick Oh is her PCP. She was referred by Dr. Autumn Patty at the [facility].     She has no prior history of breast problems. She was taking hormonal replacement therapy but recently stopped that. She gets annual screening mammograms. Recent imaging studies showed a focal area of distortion in the left breast superiorly. nothing was seen on ultrasound. The axilla was negative. Image guided biopsy shows fibrocystic changes, PA SH, and atypical lobular hyperplasia. The radiologist recommended excision. She wants this to be excised and does not want to take any chance.     Past history reveals no prior breast problems. She quit estrogen replacement therapy this year. She has lung nodules and is being followed bilateral pulmonary but apparently these are very stable. Hypothyroidism. Hyperlipidemia. Basically healthy. Family history is negative for breast cancer, ovarian cancer, or colon cancer. Mother died of congestive heart failure.Marland Kitchen     She will be scheduled for left breast lumpectomy with radioactive seed localization. . She agrees with this plan.  Operative Findings:       The radioactive seed marker clip and area of distortion or in the left breast at the 12:00 position, 8 or 9 cm superior to the areolar margin.  The specimen mammogram looked good with the marker clip  and seed in the center of the specimen.  There is no grossly abnormal palpable finding.  Procedure in Detail:          Following the induction of general LMA anesthesia the patient's left breast was prepped and draped in a sterile fashion.  Surgical timeout was performed.  Intravenous antibiotics were given.  0.5% Marcaine with epinephrine was used as local infiltration anesthetic.     Using the neoprobe I identified the area of maximum radioactivity high in the 12:00 position of the left breast.  A transverse curvilinear incision was made superiorly.  Lumpectomy was performed using electrocautery and the neoprobe.  The specimen was removed and marked with silk sutures and a 6 color ink kit  to orient the pathologist.  The specimen mammogram looked good and the specimen was marked and sent to the lab where the seed was retrieved.  Hemostasis was excellent.  The wound was irrigated.  5 metal clips were placed in the walls the lumpectomy cavity.  The lumpectomy cavity was closed with multiple layers of 3-0 Vicryl and skin closed with a running subcuticular 4-0 Monocryl and Dermabond.  Breast binder was placed and patient taken to PACU in stable condition.  EBL 10 mL.  Counts correct.  Complications none.     Edsel Petrin. Dalbert Batman, M.D., FACS General and Minimally Invasive Surgery Breast and Colorectal Surgery   Addendum: I logged on to the [facility] website and reviewed her prescription medication history  12/06/2016 3:21 PM

## 2016-12-06 NOTE — Anesthesia Procedure Notes (Signed)
Procedure Name: LMA Insertion Date/Time: 12/06/2016 2:39 PM Performed by: Lieutenant Diego Pre-anesthesia Checklist: Patient identified, Emergency Drugs available, Suction available and Patient being monitored Patient Re-evaluated:Patient Re-evaluated prior to induction Oxygen Delivery Method: Circle system utilized Preoxygenation: Pre-oxygenation with 100% oxygen Induction Type: IV induction Ventilation: Mask ventilation without difficulty LMA: LMA inserted LMA Size: 4.0 Number of attempts: 1 Airway Equipment and Method: Bite block Placement Confirmation: positive ETCO2 and breath sounds checked- equal and bilateral Tube secured with: Tape Dental Injury: Teeth and Oropharynx as per pre-operative assessment

## 2016-12-07 ENCOUNTER — Encounter (HOSPITAL_BASED_OUTPATIENT_CLINIC_OR_DEPARTMENT_OTHER): Payer: Self-pay | Admitting: General Surgery

## 2016-12-08 NOTE — Progress Notes (Signed)
Inform patient of Pathology report,.  Tell her that her left breast lumpectomy shows no cancer.  This is great news.  She does have some atypical lobular hyperplasia and I will discuss that with her in detail when I see her in the office.   She does not have cancer and will not need any further surgery, however.  Tell her that I tried to call her today and had to leave a message on her machine.  Let me know that you reached her.

## 2017-04-23 ENCOUNTER — Telehealth: Payer: Self-pay

## 2017-04-23 NOTE — Telephone Encounter (Signed)
Per pt she is having multiple bowel movements and is nauseated. Would like to be see Dr. Fuller Plan but his first available isn't until the end of march.

## 2017-04-23 NOTE — Telephone Encounter (Signed)
Patient will come in and see App on 05/03/17

## 2017-05-03 ENCOUNTER — Ambulatory Visit: Payer: Medicare Other | Admitting: Physician Assistant

## 2017-05-29 ENCOUNTER — Ambulatory Visit: Payer: Medicare Other | Admitting: Gastroenterology

## 2017-09-13 ENCOUNTER — Other Ambulatory Visit: Payer: Self-pay | Admitting: Family Medicine

## 2017-09-13 DIAGNOSIS — Z1231 Encounter for screening mammogram for malignant neoplasm of breast: Secondary | ICD-10-CM

## 2017-10-01 ENCOUNTER — Telehealth: Payer: Self-pay | Admitting: Adult Health

## 2017-10-01 DIAGNOSIS — R918 Other nonspecific abnormal finding of lung field: Secondary | ICD-10-CM

## 2017-10-01 NOTE — Telephone Encounter (Signed)
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

## 2017-10-01 NOTE — Telephone Encounter (Signed)
Former Cytogeneticist patient she needed a follow up

## 2017-10-11 ENCOUNTER — Ambulatory Visit (INDEPENDENT_AMBULATORY_CARE_PROVIDER_SITE_OTHER)
Admission: RE | Admit: 2017-10-11 | Discharge: 2017-10-11 | Disposition: A | Discharge status: Home / Self Care | Payer: Medicare Other | Source: Ambulatory Visit | Attending: Adult Health | Admitting: Adult Health

## 2017-10-11 DIAGNOSIS — R918 Other nonspecific abnormal finding of lung field: Secondary | ICD-10-CM

## 2017-10-26 ENCOUNTER — Ambulatory Visit: Payer: Medicare Other | Admitting: Emergency Medicine

## 2017-10-26 ENCOUNTER — Encounter: Payer: Self-pay | Admitting: Emergency Medicine

## 2017-10-26 DIAGNOSIS — R918 Other nonspecific abnormal finding of lung field: Secondary | ICD-10-CM | POA: Diagnosis not present

## 2017-10-26 DIAGNOSIS — J302 Other seasonal allergic rhinitis: Secondary | ICD-10-CM

## 2017-10-26 NOTE — Assessment & Plan Note (Signed)
Well-controlled using antihistamine as needed.  Continue same.

## 2017-10-26 NOTE — Assessment & Plan Note (Signed)
Stable by CT scan now going back to years.  Given their appearance, absence of any groundglass component, I believe we have done adequate surveillance and she should not need any further CT scans unless there is clinical change.  Explained this to her today.

## 2017-10-26 NOTE — Patient Instructions (Signed)
Your Ct scan of the chest shows that your pulmonary nodules are stable in size and appearance over 2 years.  You should not need any further scans to follow this unless you have a clinical change in your breathing, new discomfort, etc. Agree with using over-the-counter Zyrtec as needed for your allergy symptoms. Continue to follow with Dr. Edrick Oh as planned. Follow with Dr Lamonte Sakai if needed

## 2017-10-26 NOTE — Progress Notes (Signed)
Subjective:    Patient ID: Cynthia Garrett, female    DOB: 07-25-1940, 77 y.o.   MRN: [id number]  HPI 77 year old woman with a minimal smoking history (2.5 pack years), hypothyroidism, breast mass benign, lumpectomy.  She has been followed by Dr. Ashok Cordia in our office for allergic rhinitis and for pulmonary nodules that were found on CT scan of the chest.  Her original CT was 10/2015, shows some scattered pulmonary nodules the largest measuring 6 mm in the right middle lobe.  She had a repeat scan 10/12/2017 that I have reviewed.  The pulmonary nodules are stable there is been no interval change.  She notes that she has been doing well. Has been recently treated by Dr Edrick Oh, improving.  Uses OTC zyrtec.    Review of Systems  Past Medical History:  Diagnosis Date  . Allergic rhinitis, seasonal   . Anxiety   . Cataract    extraction  . Headache(784.0)   . Hyperlipidemia   . Hypothyroidism   . Left breast mass   . Lung nodules   . Osteopenia   . PONV (postoperative nausea and vomiting)   . Thyroid disease      Family History  Problem Relation Age of Onset  . Heart disease Mother   . Hypertension Mother   . Diabetes Sister   . Hypertension Sister   . COPD Brother   . Anesthesia problems Neg Hx   . Hypotension Neg Hx   . Malignant hyperthermia Neg Hx   . Pseudochol deficiency Neg Hx   . Colon cancer Neg Hx   . Cancer Neg Hx      Social History   Socioeconomic History  . Marital status: Married    Spouse name: Not on file  . Number of children: Not on file  . Years of education: Not on file  . Highest education level: Not on file  Occupational History  . Not on file  Social Needs  . Financial resource strain: Not on file  . Food insecurity:    Worry: Not on file    Inability: Not on file  . Transportation needs:    Medical: Not on file    Non-medical: Not on file  Tobacco Use  . Smoking status: Former Smoker    Packs/day: 0.25    Years: 10.00    Pack years:  2.50    Types: Cigarettes    Last attempt to quit: 06/07/1973    Years since quitting: 44.4  . Smokeless tobacco: Never Used  . Tobacco comment: Significant second-hand exposure.  Substance and Sexual Activity  . Alcohol use: Yes    Alcohol/week: 0.0 standard drinks    Comment: social  . Drug use: No  . Sexual activity: Yes    Birth control/protection: Post-menopausal  Lifestyle  . Physical activity:    Days per week: Not on file    Minutes per session: Not on file  . Stress: Not on file  Relationships  . Social connections:    Talks on phone: Not on file    Gets together: Not on file    Attends religious service: Not on file    Active member of club or organization: Not on file    Attends meetings of clubs or organizations: Not on file    Relationship status: Not on file  . Intimate partner violence:    Fear of current or ex partner: Not on file    Emotionally abused: Not on file  Physically abused: Not on file    Forced sexual activity: Not on file  Other Topics Concern  . Not on file  Social History Narrative   Originally from [place]. Always lived in [place]. Previously has traveled from [facility] to [place], [place], [place], [place], [place], [place], & [place]. Has dog. No bird exposure. Had bats in her attic previously that have been removed. No mold or hot tub exposure. Previously worked as an Agricultural consultant in a Restaurant manager, fast food. Previously enjoyed [place]. She has been doing Tai Chi.      Allergies  Allergen Reactions  . Cholestatin   . Tetanus Toxoids Swelling    Redness at site of injection  . Nitrofurantoin Nausea And Vomiting     Outpatient Medications Prior to Visit  Medication Sig Dispense Refill  . calcium citrate-vitamin D (CITRACAL+D) 315-200 MG-UNIT per tablet Take 2 tablets by mouth 2 (two) times daily.      Marland Kitchen levothyroxine (SYNTHROID, LEVOTHROID) 75 MCG tablet Take 75 mcg by mouth daily before breakfast.    . LORazepam (ATIVAN) 0.5 MG tablet Take 0.5 mg by mouth every  8 (eight) hours.    . Multiple Vitamins-Minerals (MULTIVITAMIN WITH MINERALS) tablet Take 1 tablet by mouth daily.      . pravastatin (PRAVACHOL) 40 MG tablet Take 40 mg by mouth daily.    Marland Kitchen HYDROcodone-acetaminophen (NORCO) 5-325 MG tablet Take 1-2 tablets by mouth every 6 (six) hours as needed for moderate pain or severe pain. 30 tablet 0   No facility-administered medications prior to visit.          Objective:   Physical Exam  Vitals:   10/26/17 0955  BP: 114/80  Pulse: 74  SpO2: 100%  Weight: 148 lb (67.1 kg)  Height: 5' (1.524 m)   Gen: Pleasant, well-nourished, in no distress,  normal affect  ENT: No lesions,  mouth clear,  oropharynx clear, no postnasal drip  Neck: No JVD, no stridor  Lungs: No use of accessory muscles, clear B   Cardiovascular: RRR, heart sounds normal, no murmur or gallops, no peripheral edema  Musculoskeletal: No deformities, no cyanosis or clubbing  Neuro: alert, non focal  Skin: Warm, no lesions or rash     Assessment & Plan:  Multiple lung nodules on CT Stable by CT scan now going back to years.  Given their appearance, absence of any groundglass component, I believe we have done adequate surveillance and she should not need any further CT scans unless there is clinical change.  Explained this to her today.  Chronic seasonal allergic rhinitis Well-controlled using antihistamine as needed.  Continue same.  Baltazar Apo, MD, PhD 10/26/2017, 10:18 AM [facility] [phone] or if no answer [phone]

## 2017-11-01 ENCOUNTER — Ambulatory Visit
Admission: RE | Admit: 2017-11-01 | Discharge: 2017-11-01 | Disposition: A | Discharge status: Home / Self Care | Payer: Medicare Other | Source: Ambulatory Visit | Attending: Family Medicine | Admitting: Family Medicine

## 2017-11-01 DIAGNOSIS — Z1231 Encounter for screening mammogram for malignant neoplasm of breast: Secondary | ICD-10-CM

## 2018-09-18 ENCOUNTER — Other Ambulatory Visit: Payer: Self-pay | Admitting: Family Medicine

## 2018-09-18 DIAGNOSIS — Z1231 Encounter for screening mammogram for malignant neoplasm of breast: Secondary | ICD-10-CM

## 2018-10-02 ENCOUNTER — Other Ambulatory Visit: Payer: Self-pay

## 2018-10-03 ENCOUNTER — Encounter: Payer: Self-pay | Admitting: Physician Assistant

## 2018-10-03 ENCOUNTER — Ambulatory Visit (INDEPENDENT_AMBULATORY_CARE_PROVIDER_SITE_OTHER): Payer: Medicare Other | Admitting: Physician Assistant

## 2018-10-03 VITALS — BP 119/77 | HR 78 | Temp 97.3°F | Ht 60.0 in | Wt 148.0 lb

## 2018-10-03 DIAGNOSIS — E782 Mixed hyperlipidemia: Secondary | ICD-10-CM

## 2018-10-03 DIAGNOSIS — F411 Generalized anxiety disorder: Secondary | ICD-10-CM | POA: Diagnosis not present

## 2018-10-03 MED ORDER — PRAVASTATIN SODIUM 40 MG PO TABS: 40.0000 mg | ORAL_TABLET | Freq: Every day | ORAL | 1 refills | Status: DC

## 2018-10-03 MED ORDER — SERTRALINE HCL 50 MG PO TABS: ORAL_TABLET | ORAL | 0 refills | Status: DC

## 2018-10-03 NOTE — Progress Notes (Signed)
  Subjective:     Patient ID: Cynthia Garrett, female   DOB: November 06, 1940, 78 y.o.   MRN: [id number]  HPI Pt here to establish care Prev seen by Dr Edrick Oh Pt needing rf on Zoloft and Pravachol Pt relates she has not been the Zoloft on a regular basis Was started on Zoloft to help with hot flashes and to "calm her down" Still intermit talking Ativan Sister and son with similar sx She is taking the Pravachol  Review of Systems  Constitutional: Negative.   Psychiatric/Behavioral: Negative for agitation, behavioral problems, confusion, self-injury and sleep disturbance. The patient is nervous/anxious. The patient is not hyperactive.        Objective:   Physical Exam Vitals signs and nursing note reviewed.  Constitutional:      General: She is not in acute distress.    Appearance: Normal appearance. She is normal weight. She is not ill-appearing.  Neurological:     Mental Status: She is alert.  Psychiatric:        Mood and Affect: Mood normal.        Behavior: Behavior normal.        Thought Content: Thought content normal.        Judgment: Judgment normal.     Comments: Pt relaxed sitting in chair for appt Good eye contact and interaction   Prev labs reviewed     Assessment:     1. Anxiety state   2. Mixed hyperlipidemia        Plan:     Pt agreed to take Zoloft as prescribed for 1 month She will f/u in 1 month to see if dose needs to be adjusted Will return sooner if any problems Rf of Pravachol also done today Continue with good diet

## 2018-10-03 NOTE — Patient Instructions (Signed)

## 2018-10-08 ENCOUNTER — Ambulatory Visit: Payer: Self-pay | Admitting: Family Medicine

## 2018-10-28 ENCOUNTER — Other Ambulatory Visit: Payer: Self-pay

## 2018-10-28 ENCOUNTER — Telehealth: Payer: Self-pay | Admitting: Family Medicine

## 2018-10-29 ENCOUNTER — Encounter: Payer: Self-pay | Admitting: Family Medicine

## 2018-10-29 ENCOUNTER — Ambulatory Visit (INDEPENDENT_AMBULATORY_CARE_PROVIDER_SITE_OTHER): Payer: Medicare Other | Admitting: Family Medicine

## 2018-10-29 VITALS — BP 130/81 | HR 77 | Temp 98.0°F | Ht 60.0 in | Wt 147.0 lb

## 2018-10-29 DIAGNOSIS — E782 Mixed hyperlipidemia: Secondary | ICD-10-CM | POA: Diagnosis not present

## 2018-10-29 DIAGNOSIS — R32 Unspecified urinary incontinence: Secondary | ICD-10-CM | POA: Diagnosis not present

## 2018-10-29 DIAGNOSIS — N401 Enlarged prostate with lower urinary tract symptoms: Secondary | ICD-10-CM

## 2018-10-29 DIAGNOSIS — Z780 Asymptomatic menopausal state: Secondary | ICD-10-CM

## 2018-10-29 DIAGNOSIS — Z0000 Encounter for general adult medical examination without abnormal findings: Secondary | ICD-10-CM

## 2018-10-29 DIAGNOSIS — Z13820 Encounter for screening for osteoporosis: Secondary | ICD-10-CM | POA: Diagnosis not present

## 2018-10-29 DIAGNOSIS — R351 Nocturia: Secondary | ICD-10-CM

## 2018-10-29 MED ORDER — PRAVASTATIN SODIUM 40 MG PO TABS: 40.0000 mg | ORAL_TABLET | Freq: Every day | ORAL | 1 refills | Status: DC

## 2018-10-29 MED ORDER — SERTRALINE HCL 100 MG PO TABS: 100.0000 mg | ORAL_TABLET | Freq: Every day | ORAL | 1 refills | Status: DC

## 2018-10-29 MED ORDER — LEVOTHYROXINE SODIUM 75 MCG PO TABS: 75.0000 ug | ORAL_TABLET | Freq: Every day | ORAL | 1 refills | Status: DC

## 2018-10-29 NOTE — Progress Notes (Addendum)
Subjective:  Patient ID: Cynthia Garrett, female    DOB: 06-May-1940  Age: 78 y.o. MRN: [id number]  CC: Medical Management of Chronic Issues (recheck meds ), Hypothyroidism, and Hyperlipidemia   HPI Cynthia Garrett presents for follow-up of elevated cholesterol. Doing well without complaints on current medication. Denies side effects of statin including myalgia and arthralgia and nausea. Also in today for liver function testing. Currently no chest pain, shortness of breath or other cardiovascular related symptoms noted.  Patient presents for follow-up on  thyroid. The patient has a history of hypothyroidism for many years. It has been stable recently. Pt. denies any change in  voice, loss of hair, heat or cold intolerance. Energy level has been adequate to good. Patient denies constipation and diarrhea. No myxedema. Medication is as noted below. Verified that pt is taking it daily on an empty stomach. Well tolerated.  Pt. Recently seen for anxiety and depresion. Placed on zoloft 50 mg daily. Had been using PRN. Pt. States there has no improvement in her symptoms of worry, restlessness and trouble relaxing and sleeping. She awakens feeling on edge and has palpitations frequently in the morning. History Cynthia Garrett has a past medical history of Allergic rhinitis, seasonal, Allergy, Anxiety, Cataract, Headache(784.0), Hyperlipidemia, Hypothyroidism, Left breast mass, Lung nodules, Osteopenia, PONV (postoperative nausea and vomiting), and Thyroid disease.   She has a past surgical history that includes Hysterotomy; Colonoscopy; Cataract extraction w/PHACO (06/26/2011); Cataract extraction w/PHACO (07/06/2011); and Breast lumpectomy with radioactive seed localization (Left, 12/06/2016).   Her family history includes COPD in her brother; Diabetes in her sister; Heart disease in her mother; Hypertension in her mother and sister.She reports that she quit smoking about 45 years ago. Her smoking use included  cigarettes. She has a 2.50 pack-year smoking history. She has never used smokeless tobacco. She reports previous alcohol use. She reports that she does not use drugs.  Current Outpatient Medications on File Prior to Visit  Medication Sig Dispense Refill   calcium citrate-vitamin D (CITRACAL+D) 315-200 MG-UNIT per tablet Take 2 tablets by mouth 2 (two) times daily.       LORazepam (ATIVAN) 0.5 MG tablet Take 0.5 mg by mouth every 8 (eight) hours.     Multiple Vitamins-Minerals (MULTIVITAMIN WITH MINERALS) tablet Take 1 tablet by mouth daily.       trimethoprim (TRIMPEX) 100 MG tablet Take 100 mg by mouth daily.     No current facility-administered medications on file prior to visit.     ROS Review of Systems  Constitutional: Negative.   HENT: Negative for congestion.   Eyes: Negative for visual disturbance.  Respiratory: Negative for shortness of breath.   Cardiovascular: Negative for chest pain.  Gastrointestinal: Negative for abdominal pain, constipation, diarrhea, nausea and vomiting.  Genitourinary: Positive for difficulty urinating and frequency.  Musculoskeletal: Negative for arthralgias and myalgias.  Neurological: Negative for headaches.  Psychiatric/Behavioral: Negative for sleep disturbance.    Objective:  BP 130/81    Pulse 77    Temp 98 F (36.7 C)    Ht 5' (1.524 m)    Wt 147 lb (66.7 kg)    BMI 28.71 kg/m   BP Readings from Last 3 Encounters:  10/29/18 130/81  10/03/18 119/77  10/26/17 114/80    Wt Readings from Last 3 Encounters:  10/29/18 147 lb (66.7 kg)  10/03/18 148 lb (67.1 kg)  10/26/17 148 lb (67.1 kg)     Physical Exam Constitutional:      General: She is not  in acute distress.    Appearance: She is well-developed.  HENT:     Head: Normocephalic and atraumatic.     Right Ear: External ear normal.     Left Ear: External ear normal.     Nose: Nose normal.  Eyes:     Conjunctiva/sclera: Conjunctivae normal.     Pupils: Pupils are equal,  round, and reactive to light.  Neck:     Musculoskeletal: Normal range of motion and neck supple.     Thyroid: No thyromegaly.  Cardiovascular:     Rate and Rhythm: Normal rate and regular rhythm.     Heart sounds: Normal heart sounds. No murmur.  Pulmonary:     Effort: Pulmonary effort is normal. No respiratory distress.     Breath sounds: Normal breath sounds. No wheezing or rales.  Abdominal:     General: Bowel sounds are normal. There is no distension.     Palpations: Abdomen is soft.     Tenderness: There is no abdominal tenderness.  Lymphadenopathy:     Cervical: No cervical adenopathy.  Skin:    General: Skin is warm and dry.  Neurological:     Mental Status: She is alert and oriented to person, place, and time.     Deep Tendon Reflexes: Reflexes are normal and symmetric.  Psychiatric:        Behavior: Behavior normal.        Thought Content: Thought content normal.        Judgment: Judgment normal.     No results found for: HGBA1C  Lab Results  Component Value Date   WBC 6.2 11/30/2016   HGB 13.9 11/30/2016   HCT 41.9 11/30/2016   PLT 261 11/30/2016   GLUCOSE 102 (H) 11/30/2016   ALT 34 11/30/2016   AST 37 11/30/2016   NA 139 11/30/2016   K 4.1 11/30/2016   CL 99 (L) 11/30/2016   CREATININE 0.73 11/30/2016   BUN 13 11/30/2016   CO2 28 11/30/2016    Mm 3d Screen Breast Bilateral  Result Date: 11/01/2017 CLINICAL DATA:  Screening. EXAM: DIGITAL SCREENING BILATERAL MAMMOGRAM WITH TOMO AND CAD COMPARISON:  Previous exam(s). ACR Breast Density Category c: The breast tissue is heterogeneously dense, which may obscure small masses. FINDINGS: There are no findings suspicious for malignancy. Images were processed with CAD. IMPRESSION: No mammographic evidence of malignancy. A result letter of this screening mammogram will be mailed directly to the patient. RECOMMENDATION: Screening mammogram in one year. (Code:SM-B-01Y) BI-RADS CATEGORY  1: Negative. Electronically  Signed   By: Nolon Nations M.D.   On: 11/01/2017 14:39    Assessment & Plan:   Cynthia Garrett was seen today for medical management of chronic issues, hypothyroidism and hyperlipidemia.  Diagnoses and all orders for this visit:  Health care maintenance -     EKG 12-Lead  Mixed hyperlipidemia -     TSH -     T4, Free -     CMP14+EGFR -     EKG 12-Lead  Screening for osteoporosis -     DG Bone Density; Future  Postmenopausal -     DG Bone Density; Future  Benign prostatic hyperplasia with nocturia  Other orders -     pravastatin (PRAVACHOL) 40 MG tablet; Take 1 tablet (40 mg total) by mouth daily. -     levothyroxine (SYNTHROID) 75 MCG tablet; Take 1 tablet (75 mcg total) by mouth daily before breakfast. Name Brand Only -     sertraline (ZOLOFT) 100  MG tablet; Take 1 tablet (100 mg total) by mouth at bedtime. TAKE ONE TABLET (50 MG DOSE) BY MOUTH DAILY.   I have changed Grayland Ormond. Muha's levothyroxine and sertraline. I am also having her maintain her multivitamin with minerals, calcium citrate-vitamin D, LORazepam, trimethoprim, and pravastatin.  Meds ordered this encounter  Medications   pravastatin (PRAVACHOL) 40 MG tablet    Sig: Take 1 tablet (40 mg total) by mouth daily.    Dispense:  90 tablet    Refill:  1   levothyroxine (SYNTHROID) 75 MCG tablet    Sig: Take 1 tablet (75 mcg total) by mouth daily before breakfast. Name Brand Only    Dispense:  90 tablet    Refill:  1   sertraline (ZOLOFT) 100 MG tablet    Sig: Take 1 tablet (100 mg total) by mouth at bedtime. TAKE ONE TABLET (50 MG DOSE) BY MOUTH DAILY.    Dispense:  90 tablet    Refill:  1     Follow-up: Return in about 6 months (around 05/01/2019).  Claretta Fraise, M.D.

## 2018-10-30 LAB — T4, FREE: Free T4: 1.26 ng/dL (ref 0.82–1.77)

## 2018-10-30 LAB — CMP14+EGFR
ALT: 37 IU/L — ABNORMAL HIGH (ref 0–32)
AST: 42 IU/L — ABNORMAL HIGH (ref 0–40)
Albumin/Globulin Ratio: 2 (ref 1.2–2.2)
Albumin: 4.9 g/dL — ABNORMAL HIGH (ref 3.7–4.7)
Alkaline Phosphatase: 84 IU/L (ref 39–117)
BUN/Creatinine Ratio: 14 (ref 12–28)
BUN: 12 mg/dL (ref 8–27)
Bilirubin Total: 0.3 mg/dL (ref 0.0–1.2)
CO2: 26 mmol/L (ref 20–29)
Calcium: 9.9 mg/dL (ref 8.7–10.3)
Chloride: 99 mmol/L (ref 96–106)
Creatinine, Ser: 0.88 mg/dL (ref 0.57–1.00)
GFR calc Af Amer: 73 mL/min/{1.73_m2} (ref >59)
GFR calc non Af Amer: 63 mL/min/{1.73_m2} (ref >59)
Globulin, Total: 2.5 g/dL (ref 1.5–4.5)
Glucose: 104 mg/dL — ABNORMAL HIGH (ref 65–99)
Potassium: 4.4 mmol/L (ref 3.5–5.2)
Sodium: 142 mmol/L (ref 134–144)
Total Protein: 7.4 g/dL (ref 6.0–8.5)

## 2018-10-30 LAB — TSH: TSH: 1.48 u[IU]/mL (ref 0.450–4.500)

## 2018-11-04 ENCOUNTER — Ambulatory Visit
Admission: RE | Admit: 2018-11-04 | Discharge: 2018-11-04 | Disposition: A | Discharge status: Home / Self Care | Payer: Medicare Other | Source: Ambulatory Visit | Attending: Family Medicine | Admitting: Family Medicine

## 2018-11-04 ENCOUNTER — Other Ambulatory Visit: Payer: Self-pay

## 2018-11-04 DIAGNOSIS — Z1231 Encounter for screening mammogram for malignant neoplasm of breast: Secondary | ICD-10-CM | POA: Diagnosis not present

## 2018-11-05 ENCOUNTER — Telehealth: Payer: Self-pay | Admitting: Family Medicine

## 2018-11-05 NOTE — Telephone Encounter (Signed)
Please contact the patient : All labs are normal. ( A few are borderline and marked, but nonne of them is off enough to be significant. WS

## 2018-11-05 NOTE — Telephone Encounter (Signed)
Please review labs and advise.  Patient is calling for lab results.

## 2018-11-05 NOTE — Telephone Encounter (Signed)
No answer, no voicemail.

## 2018-11-06 ENCOUNTER — Telehealth: Payer: Self-pay | Admitting: Family Medicine

## 2018-11-06 NOTE — Telephone Encounter (Signed)
Patient notified that mammogram was normal and a letter was sent. She needs to repeat in 1 year. Patient verbalized understanding

## 2018-11-13 NOTE — Telephone Encounter (Signed)
Aware of results via telephone encounter

## 2019-01-06 DIAGNOSIS — M8589 Other specified disorders of bone density and structure, multiple sites: Secondary | ICD-10-CM | POA: Diagnosis not present

## 2019-01-06 DIAGNOSIS — Z780 Asymptomatic menopausal state: Secondary | ICD-10-CM | POA: Diagnosis not present

## 2019-01-06 DIAGNOSIS — M810 Age-related osteoporosis without current pathological fracture: Secondary | ICD-10-CM | POA: Diagnosis not present

## 2019-01-15 ENCOUNTER — Other Ambulatory Visit: Payer: Self-pay | Admitting: Family Medicine

## 2019-01-15 ENCOUNTER — Telehealth: Payer: Self-pay | Admitting: Family Medicine

## 2019-01-15 MED ORDER — RISEDRONATE SODIUM 150 MG PO TABS: 150.0000 mg | ORAL_TABLET | ORAL | 3 refills | Status: DC

## 2019-01-15 NOTE — Telephone Encounter (Signed)
Patient aware of results and verbalizes understanding.  

## 2019-01-15 NOTE — Telephone Encounter (Signed)
Have you seen Dexa scan result come across yet?

## 2019-01-15 NOTE — Telephone Encounter (Signed)
It just came in today by mail. It shows osteopenia (mild decline in bone density) in the hips. It has worsened since last check. 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 I recommend she take Actonel once a month to prevent further worsening and to decrease the risk of fracture. WS

## 2019-01-16 ENCOUNTER — Ambulatory Visit (INDEPENDENT_AMBULATORY_CARE_PROVIDER_SITE_OTHER): Payer: Medicare Other | Admitting: Nurse Practitioner

## 2019-01-16 ENCOUNTER — Encounter: Payer: Self-pay | Admitting: Nurse Practitioner

## 2019-01-16 ENCOUNTER — Other Ambulatory Visit: Payer: Self-pay

## 2019-01-16 VITALS — BP 138/85 | HR 87 | Temp 96.7°F | Ht 62.0 in | Wt 146.6 lb

## 2019-01-16 DIAGNOSIS — M5441 Lumbago with sciatica, right side: Secondary | ICD-10-CM | POA: Diagnosis not present

## 2019-01-16 MED ORDER — PREDNISONE 10 MG (21) PO TBPK: ORAL_TABLET | ORAL | 0 refills | Status: DC

## 2019-01-16 MED ORDER — CYCLOBENZAPRINE HCL 5 MG PO TABS: 5.0000 mg | ORAL_TABLET | Freq: Three times a day (TID) | ORAL | 0 refills | Status: DC | PRN

## 2019-01-16 NOTE — Progress Notes (Signed)
   Subjective:    Patient ID: JACKIE STURM, female    DOB: 03-27-1940, 78 y.o.   MRN: [id number]   Chief Complaint: Back Pain (FOR 3 WEEKS GOES DOWN RIGHT SIDE HIP)   HPI Patient comes in c/o low back pain that started a few weeks ago. Has progressed to the right side and don leg some. When she moves a certain way of crosses her legs the pain increases.   Rates pain 5/10 sitting now but if she twists or crosses legs it goes to a 10/10. She has been using tylenol arthritis with no relief.   Review of Systems  Constitutional: Negative for activity change and appetite change.  HENT: Negative.   Eyes: Negative for pain.  Respiratory: Negative for shortness of breath.   Cardiovascular: Negative for chest pain, palpitations and leg swelling.  Gastrointestinal: Negative for abdominal pain.  Endocrine: Negative for polydipsia.  Genitourinary: Negative.   Musculoskeletal: Positive for back pain.  Skin: Negative for rash.  Neurological: Negative for dizziness, weakness and headaches.  Hematological: Does not bruise/bleed easily.  Psychiatric/Behavioral: Negative.   All other systems reviewed and are negative.      Objective:   Physical Exam Vitals signs and nursing note reviewed.  Constitutional:      Appearance: Normal appearance.  Cardiovascular:     Heart sounds: Normal heart sounds.  Pulmonary:     Effort: Pulmonary effort is normal.     Breath sounds: Normal breath sounds.  Musculoskeletal: Normal range of motion.     Comments: Rises slowly from sitting to standing Decrease ROM of lumar spine due to pain on rotation (-) SLR bil Motor strength and sensation distally intact  Neurological:     Mental Status: She is alert.    BP 138/85   Pulse 87   Temp (!) 96.7 F (35.9 C) (Temporal)   Ht 5\' 2"  (1.575 m)   Wt 146 lb 9.6 oz (66.5 kg)   SpO2 97%   BMI 26.81 kg/m         Assessment & Plan:  MALLISSA ANEZ comes in today with chief complaint of Back Pain (FOR 3  WEEKS GOES DOWN RIGHT SIDE HIP)   Diagnosis and orders addressed:  1. Acute right-sided low back pain with right-sided sciatica Moist heat Rest - cyclobenzaprine (FLEXERIL) 5 MG tablet; Take 1 tablet (5 mg total) by mouth 3 (three) times daily as needed for muscle spasms.  Dispense: 30 tablet; Refill: 0 - predniSONE (STERAPRED UNI-PAK 21 TAB) 10 MG (21) TBPK tablet; As directed x 6 days  Dispense: 21 tablet; Refill: 0    Follow up plan: prn   Mary-Margaret Hassell Done, FNP

## 2019-01-16 NOTE — Patient Instructions (Signed)
Acute Back Pain, Adult Acute back pain is sudden and usually short-lived. It is often caused by an injury to the muscles and tissues in the back. The injury may result from:  A muscle or ligament getting overstretched or torn (strained). Ligaments are tissues that connect bones to each other. Lifting something improperly can cause a back strain.  Wear and tear (degeneration) of the spinal disks. Spinal disks are circular tissue that provides cushioning between the bones of the spine (vertebrae).  Twisting motions, such as while playing sports or doing yard work.  A hit to the back.  Arthritis. You may have a physical exam, lab tests, and imaging tests to find the cause of your pain. Acute back pain usually goes away with rest and home care. Follow these instructions at home: Managing pain, stiffness, and swelling  Take over-the-counter and prescription medicines only as told by your health care provider.  Your health care provider may recommend applying ice during the first 24-48 hours after your pain starts. To do this: ? Put ice in a plastic bag. ? Place a towel between your skin and the bag. ? Leave the ice on for 20 minutes, 2-3 times a day.  If directed, apply heat to the affected area as often as told by your health care provider. Use the heat source that your health care provider recommends, such as a moist heat pack or a heating pad. ? Place a towel between your skin and the heat source. ? Leave the heat on for 20-30 minutes. ? Remove the heat if your skin turns bright red. This is especially important if you are unable to feel pain, heat, or cold. You have a greater risk of getting burned. Activity   Do not stay in bed. Staying in bed for more than 1-2 days can delay your recovery.  Sit up and stand up straight. Avoid leaning forward when you sit, or hunching over when you stand. ? If you work at a desk, sit close to it so you do not need to lean over. Keep your chin tucked  in. Keep your neck drawn back, and keep your elbows bent at a right angle. Your arms should look like the letter "L." ? Sit high and close to the steering wheel when you drive. Add lower back (lumbar) support to your car seat, if needed.  Take short walks on even surfaces as soon as you are able. Try to increase the length of time you walk each day.  Do not sit, drive, or stand in one place for more than 30 minutes at a time. Sitting or standing for long periods of time can put stress on your back.  Do not drive or use heavy machinery while taking prescription pain medicine.  Use proper lifting techniques. When you bend and lift, use positions that put less stress on your back: ? Bend your knees. ? Keep the load close to your body. ? Avoid twisting.  Exercise regularly as told by your health care provider. Exercising helps your back heal faster and helps prevent back injuries by keeping muscles strong and flexible.  Work with a physical therapist to make a safe exercise program, as recommended by your health care provider. Do any exercises as told by your physical therapist. Lifestyle  Maintain a healthy weight. Extra weight puts stress on your back and makes it difficult to have good posture.  Avoid activities or situations that make you feel anxious or stressed. Stress and anxiety increase muscle   tension and can make back pain worse. Learn ways to manage anxiety and stress, such as through exercise. General instructions  Sleep on a firm mattress in a comfortable position. Try lying on your side with your knees slightly bent. If you lie on your back, put a pillow under your knees.  Follow your treatment plan as told by your health care provider. This may include: ? Cognitive or behavioral therapy. ? Acupuncture or massage therapy. ? Meditation or yoga. Contact a health care provider if:  You have pain that is not relieved with rest or medicine.  You have increasing pain going down  into your legs or buttocks.  Your pain does not improve after 2 weeks.  You have pain at night.  You lose weight without trying.  You have a fever or chills. Get help right away if:  You develop new bowel or bladder control problems.  You have unusual weakness or numbness in your arms or legs.  You develop nausea or vomiting.  You develop abdominal pain.  You feel faint. Summary  Acute back pain is sudden and usually short-lived.  Use proper lifting techniques. When you bend and lift, use positions that put less stress on your back.  Take over-the-counter and prescription medicines and apply heat or ice as directed by your health care provider. This information is not intended to replace advice given to you by your health care provider. Make sure you discuss any questions you have with your health care provider. Document Released: 02/20/2005 Document Revised: 06/11/2018 Document Reviewed: 10/04/2016 Elsevier Patient Education  2020 [employer]  

## 2019-02-10 ENCOUNTER — Other Ambulatory Visit: Payer: Self-pay

## 2019-02-11 ENCOUNTER — Ambulatory Visit (INDEPENDENT_AMBULATORY_CARE_PROVIDER_SITE_OTHER): Payer: Medicare Other

## 2019-02-11 ENCOUNTER — Other Ambulatory Visit: Payer: Self-pay

## 2019-02-11 ENCOUNTER — Ambulatory Visit (INDEPENDENT_AMBULATORY_CARE_PROVIDER_SITE_OTHER): Payer: Medicare Other | Admitting: Family Medicine

## 2019-02-11 ENCOUNTER — Encounter: Payer: Self-pay | Admitting: Family Medicine

## 2019-02-11 VITALS — BP 137/87 | HR 87 | Temp 97.3°F | Resp 20 | Ht 62.0 in | Wt 148.0 lb

## 2019-02-11 DIAGNOSIS — M5136 Other intervertebral disc degeneration, lumbar region: Secondary | ICD-10-CM | POA: Diagnosis not present

## 2019-02-11 DIAGNOSIS — M5441 Lumbago with sciatica, right side: Secondary | ICD-10-CM | POA: Diagnosis not present

## 2019-02-11 DIAGNOSIS — E782 Mixed hyperlipidemia: Secondary | ICD-10-CM

## 2019-02-11 DIAGNOSIS — M25551 Pain in right hip: Secondary | ICD-10-CM | POA: Diagnosis not present

## 2019-02-11 MED ORDER — CELECOXIB 200 MG PO CAPS: 200.0000 mg | ORAL_CAPSULE | Freq: Every day | ORAL | 5 refills | Status: DC

## 2019-02-11 MED ORDER — CYCLOBENZAPRINE HCL 5 MG PO TABS: 5.0000 mg | ORAL_TABLET | Freq: Three times a day (TID) | ORAL | 0 refills | Status: DC | PRN

## 2019-02-11 NOTE — Progress Notes (Signed)
Subjective:  Patient ID: Cynthia Garrett, female    DOB: [birth year]  Age: 78 y.o. MRN: [id number]  CC: right hip pain (and back - follow up )   HPI Cynthia Garrett presents for continued pain at the back and the right hip region. Pain is moderate, intermittent. Worse with certain positions. Tried to sleep in a recliner for relief but couldn't rest well. Pain is located in a band across the mid back . Feels like an ache. Onset a couple of months ago. Stable, but not improving in spite of treatment last month. Husband havin major surgery in a few days so she declines PT and ortho referrals.   Depression screen [facility] 2/9 02/11/2019 01/16/2019 10/29/2018  Decreased Interest 0 0 0  Down, Depressed, Hopeless 0 0 1  PHQ - 2 Score 0 0 1  Altered sleeping - - -  Tired, decreased energy - - -  Change in appetite - - -  Feeling bad or failure about yourself  - - -  Trouble concentrating - - -  Moving slowly or fidgety/restless - - -  Suicidal thoughts - - -  PHQ-9 Score - - -    History Cynthia Garrett has a past medical history of Allergic rhinitis, seasonal, Allergy, Anxiety, Cataract, Headache(784.0), Hyperlipidemia, Hypothyroidism, Left breast mass, Lung nodules, Osteopenia, PONV (postoperative nausea and vomiting), and Thyroid disease.   She has a past surgical history that includes Hysterotomy; Colonoscopy; Cataract extraction w/PHACO (06/26/2011); Cataract extraction w/PHACO (07/06/2011); Breast lumpectomy with radioactive seed localization (Left, 12/06/2016); and Breast excisional biopsy (Left, 2018).   Her family history includes COPD in her brother; Diabetes in her sister; Heart disease in her mother; Hypertension in her mother and sister.She reports that she quit smoking about 45 years ago. Her smoking use included cigarettes. She has a 2.50 pack-year smoking history. She has never used smokeless tobacco. She reports previous alcohol use. She reports that she does not use drugs.    ROS Review of  Systems  Constitutional: Negative.   HENT: Negative.   Respiratory: Negative for shortness of breath.   Cardiovascular: Negative for chest pain.  Gastrointestinal: Negative for abdominal pain.  Musculoskeletal: Positive for arthralgias and back pain.    Objective:  BP 137/87   Pulse 87   Temp (!) 97.3 F (36.3 C)   Resp 20   Ht 5\' 2"  (1.575 m)   Wt 148 lb (67.1 kg)   SpO2 99%   BMI 27.07 kg/m   BP Readings from Last 3 Encounters:  02/11/19 137/87  01/16/19 138/85  10/29/18 130/81    Wt Readings from Last 3 Encounters:  02/11/19 148 lb (67.1 kg)  01/16/19 146 lb 9.6 oz (66.5 kg)  10/29/18 147 lb (66.7 kg)     Physical Exam Constitutional:      Appearance: She is well-developed.  HENT:     Head: Normocephalic.  Cardiovascular:     Rate and Rhythm: Normal rate and regular rhythm.     Heart sounds: No murmur.  Pulmonary:     Effort: Pulmonary effort is normal.     Breath sounds: Normal breath sounds.  Musculoskeletal:        General: Tenderness (for ROM at right hip and spinal flexion.) present.       Assessment & Plan:   Darionna was seen today for right hip pain.  Diagnoses and all orders for this visit:  Acute right-sided low back pain with right-sided sciatica -     DG Lumbar  Spine 2-3 Views; Future -     DG HIP UNILAT WITH PELVIS 2-3 VIEWS RIGHT; Future -     cyclobenzaprine (FLEXERIL) 5 MG tablet; Take 1 tablet (5 mg total) by mouth 3 (three) times daily as needed for muscle spasms. -     Ortho  Mixed hyperlipidemia  Other orders -     celecoxib (CELEBREX) 200 MG capsule; Take 1 capsule (200 mg total) by mouth daily. With food       I have discontinued Peter Congo C. Longbottom's risedronate and predniSONE. I am also having her start on celecoxib. Additionally, I am having her maintain her multivitamin with minerals, calcium citrate-vitamin D, LORazepam, trimethoprim, pravastatin, levothyroxine, sertraline, and cyclobenzaprine.  Allergies as of  02/11/2019      Reactions   Cholestatin    Tetanus Toxoids Swelling   Redness at site of injection      Medication List       Accurate as of February 11, 2019  7:21 PM. If you have any questions, ask your nurse or doctor.        STOP taking these medications   predniSONE 10 MG (21) Tbpk tablet Commonly known as: STERAPRED UNI-PAK 21 TAB Stopped by: Claretta Fraise, MD   risedronate 150 MG tablet Commonly known as: Actonel Stopped by: Claretta Fraise, MD     TAKE these medications   calcium citrate-vitamin D 315-200 MG-UNIT tablet Commonly known as: CITRACAL+D Take 2 tablets by mouth 2 (two) times daily.   celecoxib 200 MG capsule Commonly known as: CeleBREX Take 1 capsule (200 mg total) by mouth daily. With food Started by: Claretta Fraise, MD   cyclobenzaprine 5 MG tablet Commonly known as: FLEXERIL Take 1 tablet (5 mg total) by mouth 3 (three) times daily as needed for muscle spasms.   levothyroxine 75 MCG tablet Commonly known as: SYNTHROID Take 1 tablet (75 mcg total) by mouth daily before breakfast. Name Brand Only   LORazepam 0.5 MG tablet Commonly known as: ATIVAN Take 0.5 mg by mouth every 8 (eight) hours.   multivitamin with minerals tablet Take 1 tablet by mouth daily.   pravastatin 40 MG tablet Commonly known as: PRAVACHOL Take 1 tablet (40 mg total) by mouth daily.   sertraline 100 MG tablet Commonly known as: ZOLOFT Take 1 tablet (100 mg total) by mouth at bedtime. TAKE ONE TABLET (50 MG DOSE) BY MOUTH DAILY.   trimethoprim 100 MG tablet Commonly known as: TRIMPEX Take 100 mg by mouth daily.      Declines PT & Ortho due to husband's upcoming surgery. Will schedule at pt.'s request  Follow-up: Return in about 6 weeks (around 03/25/2019).  Claretta Fraise, M.D.

## 2019-03-17 ENCOUNTER — Telehealth: Payer: Self-pay | Admitting: Family Medicine

## 2019-03-18 NOTE — Telephone Encounter (Signed)
Patient is requesting results of DEXA scan, please review results and advise on what to tell the patient at this time.

## 2019-03-18 NOTE — Telephone Encounter (Signed)
Patient aware this can not be released to my chart. She still wanting results.

## 2019-03-20 ENCOUNTER — Other Ambulatory Visit: Payer: Self-pay | Admitting: Family Medicine

## 2019-03-20 MED ORDER — ALENDRONATE SODIUM 70 MG PO TABS: 70.0000 mg | ORAL_TABLET | ORAL | 11 refills | Status: DC

## 2019-03-20 NOTE — Telephone Encounter (Signed)
Patient states that she has already picked up the fosamax and would like to try it first

## 2019-03-20 NOTE — Telephone Encounter (Signed)
Left message with husband to call back.

## 2019-03-20 NOTE — Telephone Encounter (Signed)
There was evidence for osteoporosis. I sent in fosamax. One a week should help prevent fracture. WS

## 2019-03-20 NOTE — Telephone Encounter (Signed)
I prefer the actonel. It can cost more though. Use it if it isn't too expensive

## 2019-03-20 NOTE — Telephone Encounter (Signed)
Pt says you called in Actonel in Dec, she hasn't started yet. Which one do you want her to take Actonel or Fosamax

## 2019-03-27 DIAGNOSIS — D2371 Other benign neoplasm of skin of right lower limb, including hip: Secondary | ICD-10-CM | POA: Diagnosis not present

## 2019-03-27 DIAGNOSIS — M79671 Pain in right foot: Secondary | ICD-10-CM | POA: Diagnosis not present

## 2019-04-01 ENCOUNTER — Ambulatory Visit: Payer: Medicare Other

## 2019-04-10 ENCOUNTER — Ambulatory Visit: Payer: Medicare Other

## 2019-04-30 ENCOUNTER — Other Ambulatory Visit: Payer: Self-pay

## 2019-05-01 ENCOUNTER — Ambulatory Visit (INDEPENDENT_AMBULATORY_CARE_PROVIDER_SITE_OTHER): Payer: Medicare Other | Admitting: Family Medicine

## 2019-05-01 ENCOUNTER — Encounter: Payer: Self-pay | Admitting: Family Medicine

## 2019-05-01 ENCOUNTER — Other Ambulatory Visit: Payer: Medicare Other

## 2019-05-01 ENCOUNTER — Other Ambulatory Visit: Payer: Self-pay

## 2019-05-01 VITALS — BP 148/86 | HR 78 | Temp 97.1°F | Ht 62.0 in | Wt 148.4 lb

## 2019-05-01 DIAGNOSIS — E782 Mixed hyperlipidemia: Secondary | ICD-10-CM | POA: Diagnosis not present

## 2019-05-01 DIAGNOSIS — F514 Sleep terrors [night terrors]: Secondary | ICD-10-CM | POA: Diagnosis not present

## 2019-05-01 DIAGNOSIS — R6889 Other general symptoms and signs: Secondary | ICD-10-CM | POA: Diagnosis not present

## 2019-05-01 DIAGNOSIS — R451 Restlessness and agitation: Secondary | ICD-10-CM

## 2019-05-01 DIAGNOSIS — R030 Elevated blood-pressure reading, without diagnosis of hypertension: Secondary | ICD-10-CM | POA: Diagnosis not present

## 2019-05-01 DIAGNOSIS — E039 Hypothyroidism, unspecified: Secondary | ICD-10-CM | POA: Diagnosis not present

## 2019-05-01 DIAGNOSIS — F411 Generalized anxiety disorder: Secondary | ICD-10-CM | POA: Diagnosis not present

## 2019-05-01 LAB — URINALYSIS
Bilirubin, UA: NEGATIVE
Glucose, UA: NEGATIVE
Ketones, UA: NEGATIVE
Nitrite, UA: POSITIVE — AB
Protein,UA: NEGATIVE
Specific Gravity, UA: 1.02 (ref 1.005–1.030)
Urobilinogen, Ur: 0.2 mg/dL (ref 0.2–1.0)
pH, UA: 5.5 (ref 5.0–7.5)

## 2019-05-01 MED ORDER — PRAVASTATIN SODIUM 40 MG PO TABS: 40.0000 mg | ORAL_TABLET | Freq: Every day | ORAL | 1 refills | Status: DC

## 2019-05-01 MED ORDER — LEVOTHYROXINE SODIUM 75 MCG PO TABS: 75.0000 ug | ORAL_TABLET | Freq: Every day | ORAL | 1 refills | Status: DC

## 2019-05-01 MED ORDER — LORAZEPAM 0.5 MG PO TABS: 0.5000 mg | ORAL_TABLET | Freq: Three times a day (TID) | ORAL | 2 refills | Status: DC

## 2019-05-01 NOTE — Patient Instructions (Signed)
Check your blood pressure twice daily while at rest. Do three readings back-to-back each time. Write all of the readings down for a week and then drop them off to me at the office.

## 2019-05-01 NOTE — Progress Notes (Signed)
Subjective:  Patient ID: Cynthia Garrett, female    DOB: 11-03-1940  Age: 79 y.o. MRN: [id number]  CC: Follow-up (6 month)   HPI NICHOLA CIESLINSKI presents for follow-up of elevated cholesterol. Doing well without complaints on current medication. Denies side effects of statin including myalgia and arthralgia and nausea. Also in today for liver function testing. Currently no chest pain, shortness of breath or other cardiovascular related symptoms noted.  Patient presents for follow-up on  thyroid. The patient has a history of hypothyroidism for many years. It has been stable recently. Pt. denies any change in  voice, loss of hair, heat or cold intolerance. Energy level has been adequate to good. Patient denies constipation and diarrhea. No myxedema. Medication is as noted below. Verified that pt is taking it daily on an empty stomach. Well tolerated.  Patient has been having symptoms that she researched and discovered are night terrors.  She states she has been screaming in her sleep.  She has been restless in bed and on 2 occasions has fallen out of bed.  She denies injury.  However she is very agitated and upset as result of these.  She thought it might be the Zoloft so she tapered herself off of that.  She was having a lot of agitation that has continued but is diminished.  She has taken lorazepam in the past for the agitation.  She relates these problems back to being trapped in the house too long as result of the Covid isolation.  She is continuing to self isolate but is getting her Covid vaccinations soon.   History Catalaya has a past medical history of Allergic rhinitis, seasonal, Allergy, Anxiety, Cataract, Headache(784.0), Hyperlipidemia, Hypothyroidism, Left breast mass, Lung nodules, Osteopenia, PONV (postoperative nausea and vomiting), and Thyroid disease.   She has a past surgical history that includes Hysterotomy; Colonoscopy; Cataract extraction w/PHACO (06/26/2011); Cataract  extraction w/PHACO (07/06/2011); Breast lumpectomy with radioactive seed localization (Left, 12/06/2016); and Breast excisional biopsy (Left, 2018).   Her family history includes COPD in her brother; Diabetes in her sister; Heart disease in her mother; Hypertension in her mother and sister.She reports that she quit smoking about 45 years ago. Her smoking use included cigarettes. She has a 2.50 pack-year smoking history. She has never used smokeless tobacco. She reports previous alcohol use. She reports that she does not use drugs.  Current Outpatient Medications on File Prior to Visit  Medication Sig Dispense Refill  . alendronate (FOSAMAX) 70 MG tablet Take 1 tablet (70 mg total) by mouth every 7 (seven) days. Take with a full glass of water on an empty stomach. Do not lay down for at least 2 hours 4 tablet 11  . calcium citrate-vitamin D (CITRACAL+D) 315-200 MG-UNIT per tablet Take 2 tablets by mouth 2 (two) times daily.      . Multiple Vitamins-Minerals (MULTIVITAMIN WITH MINERALS) tablet Take 1 tablet by mouth daily.      Marland Kitchen FLUAD QUADRIVALENT 0.5 ML injection      No current facility-administered medications on file prior to visit.    ROS Review of Systems  Constitutional: Negative.   HENT: Negative for congestion.   Eyes: Negative for visual disturbance.  Respiratory: Negative for shortness of breath.   Cardiovascular: Negative for chest pain.  Gastrointestinal: Negative for abdominal pain, constipation, diarrhea, nausea and vomiting.  Genitourinary: Negative for difficulty urinating.  Musculoskeletal: Negative for arthralgias and myalgias.  Neurological: Negative for headaches.  Psychiatric/Behavioral: Positive for agitation and dysphoric mood. Negative  for sleep disturbance.    Objective:  BP (!) 148/86   Pulse 78   Temp (!) 97.1 F (36.2 C) (Temporal)   Ht 5' 2"  (1.575 m)   Wt 148 lb 6.4 oz (67.3 kg)   BMI 27.14 kg/m   BP Readings from Last 3 Encounters:  05/01/19 (!)  148/86  02/11/19 137/87  01/16/19 138/85    Wt Readings from Last 3 Encounters:  05/01/19 148 lb 6.4 oz (67.3 kg)  02/11/19 148 lb (67.1 kg)  01/16/19 146 lb 9.6 oz (66.5 kg)     Physical Exam Constitutional:      General: She is not in acute distress.    Appearance: She is well-developed.  HENT:     Head: Normocephalic and atraumatic.     Right Ear: External ear normal.     Left Ear: External ear normal.     Nose: Nose normal.  Eyes:     Conjunctiva/sclera: Conjunctivae normal.     Pupils: Pupils are equal, round, and reactive to light.  Neck:     Thyroid: No thyromegaly.  Cardiovascular:     Rate and Rhythm: Normal rate and regular rhythm.     Heart sounds: Normal heart sounds. No murmur.  Pulmonary:     Effort: Pulmonary effort is normal. No respiratory distress.     Breath sounds: Normal breath sounds. No wheezing or rales.  Abdominal:     General: Bowel sounds are normal. There is no distension.     Palpations: Abdomen is soft.     Tenderness: There is no abdominal tenderness.  Musculoskeletal:     Cervical back: Normal range of motion and neck supple.  Lymphadenopathy:     Cervical: No cervical adenopathy.  Skin:    General: Skin is warm and dry.  Neurological:     Mental Status: She is alert and oriented to person, place, and time.     Deep Tendon Reflexes: Reflexes are normal and symmetric.  Psychiatric:        Behavior: Behavior normal.        Thought Content: Thought content normal.        Judgment: Judgment normal.       Lab Results  Component Value Date   WBC 6.2 11/30/2016   HGB 13.9 11/30/2016   HCT 41.9 11/30/2016   PLT 261 11/30/2016   GLUCOSE 104 (H) 10/29/2018   ALT 37 (H) 10/29/2018   AST 42 (H) 10/29/2018   NA 142 10/29/2018   K 4.4 10/29/2018   CL 99 10/29/2018   CREATININE 0.88 10/29/2018   BUN 12 10/29/2018   CO2 26 10/29/2018   TSH 1.480 10/29/2018    MM 3D SCREEN BREAST BILATERAL  Result Date: 11/05/2018 CLINICAL DATA:   Screening. EXAM: DIGITAL SCREENING BILATERAL MAMMOGRAM WITH TOMO AND CAD COMPARISON:  Previous exam(s). ACR Breast Density Category c: The breast tissue is heterogeneously dense, which may obscure small masses. FINDINGS: Postsurgical changes are seen in the left breast. There are no findings suspicious for malignancy in either breast. Images were processed with CAD. IMPRESSION: No mammographic evidence of malignancy. A result letter of this screening mammogram will be mailed directly to the patient. RECOMMENDATION: Screening mammogram in one year. (Code:SM-B-01Y) BI-RADS CATEGORY  1: Negative. Electronically Signed   By: Zerita Boers M.D.   On: 11/05/2018 15:01    Assessment & Plan:   Pacey was seen today for follow-up.  Diagnoses and all orders for this visit:  Anxiety state -  CBC with Differential/Platelet -     CMP14+EGFR  Hypothyroidism, unspecified type -     CBC with Differential/Platelet -     CMP14+EGFR -     TSH + free T4 -     Urinalysis  Mixed hyperlipidemia -     CBC with Differential/Platelet -     CMP14+EGFR -     Lipid panel  Night terrors, adult -     CBC with Differential/Platelet -     CMP14+EGFR  Elevated blood pressure reading in office without diagnosis of hypertension  Other orders -     pravastatin (PRAVACHOL) 40 MG tablet; Take 1 tablet (40 mg total) by mouth daily. -     levothyroxine (SYNTHROID) 75 MCG tablet; Take 1 tablet (75 mcg total) by mouth daily before breakfast. Name Brand Only -     LORazepam (ATIVAN) 0.5 MG tablet; Take 1 tablet (0.5 mg total) by mouth every 8 (eight) hours.   I have discontinued Peter Congo C. Way's trimethoprim, sertraline, cyclobenzaprine, and celecoxib. I have also changed her LORazepam. Additionally, I am having her maintain her multivitamin with minerals, calcium citrate-vitamin D, alendronate, Fluad Quadrivalent, pravastatin, and levothyroxine.  Meds ordered this encounter  Medications  . pravastatin (PRAVACHOL)  40 MG tablet    Sig: Take 1 tablet (40 mg total) by mouth daily.    Dispense:  90 tablet    Refill:  1  . levothyroxine (SYNTHROID) 75 MCG tablet    Sig: Take 1 tablet (75 mcg total) by mouth daily before breakfast. Name Brand Only    Dispense:  90 tablet    Refill:  1  . LORazepam (ATIVAN) 0.5 MG tablet    Sig: Take 1 tablet (0.5 mg total) by mouth every 8 (eight) hours.    Dispense:  60 tablet    Refill:  2   Patient is followed for thyroid.  In addition to depression.  She is tapered herself off of her antidepressant.  It is really unclear at this time whether that was based on fear that it was causing the night terrors.  However she says that her agitation actually got better when she discontinued the medication.  It did not go away completely and she is taken Ativan in the past and requests refills today.  Think she does have some deep-seated anxiety.  We will try her temporarily on the Ativan and see if we can wean that and put her on a bit more long-term medicine for anxiety such as Cymbalta at her follow-up.  Seems more appropriate once we get her through the acute phase of treatment.  Follow-up: Return in about 6 weeks (around 06/12/2019).  Claretta Fraise, M.D.

## 2019-05-02 LAB — CMP14+EGFR
ALT: 31 IU/L (ref 0–32)
AST: 39 IU/L (ref 0–40)
Albumin/Globulin Ratio: 1.9 (ref 1.2–2.2)
Albumin: 4.7 g/dL (ref 3.7–4.7)
Alkaline Phosphatase: 92 IU/L (ref 39–117)
BUN/Creatinine Ratio: 27 (ref 12–28)
BUN: 21 mg/dL (ref 8–27)
Bilirubin Total: 0.4 mg/dL (ref 0.0–1.2)
CO2: 23 mmol/L (ref 20–29)
Calcium: 10 mg/dL (ref 8.7–10.3)
Chloride: 100 mmol/L (ref 96–106)
Creatinine, Ser: 0.77 mg/dL (ref 0.57–1.00)
GFR calc Af Amer: 85 mL/min/{1.73_m2} (ref >59)
GFR calc non Af Amer: 74 mL/min/{1.73_m2} (ref >59)
Globulin, Total: 2.5 g/dL (ref 1.5–4.5)
Glucose: 94 mg/dL (ref 65–99)
Potassium: 4.4 mmol/L (ref 3.5–5.2)
Sodium: 140 mmol/L (ref 134–144)
Total Protein: 7.2 g/dL (ref 6.0–8.5)

## 2019-05-02 LAB — CBC WITH DIFFERENTIAL/PLATELET
Basophils Absolute: 0.1 10*3/uL (ref 0.0–0.2)
Basos: 1 %
EOS (ABSOLUTE): 0.1 10*3/uL (ref 0.0–0.4)
Eos: 2 %
Hematocrit: 39.5 % (ref 34.0–46.6)
Hemoglobin: 13.2 g/dL (ref 11.1–15.9)
Immature Grans (Abs): 0 10*3/uL (ref 0.0–0.1)
Immature Granulocytes: 0 %
Lymphocytes Absolute: 2 10*3/uL (ref 0.7–3.1)
Lymphs: 29 %
MCH: 30.3 pg (ref 26.6–33.0)
MCHC: 33.4 g/dL (ref 31.5–35.7)
MCV: 91 fL (ref 79–97)
Monocytes Absolute: 0.6 10*3/uL (ref 0.1–0.9)
Monocytes: 8 %
Neutrophils Absolute: 4.1 10*3/uL (ref 1.4–7.0)
Neutrophils: 60 %
Platelets: 249 10*3/uL (ref 150–450)
RBC: 4.36 x10E6/uL (ref 3.77–5.28)
RDW: 12.2 % (ref 11.7–15.4)
WBC: 6.9 10*3/uL (ref 3.4–10.8)

## 2019-05-02 LAB — LIPID PANEL
Chol/HDL Ratio: 3.2 ratio (ref 0.0–4.4)
Cholesterol, Total: 162 mg/dL (ref 100–199)
HDL: 51 mg/dL (ref >39)
LDL Chol Calc (NIH): 78 mg/dL (ref 0–99)
Triglycerides: 198 mg/dL — ABNORMAL HIGH (ref 0–149)
VLDL Cholesterol Cal: 33 mg/dL (ref 5–40)

## 2019-05-02 LAB — TSH+FREE T4
Free T4: 1.14 ng/dL (ref 0.82–1.77)
TSH: 0.335 u[IU]/mL — ABNORMAL LOW (ref 0.450–4.500)

## 2019-05-05 LAB — URINE CULTURE

## 2019-05-06 ENCOUNTER — Other Ambulatory Visit: Payer: Self-pay | Admitting: Family Medicine

## 2019-05-06 ENCOUNTER — Telehealth: Payer: Self-pay | Admitting: Family Medicine

## 2019-05-06 MED ORDER — CEFUROXIME AXETIL 250 MG PO TABS: 250.0000 mg | ORAL_TABLET | Freq: Two times a day (BID) | ORAL | 0 refills | Status: AC

## 2019-05-06 NOTE — Telephone Encounter (Signed)
Patient aware.

## 2019-05-06 NOTE — Telephone Encounter (Signed)
Please review culture and advise on medication ordered.Thanks

## 2019-05-06 NOTE — Telephone Encounter (Signed)
Patient wants you to review last thyroid number to see if medication needs an adjustment?

## 2019-05-06 NOTE — Telephone Encounter (Signed)
Please let her know that, even though the TSH was minimally low, the Free T4 was in the middle of its range. As a result the two balance each other, so no change would be helpful or needed. Thanks, WS

## 2019-05-06 NOTE — Telephone Encounter (Signed)
Aware. 

## 2019-05-06 NOTE — Telephone Encounter (Signed)
I sent in the requested prescription 

## 2019-05-16 ENCOUNTER — Telehealth: Payer: Self-pay | Admitting: Family Medicine

## 2019-05-16 DIAGNOSIS — N390 Urinary tract infection, site not specified: Secondary | ICD-10-CM | POA: Diagnosis not present

## 2019-05-16 DIAGNOSIS — R311 Benign essential microscopic hematuria: Secondary | ICD-10-CM | POA: Diagnosis not present

## 2019-05-16 DIAGNOSIS — R350 Frequency of micturition: Secondary | ICD-10-CM | POA: Diagnosis not present

## 2019-05-16 NOTE — Telephone Encounter (Signed)
Patient will call [facility] to see if they can take care of her uti today.  If they are unavailable, she will call back to [facility] to ask if back up provider can help her.

## 2019-05-16 NOTE — Telephone Encounter (Signed)
Left message for patient to call . What symptoms are you having and when did they begin?

## 2019-05-16 NOTE — Telephone Encounter (Signed)
Pt says she recently saw Dr Livia Snellen for a UTI and was prescribed an antibiotic. Pt says she has taken the antibiotic for 10 days as directed but now says UTI is worse. Wants to know if Dr Livia Snellen can send her something else to pharmacy.

## 2019-06-12 ENCOUNTER — Encounter: Payer: Self-pay | Admitting: Family Medicine

## 2019-06-12 ENCOUNTER — Ambulatory Visit (INDEPENDENT_AMBULATORY_CARE_PROVIDER_SITE_OTHER): Payer: Medicare Other | Admitting: Family Medicine

## 2019-06-12 ENCOUNTER — Other Ambulatory Visit: Payer: Self-pay

## 2019-06-12 VITALS — BP 136/79 | HR 79 | Temp 98.0°F | Ht 62.0 in | Wt 148.0 lb

## 2019-06-12 DIAGNOSIS — N390 Urinary tract infection, site not specified: Secondary | ICD-10-CM

## 2019-06-12 DIAGNOSIS — F514 Sleep terrors [night terrors]: Secondary | ICD-10-CM | POA: Diagnosis not present

## 2019-06-12 DIAGNOSIS — I739 Peripheral vascular disease, unspecified: Secondary | ICD-10-CM | POA: Diagnosis not present

## 2019-06-12 DIAGNOSIS — Z79899 Other long term (current) drug therapy: Secondary | ICD-10-CM

## 2019-06-12 MED ORDER — DULOXETINE HCL 30 MG PO CPEP: 30.0000 mg | ORAL_CAPSULE | Freq: Every day | ORAL | 1 refills | Status: DC

## 2019-06-12 MED ORDER — LORAZEPAM 0.5 MG PO TABS: 0.2500 mg | ORAL_TABLET | Freq: Two times a day (BID) | ORAL | 0 refills | Status: DC

## 2019-06-12 NOTE — Progress Notes (Signed)
Subjective:  Patient ID: Cynthia Garrett, female    DOB: May 13, 1940  Age: 79 y.o. MRN: [id number]  CC: Follow-up (6 week)   HPI MARISELLA SAGGIO presents for recheck of her night panic/terrors. Occurred 2 times shortly after starting the new medication, ativan. She also went back to zoloft.  nonNo attacks recently. Pt. Sleeping faily well now.On those two occasions she awoke in the night hollering, but not as terrified as previously.  Pt. Continues to take lorazepam 1/2 tab BID to help with her anxiety and panic.   She was told by the insurance nurse that she had ABI on right of 0.74 and on left of 0.81.   Pt. Saw urology for recurrent UTI and is following their recommendation now to take trimethoprin daily.   Depression screen [facility] 2/9 06/12/2019 05/01/2019 02/11/2019  Decreased Interest 1 0 0  Down, Depressed, Hopeless 1 0 0  PHQ - 2 Score 2 0 0  Altered sleeping 3 - -  Tired, decreased energy 1 - -  Change in appetite 1 - -  Feeling bad or failure about yourself  0 - -  Trouble concentrating 0 - -  Moving slowly or fidgety/restless 0 - -  Suicidal thoughts 0 - -  PHQ-9 Score 7 - -  Difficult doing work/chores Somewhat difficult - -    History Arii has a past medical history of Allergic rhinitis, seasonal, Allergy, Anxiety, Cataract, Headache(784.0), Hyperlipidemia, Hypothyroidism, Left breast mass, Lung nodules, Osteopenia, PONV (postoperative nausea and vomiting), and Thyroid disease.   She has a past surgical history that includes Hysterotomy; Colonoscopy; Cataract extraction w/PHACO (06/26/2011); Cataract extraction w/PHACO (07/06/2011); Breast lumpectomy with radioactive seed localization (Left, 12/06/2016); and Breast excisional biopsy (Left, 2018).   Her family history includes COPD in her brother; Diabetes in her sister; Heart disease in her mother; Hypertension in her mother and sister.She reports that she quit smoking about 46 years ago. Her smoking use included cigarettes.  She has a 2.50 pack-year smoking history. She has never used smokeless tobacco. She reports previous alcohol use. She reports that she does not use drugs.    ROS Review of Systems  Constitutional: Negative for fever.  HENT: Negative for congestion, rhinorrhea and sore throat.   Respiratory: Negative for cough and shortness of breath.   Cardiovascular: Negative for chest pain and palpitations.  Gastrointestinal: Negative for abdominal pain.  Musculoskeletal: Negative for arthralgias and myalgias.    Objective:  BP 136/79   Pulse 79   Temp 98 F (36.7 C) (Temporal)   Ht 5\' 2"  (1.575 m)   Wt 148 lb (67.1 kg)   BMI 27.07 kg/m   BP Readings from Last 3 Encounters:  06/12/19 136/79  05/01/19 (!) 148/86  02/11/19 137/87    Wt Readings from Last 3 Encounters:  06/12/19 148 lb (67.1 kg)  05/01/19 148 lb 6.4 oz (67.3 kg)  02/11/19 148 lb (67.1 kg)     Physical Exam Constitutional:      General: She is not in acute distress.    Appearance: She is well-developed.  Cardiovascular:     Rate and Rhythm: Normal rate and regular rhythm.  Pulmonary:     Breath sounds: Normal breath sounds.  Skin:    General: Skin is warm and dry.  Neurological:     Mental Status: She is alert and oriented to person, place, and time.       Assessment & Plan:   Amarise was seen today for follow-up.  Diagnoses  and all orders for this visit:  Night terrors, adult  PAD (peripheral artery disease) ([place])  Chronic UTI (urinary tract infection)  Controlled substance agreement signed  Other orders -     LORazepam (ATIVAN) 0.5 MG tablet; Take 0.5 tablets (0.25 mg total) by mouth 2 (two) times daily. -     DULoxetine (CYMBALTA) 30 MG capsule; Take 1 capsule (30 mg total) by mouth daily with supper.     We bagan tapering her back off of the ativan today by decreasing bby 1/2 tab daily. This should be aided by cymbalta. She will again DC the zoloft.   I have changed Raysha C. Funderburke's  LORazepam. I am also having her start on DULoxetine. Additionally, I am having her maintain her multivitamin with minerals, calcium citrate-vitamin D, alendronate, Fluad Quadrivalent, pravastatin, levothyroxine, and trimethoprim.  Allergies as of 06/12/2019      Reactions   Cholestatin    Tetanus Toxoids Swelling   Redness at site of injection   Sertraline Anxiety      Medication List       Accurate as of June 12, 2019 11:59 PM. If you have any questions, ask your nurse or doctor.        alendronate 70 MG tablet Commonly known as: FOSAMAX Take 1 tablet (70 mg total) by mouth every 7 (seven) days. Take with a full glass of water on an empty stomach. Do not lay down for at least 2 hours   calcium citrate-vitamin D 315-200 MG-UNIT tablet Commonly known as: CITRACAL+D Take 2 tablets by mouth 2 (two) times daily.   DULoxetine 30 MG capsule Commonly known as: CYMBALTA Take 1 capsule (30 mg total) by mouth daily with supper. Started by: Claretta Fraise, MD   Fluad Quadrivalent 0.5 ML injection Generic drug: influenza vaccine adjuvanted   levothyroxine 75 MCG tablet Commonly known as: SYNTHROID Take 1 tablet (75 mcg total) by mouth daily before breakfast. Name Brand Only   LORazepam 0.5 MG tablet Commonly known as: ATIVAN Take 0.5 tablets (0.25 mg total) by mouth 2 (two) times daily. What changed:   how much to take  when to take this Changed by: Claretta Fraise, MD   multivitamin with minerals tablet Take 1 tablet by mouth daily.   pravastatin 40 MG tablet Commonly known as: PRAVACHOL Take 1 tablet (40 mg total) by mouth daily.   trimethoprim 100 MG tablet Commonly known as: TRIMPEX Take 100 mg by mouth daily.        Follow-up: Return in about 6 weeks (around 07/24/2019).  Claretta Fraise, M.D.

## 2019-06-18 ENCOUNTER — Ambulatory Visit (INDEPENDENT_AMBULATORY_CARE_PROVIDER_SITE_OTHER): Payer: Medicare Other

## 2019-06-18 DIAGNOSIS — Z0000 Encounter for general adult medical examination without abnormal findings: Secondary | ICD-10-CM

## 2019-06-18 NOTE — Patient Instructions (Addendum)
  [place] Maintenance Summary and Written Plan of Care  Ms. Rowe Robert ,  Thank you for allowing me to perform your Medicare Annual Wellness Visit and for your ongoing commitment to your health.   Health Maintenance & Immunization History Health Maintenance  Topic Date Due  . INFLUENZA VACCINE  10/05/2019  . TETANUS/TDAP  03/06/2020  . COLONOSCOPY  06/29/2020  . DEXA SCAN  Completed  . PNA vac Low Risk Adult  Completed   Immunization History  Administered Date(s) Administered  . Fluad Quad(high Dose 65+) 12/03/2018  . Influenza Split 12/11/2013, 12/20/2016  . Influenza, High Dose Seasonal PF 12/14/2014, 12/15/2015, 12/13/2017, 12/13/2017  . Influenza-Unspecified 12/15/2015  . Moderna SARS-COVID-2 Vaccination 04/08/2019  . Pneumococcal Conjugate-13 06/16/2015  . Pneumococcal Polysaccharide-23 03/06/2005  . Pneumococcal-Unspecified 03/06/2014  . Td 03/06/2010    These are the patient goals that we discussed:  Continue doing exercises  Continue your low sodium, low fat diet   This is a list of Health Maintenance Items that are overdue or due now: There are no preventive care reminders to display for this patient.   Orders/Referrals Placed Today: No orders of the defined types were placed in this encounter.  (Contact our referral department at [phone] if you have not spoken with someone about your referral appointment within the next 5 days)    Follow-up Plan  Dr. Livia Snellen on 07/19/2019

## 2019-06-18 NOTE — Progress Notes (Addendum)
MEDICARE ANNUAL WELLNESS VISIT  06/18/2019  Telephone Visit Disclaimer This Medicare AWV was conducted by telephone due to national recommendations for restrictions regarding the COVID-19 Pandemic (e.g. social distancing).  I verified, using two identifiers, that I am speaking with Cynthia Garrett or their authorized healthcare agent. I discussed the limitations, risks, security, and privacy concerns of performing an evaluation and management service by telephone and the potential availability of an in-person appointment in the future. The patient expressed understanding and agreed to proceed.   Subjective:  Cynthia Garrett is a 79 y.o. female patient of Stacks, Cletus Gash, MD who had a Medicare Annual Wellness Visit today via telephone. Cynthia Garrett is Retired and lives with their spouse. she has two children. she reports that she is socially active and does interact with friends/family regularly. she is minimally physically active and enjoys walking and yoga.  Patient Care Team: Claretta Fraise, MD as PCP - General (Family Medicine)  Advanced Directives 06/18/2019 12/06/2016 11/29/2016 06/20/2011  Does Patient Have a Medical Advance Directive? Yes Yes Yes Patient has advance directive, copy not in chart  Type of Advance Directive Living will [place];Living will Living will;Healthcare Power of [place]  Does patient want to make changes to medical advance directive? No - Patient declined No - Patient declined - -  Copy of [place] in Chart? - No - copy requested - Copy requested from family  Pre-existing out of facility DNR order (yellow form or pink MOST form) - - - No    Hospital Utilization Over the Past 12 Months: # of hospitalizations or ER visits: 0 # of surgeries: 0  Review of Systems    Patient reports that her overall health is unchanged compared to last year.  Pain Assessment Pain : No/denies pain     Current  Medications & Allergies (verified) Allergies as of 06/18/2019      Reactions   Cholestatin    Tetanus Toxoids Swelling   Redness at site of injection   Sertraline Anxiety      Medication List       Accurate as of June 18, 2019  8:54 AM. If you have any questions, ask your nurse or doctor.        STOP taking these medications   alendronate 70 MG tablet Commonly known as: FOSAMAX   Fluad Quadrivalent 0.5 ML injection Generic drug: influenza vaccine adjuvanted     TAKE these medications   calcium citrate-vitamin D 315-200 MG-UNIT tablet Commonly known as: CITRACAL+D Take 2 tablets by mouth 2 (two) times daily.   cholecalciferol 25 MCG (1000 UNIT) tablet Commonly known as: VITAMIN D3 Take 1,000 Units by mouth daily.   DULoxetine 30 MG capsule Commonly known as: CYMBALTA Take 1 capsule (30 mg total) by mouth daily with supper.   levothyroxine 75 MCG tablet Commonly known as: SYNTHROID Take 1 tablet (75 mcg total) by mouth daily before breakfast. Name Brand Only   LORazepam 0.5 MG tablet Commonly known as: ATIVAN Take 0.5 tablets (0.25 mg total) by mouth 2 (two) times daily. What changed:   when to take this  reasons to take this   multivitamin with minerals tablet Take 1 tablet by mouth daily.   pravastatin 40 MG tablet Commonly known as: PRAVACHOL Take 1 tablet (40 mg total) by mouth daily.   trimethoprim 100 MG tablet Commonly known as: TRIMPEX Take 100 mg by mouth daily.  History (reviewed): Past Medical History:  Diagnosis Date  . Allergic rhinitis, seasonal   . Allergy   . Anxiety   . Cataract    extraction  . Headache(784.0)   . Hyperlipidemia   . Hypothyroidism   . Left breast mass   . Lung nodules   . Osteopenia   . PONV (postoperative nausea and vomiting)   . Thyroid disease    Past Surgical History:  Procedure Laterality Date  . BREAST EXCISIONAL BIOPSY Left 2018  . BREAST LUMPECTOMY WITH RADIOACTIVE SEED LOCALIZATION Left  12/06/2016   Procedure: LEFT BREAST LUMPECTOMY WITH RADIOACTIVE SEED LOCALIZATION;  Surgeon: Fanny Skates, MD;  Location: [facility];  Service: General;  Laterality: Left;  . CATARACT EXTRACTION W/PHACO  06/26/2011   Procedure: CATARACT EXTRACTION PHACO AND INTRAOCULAR LENS PLACEMENT (IOC);  Surgeon: Tonny Branch, MD;  Location: [facility];  Service: Ophthalmology;  Laterality: Right;  CDE 10.34  . CATARACT EXTRACTION W/PHACO  07/06/2011   Procedure: CATARACT EXTRACTION PHACO AND INTRAOCULAR LENS PLACEMENT (IOC);  Surgeon: Tonny Branch, MD;  Location: [facility];  Service: Ophthalmology;  Laterality: Left;  CDE:9.92  . COLONOSCOPY    . Hysterotomy     fibroid tumor   Family History  Problem Relation Age of Onset  . Heart disease Mother   . Hypertension Mother   . Diabetes Sister   . Hypertension Sister   . COPD Brother   . Anesthesia problems Neg Hx   . Hypotension Neg Hx   . Malignant hyperthermia Neg Hx   . Pseudochol deficiency Neg Hx   . Colon cancer Neg Hx   . Cancer Neg Hx   . Breast cancer Neg Hx    Social History   Socioeconomic History  . Marital status: Married    Spouse name: Richard  . Number of children: 2  . Years of education: Not on file  . Highest education level: Not on file  Occupational History  . Not on file  Tobacco Use  . Smoking status: Former Smoker    Packs/day: 0.25    Years: 10.00    Pack years: 2.50    Types: Cigarettes    Quit date: 06/07/1973    Years since quitting: 46.0  . Smokeless tobacco: Never Used  . Tobacco comment: Significant second-hand exposure.  Substance and Sexual Activity  . Alcohol use: Not Currently    Alcohol/week: 0.0 standard drinks    Comment: social  . Drug use: No  . Sexual activity: Yes    Birth control/protection: Post-menopausal  Other Topics Concern  . Not on file  Social History Narrative   Originally from [place]. Always lived in [place]. Previously has traveled from [facility] to [place], [place], [place], [place], [place],  [place], & [place]. Has dog. No bird exposure. Had bats in her attic previously that have been removed. No mold or hot tub exposure. Previously worked as an Agricultural consultant in a Restaurant manager, fast food. Previously enjoyed [place]. She has been doing Tai Chi.    Social Determinants of Health   Financial Resource Strain:   . Difficulty of Paying Living Expenses:   Food Insecurity:   . Worried About Charity fundraiser in the Last Year:   . Arboriculturist in the Last Year:   Transportation Needs:   . Film/video editor (Medical):   Marland Kitchen Lack of Transportation (Non-Medical):   Physical Activity:   . Days of Exercise per Week:   . Minutes of Exercise per  Session:   Stress:   . Feeling of Stress :   Social Connections:   . Frequency of Communication with Friends and Family:   . Frequency of Social Gatherings with Friends and Family:   . Attends Religious Services:   . Active Member of Clubs or Organizations:   . Attends Archivist Meetings:   Marland Kitchen Marital Status:     Activities of Daily Living In your present state of health, do you have any difficulty performing the following activities: 06/18/2019  Hearing? N  Vision? N  Difficulty concentrating or making decisions? N  Walking or climbing stairs? N  Dressing or bathing? N  Doing errands, shopping? N  Preparing Food and eating ? N  Using the Toilet? N  In the past six months, have you accidently leaked urine? N  Do you have problems with loss of bowel control? N  Managing your Medications? N  Managing your Finances? N  Housekeeping or managing your Housekeeping? N  Some recent data might be hidden    Patient Education/ Literacy How often do you need to have someone help you when you read instructions, pamphlets, or other written materials from your doctor or pharmacy?: 1 - Never What is the last grade level you completed in school?: 12th  Exercise Current Exercise Habits: Structured exercise class;Home exercise routine,  Type of exercise: walking;yoga, Time (Minutes): 60, Frequency (Times/Week): 2, Weekly Exercise (Minutes/Week): 120, Intensity: Mild, Exercise limited by: None identified  Diet Patient reports consuming 3 meals a day and 2 snack(s) a day Patient reports that her primary diet is: Low Sodium Patient reports that she does have regular access to food.   Depression Screen PHQ 2/9 Scores 06/18/2019 06/12/2019 05/01/2019 02/11/2019 01/16/2019 10/29/2018 10/03/2018  PHQ - 2 Score 0 2 0 0 0 1 3  PHQ- 9 Score - 7 - - - - 6     Fall Risk Fall Risk  06/18/2019 06/12/2019 05/01/2019 02/11/2019 10/29/2018  Falls in the past year? 0 0 1 1 0  Number falls in past yr: - 0 1 0 -  Injury with Fall? - 0 1 1 -  Risk for fall due to : - No Fall Risks History of fall(s) - -  Follow up - Falls evaluation completed Falls evaluation completed Falls prevention discussed -     Objective:  Cynthia Garrett seemed alert and oriented and she participated appropriately during our telephone visit.  Blood Pressure Weight BMI  BP Readings from Last 3 Encounters:  06/12/19 136/79  05/01/19 (!) 148/86  02/11/19 137/87   Wt Readings from Last 3 Encounters:  06/12/19 148 lb (67.1 kg)  05/01/19 148 lb 6.4 oz (67.3 kg)  02/11/19 148 lb (67.1 kg)   BMI Readings from Last 1 Encounters:  06/12/19 27.07 kg/m    *Unable to obtain current vital signs, weight, and BMI due to telephone visit type  Hearing/Vision  . Cynthia Garrett did not seem to have difficulty with hearing/understanding during the telephone conversation . Reports that she has had a formal eye exam by an eye care professional within the past year . Reports that she has not had a formal hearing evaluation within the past year *Unable to fully assess hearing and vision during telephone visit type  Cognitive Function: 6CIT Screen 06/18/2019  What Year? 0 points  What month? 0 points  What time? 0 points  Count back from 20 0 points  Months in reverse 0 points  Repeat  phrase 0 points  Total Score 0   (Normal:0-7, Significant for Dysfunction: >8)  Normal Cognitive Function Screening: Yes   Immunization & Health Maintenance Record Immunization History  Administered Date(s) Administered  . Fluad Quad(high Dose 65+) 12/03/2018  . Influenza Split 12/11/2013, 12/20/2016  . Influenza, High Dose Seasonal PF 12/14/2014, 12/15/2015, 12/13/2017, 12/13/2017  . Influenza-Unspecified 12/15/2015  . Moderna SARS-COVID-2 Vaccination 04/08/2019  . Pneumococcal Conjugate-13 06/16/2015  . Pneumococcal Polysaccharide-23 03/06/2005  . Pneumococcal-Unspecified 03/06/2014  . Td 03/06/2010    Health Maintenance  Topic Date Due  . INFLUENZA VACCINE  10/05/2019  . TETANUS/TDAP  03/06/2020  . COLONOSCOPY  06/29/2020  . DEXA SCAN  Completed  . PNA vac Low Risk Adult  Completed       Assessment  This is a routine wellness examination for Cynthia Garrett.  Health Maintenance: Due or Overdue There are no preventive care reminders to display for this patient.  Cynthia Garrett does not need a referral for Community Assistance: Care Management:   no Social Work:    no Prescription Assistance:  no Nutrition/Diabetes Education:  no   Plan:  Personalized Goals  Continue exercising  Continue monitoring low sodium, low fat intake  Personalized Health Maintenance & Screening Recommendations    Lung Cancer Screening Recommended: no (Low Dose CT Chest recommended if Age 19-80 years, 30 pack-year currently smoking OR have quit w/in past 15 years) Hepatitis C Screening recommended: no HIV Screening recommended: no  Advanced Directives: Written information was not prepared per patient's request.  Referrals & Orders No orders of the defined types were placed in this encounter.   Follow-up Plan . Follow-up with Claretta Fraise, MD as planned . Schedule 07/19/2019    I have personally reviewed and noted the following in the patient's chart:   . Medical and  social history . Use of alcohol, tobacco or illicit drugs  . Current medications and supplements . Functional ability and status . Nutritional status . Physical activity . Advanced directives . List of other physicians . Hospitalizations, surgeries, and ER visits in previous 12 months . Vitals . Screenings to include cognitive, depression, and falls . Referrals and appointments  In addition, I have reviewed and discussed with Cynthia Garrett certain preventive protocols, quality metrics, and best practice recommendations. A written personalized care plan for preventive services as well as general preventive health recommendations is available and can be mailed to the patient at her request.      Maud Deed [facility]  3/84/5364  I have reviewed and agree with the above AWV documentation.  Claretta Fraise, M.D.

## 2019-07-23 ENCOUNTER — Ambulatory Visit (INDEPENDENT_AMBULATORY_CARE_PROVIDER_SITE_OTHER): Payer: Medicare Other | Admitting: Family Medicine

## 2019-07-23 ENCOUNTER — Ambulatory Visit: Payer: Medicare Other | Admitting: Family Medicine

## 2019-07-23 ENCOUNTER — Other Ambulatory Visit: Payer: Self-pay

## 2019-07-23 ENCOUNTER — Encounter: Payer: Self-pay | Admitting: Family Medicine

## 2019-07-23 VITALS — BP 131/84 | HR 85 | Temp 97.1°F | Resp 20 | Ht 62.0 in | Wt 146.0 lb

## 2019-07-23 DIAGNOSIS — F514 Sleep terrors [night terrors]: Secondary | ICD-10-CM

## 2019-07-23 DIAGNOSIS — F411 Generalized anxiety disorder: Secondary | ICD-10-CM

## 2019-07-23 NOTE — Progress Notes (Signed)
Subjective:  Patient ID: Cynthia Garrett, female    DOB: November 06, 1940  Age: 79 y.o. MRN: [id number]  CC: No chief complaint on file.   HPI Cynthia Garrett presents for Cynthia Garrett tells me that she has been herself off of the Ativan.  After removing the controlled substance agreement she decided that this drug she thought was innocuous because of its cheaper price was actually a serious medicine.  She states that since starting the Cymbalta she has had some vivid dreams.  They are manageable.  And she has had no further tears.  Medicine agrees with her.  No side effects otherwise.  Depression screen [facility] 2/9 07/23/2019 06/18/2019 06/12/2019  Decreased Interest 0 0 1  Down, Depressed, Hopeless 0 0 1  PHQ - 2 Score 0 0 2  Altered sleeping - (No Data) 3  Tired, decreased energy - - 1  Change in appetite - - 1  Feeling bad or failure about yourself  - - 0  Trouble concentrating - - 0  Moving slowly or fidgety/restless - - 0  Suicidal thoughts - - 0  PHQ-9 Score - - 7  Difficult doing work/chores - - Somewhat difficult    History Cynthia Garrett has a past medical history of Allergic rhinitis, seasonal, Allergy, Anxiety, Cataract, Headache(784.0), Hyperlipidemia, Hypothyroidism, Left breast mass, Lung nodules, Osteopenia, PONV (postoperative nausea and vomiting), and Thyroid disease.   She has a past surgical history that includes Hysterotomy; Colonoscopy; Cataract extraction w/PHACO (06/26/2011); Cataract extraction w/PHACO (07/06/2011); Breast lumpectomy with radioactive seed localization (Left, 12/06/2016); and Breast excisional biopsy (Left, 2018).   Her family history includes COPD in her brother; Diabetes in her sister; Heart disease in her mother; Hypertension in her mother and sister.She reports that she quit smoking about 46 years ago. Her smoking use included cigarettes. She has a 2.50 pack-year smoking history. She has never used smokeless tobacco. She reports previous alcohol use. She reports that  she does not use drugs.    ROS Review of Systems Noncontributory Objective:  BP 131/84   Pulse 85   Temp (!) 97.1 F (36.2 C) (Temporal)   Resp 20   Ht 5\' 2"  (1.575 m)   Wt 146 lb (66.2 kg)   SpO2 99%   BMI 26.70 kg/m   BP Readings from Last 3 Encounters:  07/23/19 131/84  06/12/19 136/79  05/01/19 (!) 148/86    Wt Readings from Last 3 Encounters:  07/23/19 146 lb (66.2 kg)  06/12/19 148 lb (67.1 kg)  05/01/19 148 lb 6.4 oz (67.3 kg)     Physical Exam Constitutional:      General: She is not in acute distress.    Appearance: She is well-developed.  Cardiovascular:     Rate and Rhythm: Normal rate and regular rhythm.  Pulmonary:     Breath sounds: Normal breath sounds.  Skin:    General: Skin is warm and dry.  Neurological:     Mental Status: She is alert and oriented to person, place, and time.       Assessment & Plan:   Diagnoses and all orders for this visit:  Night terrors, adult  Anxiety state       I am having Cynthia Garrett Robert maintain her multivitamin with minerals, calcium citrate-vitamin D, pravastatin, levothyroxine, trimethoprim, LORazepam, DULoxetine, and cholecalciferol.  Allergies as of 07/23/2019      Reactions   Cholestatin    Tetanus Toxoids Swelling   Redness at site of injection   Sertraline  Anxiety      Medication List       Accurate as of Jul 23, 2019  9:43 PM. If you have any questions, ask your nurse or doctor.        calcium citrate-vitamin D 315-200 MG-UNIT tablet Commonly known as: CITRACAL+D Take 2 tablets by mouth 2 (two) times daily.   cholecalciferol 25 MCG (1000 UNIT) tablet Commonly known as: VITAMIN D3 Take 1,000 Units by mouth daily.   DULoxetine 30 MG capsule Commonly known as: CYMBALTA Take 1 capsule (30 mg total) by mouth daily with supper.   levothyroxine 75 MCG tablet Commonly known as: SYNTHROID Take 1 tablet (75 mcg total) by mouth daily before breakfast. Name Brand Only   LORazepam  0.5 MG tablet Commonly known as: ATIVAN Take 0.5 tablets (0.25 mg total) by mouth 2 (two) times daily. What changed:   when to take this  reasons to take this   multivitamin with minerals tablet Take 1 tablet by mouth daily.   pravastatin 40 MG tablet Commonly known as: PRAVACHOL Take 1 tablet (40 mg total) by mouth daily.   trimethoprim 100 MG tablet Commonly known as: TRIMPEX Take 100 mg by mouth daily.        Follow-up: Return in about 3 months (around 10/23/2019).  Claretta Fraise, M.D.

## 2019-07-26 ENCOUNTER — Other Ambulatory Visit: Payer: Self-pay | Admitting: Family Medicine

## 2019-08-05 ENCOUNTER — Other Ambulatory Visit: Payer: Self-pay | Admitting: Family Medicine

## 2019-08-11 ENCOUNTER — Other Ambulatory Visit: Payer: Self-pay

## 2019-08-11 ENCOUNTER — Encounter: Payer: Self-pay | Admitting: Family Medicine

## 2019-08-11 ENCOUNTER — Ambulatory Visit (INDEPENDENT_AMBULATORY_CARE_PROVIDER_SITE_OTHER): Payer: Medicare Other | Admitting: Family Medicine

## 2019-08-11 VITALS — BP 135/84 | HR 76 | Temp 97.2°F | Resp 20 | Ht 62.0 in | Wt 144.0 lb

## 2019-08-11 DIAGNOSIS — H6122 Impacted cerumen, left ear: Secondary | ICD-10-CM

## 2019-08-11 NOTE — Progress Notes (Signed)
No chief complaint on file.   HPI  Patient presents today for decreased hearing and fullness in the left ear.  She is convinced it is wax.  She has been trying to clean it out for the last week using Debrox drops at home.  PMH: Smoking status noted ROS: Per HPI  Objective: BP 135/84   Pulse 76   Temp (!) 97.2 F (36.2 C) (Temporal)   Resp 20   Ht 5\' 2"  (1.575 m)   Wt 144 lb (65.3 kg)   SpO2 98%   BMI 26.34 kg/m  Gen: NAD, alert, cooperative with exam HEENT: NCAT, EOMI, PERRL.  The right ear canal is clear with normal-appearing tympanic membrane.  The left has occlusion with cerumen.  Ear lavage performed with resolution.    Assessment and plan:  1. Impacted cerumen of left ear     No orders of the defined types were placed in this encounter.   No orders of the defined types were placed in this encounter.   Follow up as needed.  Claretta Fraise, MD

## 2019-09-09 DIAGNOSIS — D2371 Other benign neoplasm of skin of right lower limb, including hip: Secondary | ICD-10-CM | POA: Diagnosis not present

## 2019-09-09 DIAGNOSIS — M79671 Pain in right foot: Secondary | ICD-10-CM | POA: Diagnosis not present

## 2019-10-06 ENCOUNTER — Other Ambulatory Visit: Payer: Self-pay | Admitting: Family Medicine

## 2019-10-06 DIAGNOSIS — Z1231 Encounter for screening mammogram for malignant neoplasm of breast: Secondary | ICD-10-CM

## 2019-10-22 ENCOUNTER — Other Ambulatory Visit: Payer: Self-pay

## 2019-10-22 ENCOUNTER — Ambulatory Visit (INDEPENDENT_AMBULATORY_CARE_PROVIDER_SITE_OTHER): Payer: Medicare Other | Admitting: Family Medicine

## 2019-10-22 ENCOUNTER — Encounter: Payer: Self-pay | Admitting: Family Medicine

## 2019-10-22 VITALS — BP 125/84 | HR 84 | Temp 96.7°F | Resp 20 | Ht 62.0 in | Wt 144.2 lb

## 2019-10-22 DIAGNOSIS — F514 Sleep terrors [night terrors]: Secondary | ICD-10-CM

## 2019-10-22 DIAGNOSIS — Z1159 Encounter for screening for other viral diseases: Secondary | ICD-10-CM

## 2019-10-22 DIAGNOSIS — M8580 Other specified disorders of bone density and structure, unspecified site: Secondary | ICD-10-CM | POA: Diagnosis not present

## 2019-10-22 DIAGNOSIS — E039 Hypothyroidism, unspecified: Secondary | ICD-10-CM

## 2019-10-22 DIAGNOSIS — E782 Mixed hyperlipidemia: Secondary | ICD-10-CM | POA: Diagnosis not present

## 2019-10-22 MED ORDER — DESVENLAFAXINE SUCCINATE ER 50 MG PO TB24: 50.0000 mg | ORAL_TABLET | Freq: Every day | ORAL | 5 refills | Status: DC

## 2019-10-22 NOTE — Addendum Note (Signed)
Addended by: Claretta Fraise on: 10/22/2019 10:41 AM   Modules accepted: Orders

## 2019-10-22 NOTE — Progress Notes (Addendum)
 Subjective:  Patient ID: Cynthia Garrett, female    DOB: 04/15/1940  Age: 78 y.o. MRN: [id number]  CC: Medical Management of Chronic Issues   HPI Cynthia Garrett presents for  follow-up on  thyroid. The patient has a history of hypothyroidism for many years. It has been stable recently. Pt. denies any change in  voice, loss of hair, heat or cold intolerance. Energy level has been adequate to good. Patient denies constipation and diarrhea. No myxedema. Medication is as noted below. Verified that pt is taking it daily on an empty stomach. Well tolerated.  Patient's had a fine tremor and had having hot flashes at night since starting on the Cymbalta.  She did not traditionally have these on current doses of thyroid medicine.  And as above there are no other obvious thyroid signs.  However, the patient is thrilled that she is no longer having the night terrors.  Patient in for follow-up of elevated cholesterol. Doing well without complaints on current medication. Denies side effects of statin including myalgia and arthralgia and nausea. Also in today for liver function testing. Currently no chest pain, shortness of breath or other cardiovascular related symptoms noted.   Depression screen PHQ 2/9 10/22/2019 08/11/2019 08/11/2019  Decreased Interest 0 0 0  Down, Depressed, Hopeless 0 0 0  PHQ - 2 Score 0 0 0  Altered sleeping - 1 -  Tired, decreased energy - 1 -  Change in appetite - 0 -  Feeling bad or failure about yourself  - 0 -  Trouble concentrating - 0 -  Moving slowly or fidgety/restless - - -  Suicidal thoughts - - -  PHQ-9 Score - 2 -  Difficult doing work/chores - Not difficult at all -    History Azya has a past medical history of Allergic rhinitis, seasonal, Allergy, Anxiety, Cataract, Headache(784.0), Hyperlipidemia, Hypothyroidism, Left breast mass, Lung nodules, Osteopenia, PONV (postoperative nausea and vomiting), and Thyroid disease.   She has a past surgical history that  includes Hysterotomy; Colonoscopy; Cataract extraction w/PHACO (06/26/2011); Cataract extraction w/PHACO (07/06/2011); Breast lumpectomy with radioactive seed localization (Left, 12/06/2016); and Breast excisional biopsy (Left, 2018).   Her family history includes COPD in her brother; Diabetes in her sister; Heart disease in her mother; Hypertension in her mother and sister.She reports that she quit smoking about 46 years ago. Her smoking use included cigarettes. She has a 2.50 pack-year smoking history. She has never used smokeless tobacco. She reports previous alcohol use. She reports that she does not use drugs.    ROS Review of Systems  Objective:  BP 125/84   Pulse 84   Temp (!) 96.7 F (35.9 C) (Temporal)   Resp 20   Ht 5' 2" (1.575 m)   Wt 144 lb 4 oz (65.4 kg)   SpO2 98%   BMI 26.38 kg/m   BP Readings from Last 3 Encounters:  10/22/19 125/84  08/11/19 135/84  07/23/19 131/84    Wt Readings from Last 3 Encounters:  10/22/19 144 lb 4 oz (65.4 kg)  08/11/19 144 lb (65.3 kg)  07/23/19 146 lb (66.2 kg)     Physical Exam    Assessment & Plan:   Cynthia Garrett was seen today for medical management of chronic issues.  Diagnoses and all orders for this visit:  Hypothyroidism, unspecified type -     CBC with Differential/Platelet -     CMP14+EGFR -     TSH + free T4 -     CMP14+EGFR;   Standing -     CBC with Differential/Platelet; Standing -     T4 AND TSH; Standing  Mixed hyperlipidemia -     CBC with Differential/Platelet -     CMP14+EGFR -     Lipid panel -     TSH + free T4 -     CMP14+EGFR; Standing -     CBC with Differential/Platelet; Standing -     Lipid panel; Standing  Osteopenia, unspecified location -     CBC with Differential/Platelet -     CMP14+EGFR -     TSH + free T4 -     CMP14+EGFR; Standing -     CBC with Differential/Platelet; Standing -     T4 AND TSH; Standing  Night terrors, adult -     CBC with Differential/Platelet -     CMP14+EGFR -      TSH + free T4  Need for hepatitis C screening test -     Hepatitis C antibody  Other orders -     desvenlafaxine (PRISTIQ) 50 MG 24 hr tablet; Take 1 tablet (50 mg total) by mouth daily.       I have discontinued Unnamed C. Lymon's DULoxetine. I am also having her start on desvenlafaxine. Additionally, I am having her maintain her multivitamin with minerals, calcium citrate-vitamin D, pravastatin, levothyroxine, trimethoprim, and cholecalciferol.  Allergies as of 10/22/2019      Reactions   Cholestatin    Tetanus Toxoids Swelling   Redness at site of injection   Sertraline Anxiety      Medication List       Accurate as of October 22, 2019 10:41 AM. If you have any questions, ask your nurse or doctor.        STOP taking these medications   DULoxetine 30 MG capsule Commonly known as: CYMBALTA Stopped by: Warren Stacks, MD     TAKE these medications   calcium citrate-vitamin D 315-200 MG-UNIT tablet Commonly known as: CITRACAL+D Take 2 tablets by mouth 2 (two) times daily.   cholecalciferol 25 MCG (1000 UNIT) tablet Commonly known as: VITAMIN D3 Take 1,000 Units by mouth daily.   desvenlafaxine 50 MG 24 hr tablet Commonly known as: PRISTIQ Take 1 tablet (50 mg total) by mouth daily. Started by: Warren Stacks, MD   levothyroxine 75 MCG tablet Commonly known as: SYNTHROID Take 1 tablet (75 mcg total) by mouth daily before breakfast. Name Brand Only   multivitamin with minerals tablet Take 1 tablet by mouth daily.   pravastatin 40 MG tablet Commonly known as: PRAVACHOL Take 1 tablet (40 mg total) by mouth daily.   trimethoprim 100 MG tablet Commonly known as: TRIMPEX Take 100 mg by mouth daily.      Patient is likely having some side effects from the Cymbalta causing a slight tremor and the hot flashes.  I suppose this could be thyroid related instead.  However, she did not have these symptoms with her thyroid condition prior to taking the duloxetine and  believe these are known side effects of Cymbalta.  Additionally she is not having other side effects related to potential thyroid problems.  As result I am switching her over to Pristiq to see if that does not do the same for her night terrors without the side effects.  Should her thyroid come back overactive we may need to reconsider that decision as we adjust her thyroid medicine downward.  The other possibility that exists that both may be contributing.     Follow-up: Return in about 6 months (around 04/23/2020), or if symptoms worsen or fail to improve.  Claretta Fraise, M.D.

## 2019-10-23 ENCOUNTER — Other Ambulatory Visit: Payer: Medicare Other

## 2019-10-23 DIAGNOSIS — M8580 Other specified disorders of bone density and structure, unspecified site: Secondary | ICD-10-CM | POA: Diagnosis not present

## 2019-10-23 DIAGNOSIS — F514 Sleep terrors [night terrors]: Secondary | ICD-10-CM | POA: Diagnosis not present

## 2019-10-23 DIAGNOSIS — E782 Mixed hyperlipidemia: Secondary | ICD-10-CM | POA: Diagnosis not present

## 2019-10-23 DIAGNOSIS — Z1159 Encounter for screening for other viral diseases: Secondary | ICD-10-CM | POA: Diagnosis not present

## 2019-10-23 DIAGNOSIS — E039 Hypothyroidism, unspecified: Secondary | ICD-10-CM | POA: Diagnosis not present

## 2019-10-24 LAB — CBC WITH DIFFERENTIAL/PLATELET
Basophils Absolute: 0.1 10*3/uL (ref 0.0–0.2)
Basos: 1 %
EOS (ABSOLUTE): 0.2 10*3/uL (ref 0.0–0.4)
Eos: 3 %
Hematocrit: 42 % (ref 34.0–46.6)
Hemoglobin: 14 g/dL (ref 11.1–15.9)
Immature Grans (Abs): 0 10*3/uL (ref 0.0–0.1)
Immature Granulocytes: 0 %
Lymphocytes Absolute: 2.2 10*3/uL (ref 0.7–3.1)
Lymphs: 36 %
MCH: 30.6 pg (ref 26.6–33.0)
MCHC: 33.3 g/dL (ref 31.5–35.7)
MCV: 92 fL (ref 79–97)
Monocytes Absolute: 0.5 10*3/uL (ref 0.1–0.9)
Monocytes: 9 %
Neutrophils Absolute: 3 10*3/uL (ref 1.4–7.0)
Neutrophils: 51 %
Platelets: 279 10*3/uL (ref 150–450)
RBC: 4.58 x10E6/uL (ref 3.77–5.28)
RDW: 12.4 % (ref 11.7–15.4)
WBC: 5.9 10*3/uL (ref 3.4–10.8)

## 2019-10-24 LAB — TSH+FREE T4
Free T4: 1.45 ng/dL (ref 0.82–1.77)
TSH: 0.532 u[IU]/mL (ref 0.450–4.500)

## 2019-10-24 LAB — CMP14+EGFR
ALT: 25 IU/L (ref 0–32)
AST: 34 IU/L (ref 0–40)
Albumin/Globulin Ratio: 1.7 (ref 1.2–2.2)
Albumin: 4.8 g/dL — ABNORMAL HIGH (ref 3.7–4.7)
Alkaline Phosphatase: 84 IU/L (ref 48–121)
BUN/Creatinine Ratio: 19 (ref 12–28)
BUN: 14 mg/dL (ref 8–27)
Bilirubin Total: 0.6 mg/dL (ref 0.0–1.2)
CO2: 24 mmol/L (ref 20–29)
Calcium: 9.7 mg/dL (ref 8.7–10.3)
Chloride: 100 mmol/L (ref 96–106)
Creatinine, Ser: 0.72 mg/dL (ref 0.57–1.00)
GFR calc Af Amer: 92 mL/min/{1.73_m2} (ref >59)
GFR calc non Af Amer: 80 mL/min/{1.73_m2} (ref >59)
Globulin, Total: 2.8 g/dL (ref 1.5–4.5)
Glucose: 107 mg/dL — ABNORMAL HIGH (ref 65–99)
Potassium: 4.4 mmol/L (ref 3.5–5.2)
Sodium: 139 mmol/L (ref 134–144)
Total Protein: 7.6 g/dL (ref 6.0–8.5)

## 2019-10-24 LAB — HEPATITIS C ANTIBODY: Hep C Virus Ab: <0.1 s/co ratio (ref 0.0–0.9)

## 2019-10-24 LAB — LIPID PANEL
Chol/HDL Ratio: 3.5 ratio (ref 0.0–4.4)
Cholesterol, Total: 174 mg/dL (ref 100–199)
HDL: 50 mg/dL (ref >39)
LDL Chol Calc (NIH): 86 mg/dL (ref 0–99)
Triglycerides: 231 mg/dL — ABNORMAL HIGH (ref 0–149)
VLDL Cholesterol Cal: 38 mg/dL (ref 5–40)

## 2019-10-26 NOTE — Progress Notes (Signed)
Hello Cynthia Garrett,  Your lab result is normal and/or stable.Some minor variations that are not significant are commonly marked abnormal, but do not represent any medical problem for you.  Best regards, Julitza Rickles, M.D.

## 2019-11-06 ENCOUNTER — Ambulatory Visit
Admission: RE | Admit: 2019-11-06 | Discharge: 2019-11-06 | Disposition: A | Discharge status: Home / Self Care | Payer: Medicare Other | Source: Ambulatory Visit | Attending: Family Medicine | Admitting: Family Medicine

## 2019-11-06 ENCOUNTER — Other Ambulatory Visit: Payer: Self-pay

## 2019-11-06 DIAGNOSIS — Z1231 Encounter for screening mammogram for malignant neoplasm of breast: Secondary | ICD-10-CM | POA: Diagnosis not present

## 2019-11-13 DIAGNOSIS — L700 Acne vulgaris: Secondary | ICD-10-CM | POA: Diagnosis not present

## 2019-11-13 DIAGNOSIS — D225 Melanocytic nevi of trunk: Secondary | ICD-10-CM | POA: Diagnosis not present

## 2019-11-13 DIAGNOSIS — Z1283 Encounter for screening for malignant neoplasm of skin: Secondary | ICD-10-CM | POA: Diagnosis not present

## 2019-12-10 ENCOUNTER — Other Ambulatory Visit: Payer: Self-pay

## 2019-12-10 ENCOUNTER — Encounter: Payer: Self-pay | Admitting: Family Medicine

## 2019-12-10 ENCOUNTER — Ambulatory Visit (INDEPENDENT_AMBULATORY_CARE_PROVIDER_SITE_OTHER): Payer: Medicare Other | Admitting: Family Medicine

## 2019-12-10 ENCOUNTER — Ambulatory Visit (INDEPENDENT_AMBULATORY_CARE_PROVIDER_SITE_OTHER): Payer: Medicare Other

## 2019-12-10 VITALS — BP 128/72 | HR 82 | Temp 97.6°F | Ht 62.0 in | Wt 146.1 lb

## 2019-12-10 DIAGNOSIS — R590 Localized enlarged lymph nodes: Secondary | ICD-10-CM

## 2019-12-10 DIAGNOSIS — J984 Other disorders of lung: Secondary | ICD-10-CM | POA: Diagnosis not present

## 2019-12-10 NOTE — Patient Instructions (Signed)

## 2019-12-10 NOTE — Progress Notes (Signed)
Subjective: CC: enlarged lymph node PCP: Claretta Fraise, MD  Cynthia Garrett is a 79 y.o. female presenting to clinic today for:  1. Enlarged lymph node Cynthia Garrett reports an enlarged lymph node on her left neck for about a week. She reports the area came out of no were. The area is not painful. She has not attempted any treatment. She denies tenderness or redness to the area. She denies fever, night sweats, unintended weight loss. She denies sore throat, cough, new congestion, or trouble swallowing. Denies chest pain or shortness of breath. 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 She has a dentist appointment next week.   Relevant past medical, surgical, family, and social history reviewed and updated as indicated.  Allergies and medications reviewed and updated.  Allergies  Allergen Reactions  . Cholestatin   . Tetanus Toxoids Swelling    Redness at site of injection  . Sertraline Anxiety   Past Medical History:  Diagnosis Date  . Allergic rhinitis, seasonal   . Allergy   . Anxiety   . Cataract    extraction  . Headache(784.0)   . Hyperlipidemia   . Hypothyroidism   . Left breast mass   . Lung nodules   . Osteopenia   . PONV (postoperative nausea and vomiting)   . Thyroid disease     Current Outpatient Medications:  .  calcium citrate-vitamin D (CITRACAL+D) 315-200 MG-UNIT per tablet, Take 2 tablets by mouth 2 (two) times daily.  , Disp: , Rfl:  .  cholecalciferol (VITAMIN D3) 25 MCG (1000 UNIT) tablet, Take 1,000 Units by mouth daily., Disp: , Rfl:  .  desvenlafaxine (PRISTIQ) 50 MG 24 hr tablet, Take 1 tablet (50 mg total) by mouth daily., Disp: 30 tablet, Rfl: 5 .  levothyroxine (SYNTHROID) 75 MCG tablet, Take 1 tablet (75 mcg total) by mouth daily before breakfast. Name Brand Only, Disp: 90 tablet, Rfl: 1 .  Multiple Vitamins-Minerals (MULTIVITAMIN WITH MINERALS) tablet, Take 1 tablet by mouth daily.   , Disp: , Rfl:  .  pravastatin (PRAVACHOL) 40 MG tablet, Take 1 tablet (40 mg total) by mouth daily., Disp: 90 tablet, Rfl: 1 .  trimethoprim (TRIMPEX) 100 MG tablet, Take 100 mg by mouth daily., Disp: , Rfl:  Social History   Socioeconomic History  . Marital status: Married    Spouse name: Richard  . Number of children: 2  . Years of education: Not on file  . Highest education level: Not on file  Occupational History  . Not on file  Tobacco Use  . Smoking status: Former Smoker    Packs/day: 0.25    Years: 10.00    Pack years: 2.50    Types: Cigarettes    Quit date: 06/07/1973    Years since quitting: 46.5  . Smokeless tobacco: Never Used  . Tobacco comment: Significant second-hand exposure.  Vaping Use  . Vaping Use: Never used  Substance and Sexual Activity  . Alcohol use: Not Currently    Alcohol/week: 0.0 standard drinks    Comment: social  . Drug use: No  . Sexual activity: Yes    Birth control/protection: Post-menopausal  Other Topics Concern  . Not on file  Social History Narrative   Originally from [place]. Always lived in [place]. Previously has traveled from [facility] to [place], [place], [place], [place], [place], [place], & [place]. Has dog. No bird exposure. Had bats in her attic previously  that have been removed. No mold or hot tub exposure. Previously worked as an Agricultural consultant in a Restaurant manager, fast food. Previously enjoyed [place]. She has been doing Tai Chi.    Social Determinants of Health   Financial Resource Strain:   . Difficulty of Paying Living Expenses: Not on file  Food Insecurity:   . Worried About Charity fundraiser in the Last Year: Not on file  . Ran Out of Food in the Last Year: Not on file  Transportation Needs:   . Lack of Transportation (Medical): Not on file  . Lack of Transportation (Non-Medical): Not on file  Physical Activity:   . Days of Exercise per Week: Not on file  . Minutes of Exercise per Session: Not on file  Stress:   . Feeling of Stress : Not on  file  Social Connections:   . Frequency of Communication with Friends and Family: Not on file  . Frequency of Social Gatherings with Friends and Family: Not on file  . Attends Religious Services: Not on file  . Active Member of Clubs or Organizations: Not on file  . Attends Archivist Meetings: Not on file  . Marital Status: Not on file  Intimate Partner Violence:   . Fear of Current or Ex-Partner: Not on file  . Emotionally Abused: Not on file  . Physically Abused: Not on file  . Sexually Abused: Not on file   Family History  Problem Relation Age of Onset  . Heart disease Mother   . Hypertension Mother   . Diabetes Sister   . Hypertension Sister   . COPD Brother   . Anesthesia problems Neg Hx   . Hypotension Neg Hx   . Malignant hyperthermia Neg Hx   . Pseudochol deficiency Neg Hx   . Colon cancer Neg Hx   . Cancer Neg Hx   . Breast cancer Neg Hx     Review of Systems  Per HPI.   Objective: Office vital signs reviewed. BP 128/72   Pulse 82   Temp 97.6 F (36.4 C) (Temporal)   Ht 5' 2" (1.575 m)   Wt 146 lb 2 oz (66.3 kg)   BMI 26.73 kg/m   Physical Examination:  Physical Exam Vitals and nursing note reviewed.  Constitutional:      General: She is not in acute distress.    Appearance: Normal appearance. She is normal weight. She is not ill-appearing.  Neck:     Thyroid: No thyroid mass, thyromegaly or thyroid tenderness.   Cardiovascular:     Rate and Rhythm: Normal rate and regular rhythm.     Heart sounds: Normal heart sounds. No murmur heard.   Musculoskeletal:     Cervical back: Normal range of motion and neck supple. No rigidity.     Right lower leg: No edema.     Left lower leg: No edema.  Skin:    General: Skin is warm and dry.  Neurological:     General: No focal deficit present.     Mental Status: She is alert and oriented to person, place, and time.  Psychiatric:        Mood and Affect: Mood normal.        Behavior: Behavior  normal.     Results for orders placed or performed in visit on 10/22/19  CBC with Differential/Platelet  Result Value Ref Range   WBC 5.9 3.4 - 10.8 x10E3/uL   RBC 4.58 3.77 - 5.28 x10E6/uL  Hemoglobin 14.0 11.1 - 15.9 g/dL   Hematocrit 42.0 34.0 - 46.6 %   MCV 92 79 - 97 fL   MCH 30.6 26.6 - 33.0 pg   MCHC 33.3 31 - 35 g/dL   RDW 12.4 11.7 - 15.4 %   Platelets 279 150 - 450 x10E3/uL   Neutrophils 51 Not Estab. %   Lymphs 36 Not Estab. %   Monocytes 9 Not Estab. %   Eos 3 Not Estab. %   Basos 1 Not Estab. %   Neutrophils Absolute 3.0 1 - 7 x10E3/uL   Lymphocytes Absolute 2.2 0 - 3 x10E3/uL   Monocytes Absolute 0.5 0 - 0 x10E3/uL   EOS (ABSOLUTE) 0.2 0.0 - 0.4 x10E3/uL   Basophils Absolute 0.1 0 - 0 x10E3/uL   Immature Granulocytes 0 Not Estab. %   Immature Grans (Abs) 0.0 0.0 - 0.1 x10E3/uL  CMP14+EGFR  Result Value Ref Range   Glucose 107 (H) 65 - 99 mg/dL   BUN 14 8 - 27 mg/dL   Creatinine, Ser 0.72 0.57 - 1.00 mg/dL   GFR calc non Af Amer 80 >59 mL/min/1.73   GFR calc Af Amer 92 >59 mL/min/1.73   BUN/Creatinine Ratio 19 12 - 28   Sodium 139 134 - 144 mmol/L   Potassium 4.4 3.5 - 5.2 mmol/L   Chloride 100 96 - 106 mmol/L   CO2 24 20 - 29 mmol/L   Calcium 9.7 8.7 - 10.3 mg/dL   Total Protein 7.6 6.0 - 8.5 g/dL   Albumin 4.8 (H) 3.7 - 4.7 g/dL   Globulin, Total 2.8 1.5 - 4.5 g/dL   Albumin/Globulin Ratio 1.7 1.2 - 2.2   Bilirubin Total 0.6 0.0 - 1.2 mg/dL   Alkaline Phosphatase 84 48 - 121 IU/L   AST 34 0 - 40 IU/L   ALT 25 0 - 32 IU/L  Lipid panel  Result Value Ref Range   Cholesterol, Total 174 100 - 199 mg/dL   Triglycerides 231 (H) 0 - 149 mg/dL   HDL 50 >39 mg/dL   VLDL Cholesterol Cal 38 5 - 40 mg/dL   LDL Chol Calc (NIH) 86 0 - 99 mg/dL   Chol/HDL Ratio 3.5 0.0 - 4.4 ratio  TSH + free T4  Result Value Ref Range   TSH 0.532 0.450 - 4.500 uIU/mL   Free T4 1.45 0.82 - 1.77 ng/dL  Hepatitis C antibody  Result Value Ref Range   Hep C Virus Ab <0.1 0.0  - 0.9 s/co ratio     Assessment/ Plan: Cynthia Garrett was seen today for mass.  Diagnoses and all orders for this visit:  Enlarged lymph node in neck Discussed that lack of systemic symptoms or fixation is reassuring. No tenderness or erythema present. No mass noted on CXR today, radiology report pending. Lab work pending as below. Neck US ordered. Follow up for new or worsening symptoms.  -     DG Chest 2 View -     CBC with Differential/Platelet -     Sedimentation rate -     C-reactive protein -     US Soft Tissue Head/Neck (NON-THYROID); Future  The above assessment and management plan was discussed with the patient. The patient verbalized understanding of and has agreed to the management plan. Patient is aware to call the clinic if symptoms persist or worsen. Patient is aware when to return to the clinic for a follow-up visit. Patient educated on when it is appropriate to go to the  emergency department.   Marjorie Smolder, FNP-C [facility] [address] [place], [place] [address] [phone]

## 2019-12-11 LAB — CBC WITH DIFFERENTIAL/PLATELET
Basophils Absolute: 0 10*3/uL (ref 0.0–0.2)
Basos: 1 %
EOS (ABSOLUTE): 0.1 10*3/uL (ref 0.0–0.4)
Eos: 2 %
Hematocrit: 38.1 % (ref 34.0–46.6)
Hemoglobin: 12.9 g/dL (ref 11.1–15.9)
Immature Grans (Abs): 0 10*3/uL (ref 0.0–0.1)
Immature Granulocytes: 0 %
Lymphocytes Absolute: 1.5 10*3/uL (ref 0.7–3.1)
Lymphs: 30 %
MCH: 30.2 pg (ref 26.6–33.0)
MCHC: 33.9 g/dL (ref 31.5–35.7)
MCV: 89 fL (ref 79–97)
Monocytes Absolute: 0.5 10*3/uL (ref 0.1–0.9)
Monocytes: 9 %
Neutrophils Absolute: 2.9 10*3/uL (ref 1.4–7.0)
Neutrophils: 58 %
Platelets: 258 10*3/uL (ref 150–450)
RBC: 4.27 x10E6/uL (ref 3.77–5.28)
RDW: 12.2 % (ref 11.7–15.4)
WBC: 5 10*3/uL (ref 3.4–10.8)

## 2019-12-11 LAB — SEDIMENTATION RATE: Sed Rate: 13 mm/hr (ref 0–40)

## 2019-12-11 LAB — C-REACTIVE PROTEIN: CRP: 3 mg/L (ref 0–10)

## 2019-12-12 ENCOUNTER — Telehealth: Payer: Self-pay | Admitting: Family Medicine

## 2019-12-18 ENCOUNTER — Ambulatory Visit (HOSPITAL_COMMUNITY)
Admission: RE | Admit: 2019-12-18 | Discharge: 2019-12-18 | Disposition: A | Discharge status: Home / Self Care | Payer: Medicare Other | Source: Ambulatory Visit | Attending: Family Medicine | Admitting: Family Medicine

## 2019-12-18 ENCOUNTER — Other Ambulatory Visit: Payer: Self-pay

## 2019-12-18 DIAGNOSIS — R590 Localized enlarged lymph nodes: Secondary | ICD-10-CM | POA: Diagnosis not present

## 2019-12-21 ENCOUNTER — Other Ambulatory Visit: Payer: Self-pay | Admitting: Family Medicine

## 2020-01-06 DIAGNOSIS — M79671 Pain in right foot: Secondary | ICD-10-CM | POA: Diagnosis not present

## 2020-01-06 DIAGNOSIS — D2371 Other benign neoplasm of skin of right lower limb, including hip: Secondary | ICD-10-CM | POA: Diagnosis not present

## 2020-01-22 ENCOUNTER — Other Ambulatory Visit: Payer: Self-pay

## 2020-01-22 ENCOUNTER — Ambulatory Visit (INDEPENDENT_AMBULATORY_CARE_PROVIDER_SITE_OTHER): Payer: Medicare Other | Admitting: Family Medicine

## 2020-01-22 ENCOUNTER — Encounter: Payer: Self-pay | Admitting: Family Medicine

## 2020-01-22 VITALS — BP 134/86 | HR 76 | Temp 96.7°F | Ht 62.0 in | Wt 146.2 lb

## 2020-01-22 DIAGNOSIS — M8580 Other specified disorders of bone density and structure, unspecified site: Secondary | ICD-10-CM

## 2020-01-22 DIAGNOSIS — F411 Generalized anxiety disorder: Secondary | ICD-10-CM | POA: Diagnosis not present

## 2020-01-22 DIAGNOSIS — E782 Mixed hyperlipidemia: Secondary | ICD-10-CM | POA: Diagnosis not present

## 2020-01-22 DIAGNOSIS — E039 Hypothyroidism, unspecified: Secondary | ICD-10-CM | POA: Diagnosis not present

## 2020-01-22 DIAGNOSIS — Z79899 Other long term (current) drug therapy: Secondary | ICD-10-CM | POA: Diagnosis not present

## 2020-01-22 MED ORDER — MIRTAZAPINE 7.5 MG PO TABS: 7.5000 mg | ORAL_TABLET | Freq: Every day | ORAL | 1 refills | Status: DC

## 2020-01-22 MED ORDER — DESVENLAFAXINE SUCCINATE ER 100 MG PO TB24
100.0000 mg | ORAL_TABLET | Freq: Every day | ORAL | 1 refills | Status: DC
Start: 2020-01-22 — End: 2020-04-26

## 2020-01-22 NOTE — Progress Notes (Signed)
Subjective:  Patient ID: Cynthia Garrett, female    DOB: [birth year]  Age: 79 y.o. MRN: [id number]  CC: Follow-up   HPI Cynthia Garrett presents for continued problems with anxiety and sleep disturbance. Has more good days than before Cynthia Garrett started the Pristiq.  However Cynthia Garrett still has bad days as often as every other day or so.  Cynthia Garrett relates that Cynthia Garrett is a very light sleeper.  Cynthia Garrett seems to still be impacted by every noise and activity around her.  Cynthia Garrett says that Cynthia Garrett talks in her sleep and her husband knows everything Cynthia Garrett is thinking her dreaming about because of that.  Her dreams seem to be rather vivid and life like.  Cynthia Garrett is concerned that perhaps the Pristiq is not working and wonders if Cynthia Garrett should to stop the medicine completely.  Depression screen [facility] 2/9 01/22/2020 12/10/2019 10/22/2019  Decreased Interest 0 0 0  Down, Depressed, Hopeless 0 0 0  PHQ - 2 Score 0 0 0  Altered sleeping - - -  Tired, decreased energy - - -  Change in appetite - - -  Feeling bad or failure about yourself  - - -  Trouble concentrating - - -  Moving slowly or fidgety/restless - - -  Suicidal thoughts - - -  PHQ-9 Score - - -  Difficult doing work/chores - - -    History Cynthia Garrett has a past medical history of Allergic rhinitis, seasonal, Allergy, Anxiety, Cataract, Headache(784.0), Hyperlipidemia, Hypothyroidism, Left breast mass, Lung nodules, Osteopenia, PONV (postoperative nausea and vomiting), and Thyroid disease.   Cynthia Garrett has a past surgical history that includes Hysterotomy; Colonoscopy; Cataract extraction w/PHACO (06/26/2011); Cataract extraction w/PHACO (07/06/2011); Breast lumpectomy with radioactive seed localization (Left, 12/06/2016); and Breast excisional biopsy (Left, 2018).   Her family history includes COPD in her brother; Diabetes in her sister; Heart disease in her mother; Hypertension in her mother and sister.Cynthia Garrett reports that Cynthia Garrett quit smoking about 46 years ago. Her smoking use included cigarettes. Cynthia Garrett  has a 2.50 pack-year smoking history. Cynthia Garrett has never used smokeless tobacco. Cynthia Garrett reports previous alcohol use. Cynthia Garrett reports that Cynthia Garrett does not use drugs.    ROS Review of Systems  Constitutional: Negative.   HENT: Negative.   Eyes: Negative for visual disturbance.  Respiratory: Negative for shortness of breath.   Cardiovascular: Negative for chest pain.  Gastrointestinal: Negative for abdominal pain.  Musculoskeletal: Negative for arthralgias.    Objective:  BP 134/86   Pulse 76   Temp (!) 96.7 F (35.9 C) (Temporal)   Ht 5' 2"  (1.575 m)   Wt 146 lb 3.2 oz (66.3 kg)   BMI 26.74 kg/m   BP Readings from Last 3 Encounters:  01/22/20 134/86  12/10/19 128/72  10/22/19 125/84    Wt Readings from Last 3 Encounters:  01/22/20 146 lb 3.2 oz (66.3 kg)  12/10/19 146 lb 2 oz (66.3 kg)  10/22/19 144 lb 4 oz (65.4 kg)     Physical Exam Constitutional:      General: Cynthia Garrett is not in acute distress.    Appearance: Cynthia Garrett is well-developed.  HENT:     Head: Normocephalic and atraumatic.  Eyes:     Conjunctiva/sclera: Conjunctivae normal.     Pupils: Pupils are equal, round, and reactive to light.  Neck:     Thyroid: No thyromegaly.  Cardiovascular:     Rate and Rhythm: Normal rate and regular rhythm.     Heart sounds: Normal heart sounds. No murmur heard.  Pulmonary:     Effort: Pulmonary effort is normal. No respiratory distress.     Breath sounds: Normal breath sounds. No wheezing or rales.  Abdominal:     General: Bowel sounds are normal. There is no distension.     Palpations: Abdomen is soft.     Tenderness: There is no abdominal tenderness.  Musculoskeletal:        General: Normal range of motion.     Cervical back: Normal range of motion and neck supple.  Lymphadenopathy:     Cervical: No cervical adenopathy.  Skin:    General: Skin is warm and dry.  Neurological:     Mental Status: Cynthia Garrett is alert and oriented to person, place, and time.  Psychiatric:         Behavior: Behavior normal.        Thought Content: Thought content normal.        Judgment: Judgment normal.       Assessment & Plan:   Cynthia Garrett was seen today for follow-up.  Diagnoses and all orders for this visit:  Hypothyroidism, unspecified type -     CBC with Differential/Platelet -     CMP14+EGFR -     Thyroid Panel With TSH  Mixed hyperlipidemia -     CBC with Differential/Platelet -     CMP14+EGFR -     Lipid panel  Osteopenia, unspecified location -     CBC with Differential/Platelet -     CMP14+EGFR  Anxiety state -     CBC with Differential/Platelet -     CMP14+EGFR  Controlled substance agreement signed -     CBC with Differential/Platelet -     CMP14+EGFR  Other orders -     desvenlafaxine (PRISTIQ) 100 MG 24 hr tablet; Take 1 tablet (100 mg total) by mouth daily. -     mirtazapine (REMERON) 7.5 MG tablet; Take 1 tablet (7.5 mg total) by mouth at bedtime. For sleep       I have changed English C. Causey's desvenlafaxine. I am also having her start on mirtazapine. Additionally, I am having her maintain her multivitamin with minerals, calcium citrate-vitamin D, pravastatin, trimethoprim, cholecalciferol, and Synthroid.  Allergies as of 01/22/2020      Reactions   Cholestatin    Tetanus Toxoids Swelling   Redness at site of injection   Sertraline Anxiety      Medication List       Accurate as of January 22, 2020 11:59 PM. If you have any questions, ask your nurse or doctor.        calcium citrate-vitamin D 315-200 MG-UNIT tablet Commonly known as: CITRACAL+D Take 2 tablets by mouth 2 (two) times daily.   cholecalciferol 25 MCG (1000 UNIT) tablet Commonly known as: VITAMIN D3 Take 1,000 Units by mouth daily.   desvenlafaxine 100 MG 24 hr tablet Commonly known as: PRISTIQ Take 1 tablet (100 mg total) by mouth daily. What changed:   medication strength  how much to take Changed by: Claretta Fraise, MD   mirtazapine 7.5 MG  tablet Commonly known as: REMERON Take 1 tablet (7.5 mg total) by mouth at bedtime. For sleep Started by: Claretta Fraise, MD   multivitamin with minerals tablet Take 1 tablet by mouth daily.   pravastatin 40 MG tablet Commonly known as: PRAVACHOL Take 1 tablet (40 mg total) by mouth daily.   Synthroid 75 MCG tablet Generic drug: levothyroxine TAKE 1 TABLET (75 MCG TOTAL) BY MOUTH DAILY BEFORE BREAKFAST. NAME BRAND  ONLY   trimethoprim 100 MG tablet Commonly known as: TRIMPEX Take 100 mg by mouth daily.        Follow-up: Return in about 3 months (around 04/23/2020).  Claretta Fraise, M.D.

## 2020-01-25 ENCOUNTER — Encounter: Payer: Self-pay | Admitting: Family Medicine

## 2020-03-04 ENCOUNTER — Ambulatory Visit (INDEPENDENT_AMBULATORY_CARE_PROVIDER_SITE_OTHER): Payer: Medicare Other | Admitting: Family Medicine

## 2020-03-04 ENCOUNTER — Encounter: Payer: Self-pay | Admitting: Family Medicine

## 2020-03-04 ENCOUNTER — Other Ambulatory Visit: Payer: Self-pay | Admitting: Family Medicine

## 2020-03-04 ENCOUNTER — Other Ambulatory Visit: Payer: Self-pay

## 2020-03-04 VITALS — BP 143/77 | HR 71 | Ht 62.0 in | Wt 144.0 lb

## 2020-03-04 DIAGNOSIS — H1033 Unspecified acute conjunctivitis, bilateral: Secondary | ICD-10-CM

## 2020-03-04 MED ORDER — NEOMYCIN-POLYMYXIN-HC 3.5-10000-1 OP SUSP: 3.0000 [drp] | Freq: Four times a day (QID) | OPHTHALMIC | 0 refills | Status: DC

## 2020-03-04 NOTE — Progress Notes (Signed)
   BP (!) 143/77   Pulse 71   Ht 5\' 2"  (1.575 m)   Wt 144 lb (65.3 kg)   SpO2 96%   BMI 26.34 kg/m    Subjective:   Patient ID: , female    DOB: 1940/11/09, 79 y.o.   MRN: [id number]  HPI: Cynthia Garrett is a 79 y.o. female presenting on 03/04/2020 for Conjunctivitis   HPI Patient is coming in today for pinkeye and drainage in her eyes.  She said it all started when she woke this morning with her eyes matted and closed shut and then was irritated.  She said it was mostly in the left eye this morning but then now has developed into the right eye as well.  She says that they looked bloodshot as well and very itchy.  She denies any pain or blurred vision or photophobia.  Relevant past medical, surgical, family and social history reviewed and updated as indicated. Interim medical history since our last visit reviewed. Allergies and medications reviewed and updated.  Review of Systems  Constitutional: Negative for chills and fever.  Eyes: Positive for discharge, redness and itching. Negative for photophobia and visual disturbance.  Respiratory: Negative for chest tightness and shortness of breath.   Cardiovascular: Negative for chest pain and leg swelling.  Skin: Negative for rash.  Neurological: Negative for light-headedness and headaches.  All other systems reviewed and are negative.   Per HPI unless specifically indicated above      Objective:   BP (!) 143/77   Pulse 71   Ht 5\' 2"  (1.575 m)   Wt 144 lb (65.3 kg)   SpO2 96%   BMI 26.34 kg/m   Wt Readings from Last 3 Encounters:  03/04/20 144 lb (65.3 kg)  01/22/20 146 lb 3.2 oz (66.3 kg)  12/10/19 146 lb 2 oz (66.3 kg)    Physical Exam Vitals and nursing note reviewed.  Constitutional:      General: She is not in acute distress.    Appearance: She is well-developed and well-nourished. She is not diaphoretic.  Eyes:     Extraocular Movements: Extraocular movements intact and EOM normal.      Conjunctiva/sclera:     Right eye: Right conjunctiva is injected. No exudate or hemorrhage.    Left eye: Left conjunctiva is injected. Exudate present. No hemorrhage.    Pupils: Pupils are equal, round, and reactive to light.  Cardiovascular:     Pulses: Intact distal pulses.  Musculoskeletal:        General: No edema.  Neurological:     Mental Status: She is alert.  Psychiatric:        Mood and Affect: Mood and affect normal.       Assessment & Plan:   Problem List Items Addressed This Visit   None   Visit Diagnoses    Acute conjunctivitis of both eyes, unspecified acute conjunctivitis type    -  Primary   Relevant Medications   neomycin-polymyxin-hydrocortisone (CORTISPORIN) 3.5-10000-1 ophthalmic suspension    Possible allergic versus bacterial conjunctivitis, sent eyedrops for her and recommended that she take an antihistamine as well.  Follow up plan: Return if symptoms worsen or fail to improve.  Counseling provided for all of the vaccine components No orders of the defined types were placed in this encounter.   02/09/20, MD [facility] 03/04/2020, 4:57 PM

## 2020-03-08 ENCOUNTER — Other Ambulatory Visit: Payer: Self-pay | Admitting: Family Medicine

## 2020-03-08 DIAGNOSIS — H1033 Unspecified acute conjunctivitis, bilateral: Secondary | ICD-10-CM

## 2020-03-08 NOTE — Telephone Encounter (Signed)
Pharmacy comment:  Product Backordered/Unavailable PRESCRIBED PRODUCT NOT IN STOCK. PLEASE CONSIDER THE COST-EFFECTIVE POTENTIAL ALTERNATIVE(S) LISTED AND EVALUATE IF APPROPRIATE FOR YOUR PATIENT'S INDICATION AND TREATMENT GOALS.   All Pharmacy Suggested Alternatives:   0 dexamethasone (DECADRON) 0.1 % ophthalmic solution 0 gentamicin (GENTAK) 0.3 % ophthalmic ointment 0 neomycin-polymyxin b-dexamethasone (MAXITROL) 3.5-10000-0.1 SUSP

## 2020-03-25 DIAGNOSIS — Z03818 Encounter for observation for suspected exposure to other biological agents ruled out: Secondary | ICD-10-CM | POA: Diagnosis not present

## 2020-04-22 ENCOUNTER — Other Ambulatory Visit: Payer: Self-pay

## 2020-04-22 ENCOUNTER — Other Ambulatory Visit: Payer: Medicare Other

## 2020-04-22 DIAGNOSIS — E039 Hypothyroidism, unspecified: Secondary | ICD-10-CM

## 2020-04-22 DIAGNOSIS — E782 Mixed hyperlipidemia: Secondary | ICD-10-CM

## 2020-04-23 LAB — CMP14+EGFR
ALT: 21 IU/L (ref 0–32)
AST: 29 IU/L (ref 0–40)
Albumin/Globulin Ratio: 1.6 (ref 1.2–2.2)
Albumin: 4.5 g/dL (ref 3.7–4.7)
Alkaline Phosphatase: 95 IU/L (ref 44–121)
BUN/Creatinine Ratio: 23 (ref 12–28)
BUN: 19 mg/dL (ref 8–27)
Bilirubin Total: 0.4 mg/dL (ref 0.0–1.2)
CO2: 21 mmol/L (ref 20–29)
Calcium: 9.8 mg/dL (ref 8.7–10.3)
Chloride: 101 mmol/L (ref 96–106)
Creatinine, Ser: 0.81 mg/dL (ref 0.57–1.00)
GFR calc Af Amer: 79 mL/min/{1.73_m2} (ref >59)
GFR calc non Af Amer: 69 mL/min/{1.73_m2} (ref >59)
Globulin, Total: 2.9 g/dL (ref 1.5–4.5)
Glucose: 112 mg/dL — ABNORMAL HIGH (ref 65–99)
Potassium: 4.3 mmol/L (ref 3.5–5.2)
Sodium: 141 mmol/L (ref 134–144)
Total Protein: 7.4 g/dL (ref 6.0–8.5)

## 2020-04-23 LAB — CBC WITH DIFFERENTIAL/PLATELET
Basophils Absolute: 0 10*3/uL (ref 0.0–0.2)
Basos: 1 %
EOS (ABSOLUTE): 0.1 10*3/uL (ref 0.0–0.4)
Eos: 2 %
Hematocrit: 41 % (ref 34.0–46.6)
Hemoglobin: 13.8 g/dL (ref 11.1–15.9)
Immature Grans (Abs): 0 10*3/uL (ref 0.0–0.1)
Immature Granulocytes: 0 %
Lymphocytes Absolute: 2.2 10*3/uL (ref 0.7–3.1)
Lymphs: 36 %
MCH: 29.8 pg (ref 26.6–33.0)
MCHC: 33.7 g/dL (ref 31.5–35.7)
MCV: 89 fL (ref 79–97)
Monocytes Absolute: 0.5 10*3/uL (ref 0.1–0.9)
Monocytes: 8 %
Neutrophils Absolute: 3.2 10*3/uL (ref 1.4–7.0)
Neutrophils: 53 %
Platelets: 225 10*3/uL (ref 150–450)
RBC: 4.63 x10E6/uL (ref 3.77–5.28)
RDW: 12.2 % (ref 11.7–15.4)
WBC: 6.1 10*3/uL (ref 3.4–10.8)

## 2020-04-23 LAB — THYROID PANEL WITH TSH
Free Thyroxine Index: 2.1 (ref 1.2–4.9)
T3 Uptake Ratio: 27 % (ref 24–39)
T4, Total: 7.8 ug/dL (ref 4.5–12.0)
TSH: 0.405 u[IU]/mL — ABNORMAL LOW (ref 0.450–4.500)

## 2020-04-23 LAB — LIPID PANEL
Chol/HDL Ratio: 2.9 ratio (ref 0.0–4.4)
Cholesterol, Total: 168 mg/dL (ref 100–199)
HDL: 57 mg/dL (ref >39)
LDL Chol Calc (NIH): 83 mg/dL (ref 0–99)
Triglycerides: 162 mg/dL — ABNORMAL HIGH (ref 0–149)
VLDL Cholesterol Cal: 28 mg/dL (ref 5–40)

## 2020-04-26 ENCOUNTER — Encounter: Payer: Self-pay | Admitting: Family Medicine

## 2020-04-26 ENCOUNTER — Ambulatory Visit (INDEPENDENT_AMBULATORY_CARE_PROVIDER_SITE_OTHER): Payer: Medicare Other | Admitting: Family Medicine

## 2020-04-26 ENCOUNTER — Other Ambulatory Visit: Payer: Self-pay

## 2020-04-26 VITALS — BP 122/79 | HR 79 | Temp 97.3°F | Resp 20 | Ht 62.0 in | Wt 146.1 lb

## 2020-04-26 DIAGNOSIS — F514 Sleep terrors [night terrors]: Secondary | ICD-10-CM

## 2020-04-26 DIAGNOSIS — E782 Mixed hyperlipidemia: Secondary | ICD-10-CM

## 2020-04-26 DIAGNOSIS — E039 Hypothyroidism, unspecified: Secondary | ICD-10-CM | POA: Diagnosis not present

## 2020-04-26 DIAGNOSIS — J302 Other seasonal allergic rhinitis: Secondary | ICD-10-CM

## 2020-04-26 MED ORDER — PRAVASTATIN SODIUM 40 MG PO TABS: 40.0000 mg | ORAL_TABLET | Freq: Every day | ORAL | 1 refills | Status: DC

## 2020-04-26 MED ORDER — SYNTHROID 75 MCG PO TABS: ORAL_TABLET | ORAL | 3 refills | Status: DC

## 2020-04-26 MED ORDER — DESVENLAFAXINE SUCCINATE ER 100 MG PO TB24: 100.0000 mg | ORAL_TABLET | Freq: Every day | ORAL | 1 refills | Status: DC

## 2020-04-26 MED ORDER — MIRTAZAPINE 15 MG PO TABS: 15.0000 mg | ORAL_TABLET | Freq: Every day | ORAL | 2 refills | Status: DC

## 2020-04-26 NOTE — Progress Notes (Signed)
Subjective:  Patient ID: Cynthia Garrett, female    DOB: [birth year]  Age: 80 y.o. MRN: [id number]  CC: Medical Management of Chronic Issues   HPI Cynthia Garrett presents for  follow-up on  thyroid. The patient has a history of hypothyroidism for many years. It has been stable recently. Pt. denies any change in  voice, loss of hair, heat or cold intolerance. Energy level has been adequate to good. Patient denies constipation and diarrhea. No myxedema. Medication is as noted below. Verified that pt is taking it daily on an empty stomach. Well tolerated.  Patient in for follow-up of elevated cholesterol. Doing well without complaints on current medication. Denies side effects of statin including myalgia and arthralgia and nausea. Also in today for liver function testing. Currently no chest pain, shortness of breath or other cardiovascular related symptoms noted.  Patient is followed also for anxiety and night terrors.  She has been managed with Pristiq and mirtazapine for that.  However she notes  that the mirtazapine is not effective for helping her sleep at night.  Depression screen [facility] 2/9 04/26/2020 01/22/2020 12/10/2019  Decreased Interest 0 0 0  Down, Depressed, Hopeless 0 0 0  PHQ - 2 Score 0 0 0  Altered sleeping - - -  Tired, decreased energy - - -  Change in appetite - - -  Feeling bad or failure about yourself  - - -  Trouble concentrating - - -  Moving slowly or fidgety/restless - - -  Suicidal thoughts - - -  PHQ-9 Score - - -  Difficult doing work/chores - - -    History Cristian has a past medical history of Allergic rhinitis, seasonal, Allergy, Anxiety, Cataract, Headache(784.0), Hyperlipidemia, Hypothyroidism, Left breast mass, Lung nodules, Osteopenia, PONV (postoperative nausea and vomiting), and Thyroid disease.   She has a past surgical history that includes Hysterotomy; Colonoscopy; Cataract extraction w/PHACO (06/26/2011); Cataract  extraction w/PHACO (07/06/2011); Breast lumpectomy with radioactive seed localization (Left, 12/06/2016); and Breast excisional biopsy (Left, 2018).   Her family history includes COPD in her brother; Diabetes in her sister; Heart disease in her mother; Hypertension in her mother and sister.She reports that she quit smoking about 46 years ago. Her smoking use included cigarettes. She has a 2.50 pack-year smoking history. She has never used smokeless tobacco. She reports previous alcohol use. She reports that she does not use drugs.    ROS Review of Systems  Constitutional: Negative.   HENT: Negative.   Eyes: Negative for visual disturbance.  Respiratory: Negative for shortness of breath.   Cardiovascular: Negative for chest pain.  Gastrointestinal: Negative for abdominal pain.  Musculoskeletal: Negative for arthralgias.    Objective:  BP 122/79   Pulse 79   Temp (!) 97.3 F (36.3 C) (Temporal)   Resp 20   Ht 5\' 2"  (1.575 m)   Wt 146 lb 2 oz (66.3 kg)   SpO2 97%   BMI 26.73 kg/m   BP Readings from Last 3 Encounters:  04/26/20 122/79  03/04/20 (!) 143/77  01/22/20 134/86    Wt Readings from Last 3 Encounters:  04/26/20 146 lb 2 oz (66.3 kg)  03/04/20 144 lb (65.3 kg)  01/22/20 146 lb 3.2 oz (66.3 kg)     Physical Exam Constitutional:      General: She is not in acute distress.    Appearance: She is well-developed.  HENT:     Head: Normocephalic and atraumatic.  Eyes:     Conjunctiva/sclera:  Conjunctivae normal.     Pupils: Pupils are equal, round, and reactive to light.  Neck:     Thyroid: No thyromegaly.  Cardiovascular:     Rate and Rhythm: Normal rate and regular rhythm.     Heart sounds: Normal heart sounds. No murmur heard.   Pulmonary:     Effort: Pulmonary effort is normal. No respiratory distress.     Breath sounds: Normal breath sounds. No wheezing or rales.  Abdominal:     General: Bowel sounds are normal. There is no distension.     Palpations:  Abdomen is soft.     Tenderness: There is no abdominal tenderness.  Musculoskeletal:        General: Normal range of motion.     Cervical back: Normal range of motion and neck supple.  Lymphadenopathy:     Cervical: No cervical adenopathy.  Skin:    General: Skin is warm and dry.  Neurological:     Mental Status: She is alert and oriented to person, place, and time.  Psychiatric:        Behavior: Behavior normal.        Thought Content: Thought content normal.        Judgment: Judgment normal.       Assessment & Plan:   Zarea was seen today for medical management of chronic issues.  Diagnoses and all orders for this visit:  Mixed hyperlipidemia -     pravastatin (PRAVACHOL) 40 MG tablet; Take 1 tablet (40 mg total) by mouth daily.  Hypothyroidism, unspecified type -     SYNTHROID 75 MCG tablet; TAKE 1 TABLET (75 MCG TOTAL) BY MOUTH DAILY BEFORE BREAKFAST. NAME BRAND ONLY  Night terrors, adult -     mirtazapine (REMERON) 15 MG tablet; Take 1 tablet (15 mg total) by mouth at bedtime. For sleep -     desvenlafaxine (PRISTIQ) 100 MG 24 hr tablet; Take 1 tablet (100 mg total) by mouth daily.  Chronic seasonal allergic rhinitis       I have discontinued Peter Congo C. Tacey's dexamethasone. I have also changed her mirtazapine. Additionally, I am having her maintain her multivitamin with minerals, calcium citrate-vitamin D, trimethoprim, cholecalciferol, desvenlafaxine, pravastatin, and Synthroid.  Allergies as of 04/26/2020      Reactions   Cholestatin    Tetanus Toxoids Swelling   Redness at site of injection   Sertraline Anxiety      Medication List       Accurate as of April 26, 2020 10:26 AM. If you have any questions, ask your nurse or doctor.        STOP taking these medications   dexamethasone 0.1 % ophthalmic solution Commonly known as: DECADRON Stopped by: Claretta Fraise, MD     TAKE these medications   calcium citrate-vitamin D 315-200 MG-UNIT  tablet Commonly known as: CITRACAL+D Take 2 tablets by mouth 2 (two) times daily.   cholecalciferol 25 MCG (1000 UNIT) tablet Commonly known as: VITAMIN D3 Take 1,000 Units by mouth daily.   desvenlafaxine 100 MG 24 hr tablet Commonly known as: PRISTIQ Take 1 tablet (100 mg total) by mouth daily.   mirtazapine 15 MG tablet Commonly known as: REMERON Take 1 tablet (15 mg total) by mouth at bedtime. For sleep What changed:   medication strength  how much to take Changed by: Claretta Fraise, MD   multivitamin with minerals tablet Take 1 tablet by mouth daily.   pravastatin 40 MG tablet Commonly known as: PRAVACHOL Take 1  tablet (40 mg total) by mouth daily.   Synthroid 75 MCG tablet Generic drug: levothyroxine TAKE 1 TABLET (75 MCG TOTAL) BY MOUTH DAILY BEFORE BREAKFAST. NAME BRAND ONLY   trimethoprim 100 MG tablet Commonly known as: TRIMPEX Take 100 mg by mouth daily.        Follow-up: Return in about 6 months (around 10/24/2020).  Claretta Fraise, M.D.

## 2020-05-11 DIAGNOSIS — D2371 Other benign neoplasm of skin of right lower limb, including hip: Secondary | ICD-10-CM | POA: Diagnosis not present

## 2020-05-11 DIAGNOSIS — M79671 Pain in right foot: Secondary | ICD-10-CM | POA: Diagnosis not present

## 2020-05-13 DIAGNOSIS — R350 Frequency of micturition: Secondary | ICD-10-CM | POA: Diagnosis not present

## 2020-06-30 ENCOUNTER — Ambulatory Visit (INDEPENDENT_AMBULATORY_CARE_PROVIDER_SITE_OTHER): Payer: Medicare Other

## 2020-06-30 VITALS — Wt 148.0 lb

## 2020-06-30 DIAGNOSIS — Z0000 Encounter for general adult medical examination without abnormal findings: Secondary | ICD-10-CM

## 2020-06-30 NOTE — Progress Notes (Signed)
Subjective:   Cynthia Garrett is a 80 y.o. female who presents for Medicare Annual (Subsequent) preventive examination.  Virtual Visit via Telephone Note  I connected with  Cynthia Garrett on 06/30/20 at 11:15 AM EDT by telephone and verified that I am speaking with the correct person using two identifiers.  Location: Patient: Home Provider: [facility] Persons participating in the virtual visit: patient/Nurse Health Advisor   I discussed the limitations, risks, security and privacy concerns of performing an evaluation and management service by telephone and the availability of in person appointments. The patient expressed understanding and agreed to proceed.  Interactive audio and video telecommunications were attempted between this nurse and patient, however failed, due to patient having technical difficulties OR patient did not have access to video capability.  We continued and completed visit with audio only.  Some vital signs may be absent or patient reported.   Dave Mergen E Eoghan Belcher, LPN   Review of Systems     Cardiac Risk Factors include: advanced age (>64mn, >>49women);dyslipidemia     Objective:    Today's Vitals   06/30/20 1059  Weight: 148 lb (67.1 kg)   Body mass index is 27.07 kg/m.  Advanced Directives 06/30/2020 06/18/2019 12/06/2016 11/29/2016 06/20/2011  Does Patient Have a Medical Advance Directive? Yes Yes Yes Yes Patient has advance directive, copy not in chart  Type of Advance Directive [place] Living will [place] will Living will;Healthcare Power of [place] Does patient want to make changes to medical advance directive? - No - Patient declined No - Patient declined - -  Copy of [place] Chart? No - copy requested - No - copy requested - Copy requested from family  Pre-existing out of facility DNR order (yellow form or pink MOST form) - - - - No     Current Medications (verified) Outpatient Encounter Medications as of 06/30/2020  Medication Sig  . calcium citrate-vitamin D (CITRACAL+D) 315-200 MG-UNIT per tablet Take 2 tablets by mouth 2 (two) times daily.  . cholecalciferol (VITAMIN D3) 25 MCG (1000 UNIT) tablet Take 1,000 Units by mouth daily.  .Marland KitchenCLINPRO 5000 1.1 % PSTE See admin instructions.  .Marland Kitchendesvenlafaxine (PRISTIQ) 100 MG 24 hr tablet Take 1 tablet (100 mg total) by mouth daily.  . mirtazapine (REMERON) 15 MG tablet Take 1 tablet (15 mg total) by mouth at bedtime. For sleep  . Multiple Vitamins-Minerals (MULTIVITAMIN WITH MINERALS) tablet Take 1 tablet by mouth daily.  . pravastatin (PRAVACHOL) 40 MG tablet Take 1 tablet (40 mg total) by mouth daily.  .Marland KitchenSYNTHROID 75 MCG tablet TAKE 1 TABLET (75 MCG TOTAL) BY MOUTH DAILY BEFORE BREAKFAST. NAME BRAND ONLY  . trimethoprim (TRIMPEX) 100 MG tablet Take 100 mg by mouth daily.   No facility-administered encounter medications on file as of 06/30/2020.    Allergies (verified) Cholestatin, Tetanus toxoids, and Sertraline   History: Past Medical History:  Diagnosis Date  . Allergic rhinitis, seasonal   . Allergy   . Anxiety   . Cataract    extraction  . Headache(784.0)   . Hyperlipidemia   . Hypothyroidism   . Left breast mass   . Lung nodules   . Osteopenia   . PONV (postoperative nausea and vomiting)   . Thyroid disease    Past Surgical History:  Procedure Laterality Date  . BREAST EXCISIONAL BIOPSY Left 2018  . BREAST LUMPECTOMY WITH RADIOACTIVE SEED LOCALIZATION Left  12/06/2016   Procedure: LEFT BREAST LUMPECTOMY WITH RADIOACTIVE SEED LOCALIZATION;  Surgeon: Fanny Skates, MD;  Location: [facility];  Service: General;  Laterality: Left;  . CATARACT EXTRACTION W/PHACO  06/26/2011   Procedure: CATARACT EXTRACTION PHACO AND INTRAOCULAR LENS PLACEMENT (IOC);  Surgeon: Tonny Branch, MD;  Location: [facility];  Service: Ophthalmology;  Laterality: Right;  CDE  10.34  . CATARACT EXTRACTION W/PHACO  07/06/2011   Procedure: CATARACT EXTRACTION PHACO AND INTRAOCULAR LENS PLACEMENT (IOC);  Surgeon: Tonny Branch, MD;  Location: [facility];  Service: Ophthalmology;  Laterality: Left;  CDE:9.92  . COLONOSCOPY    . Hysterotomy     fibroid tumor   Family History  Problem Relation Age of Onset  . Heart disease Mother   . Hypertension Mother   . Diabetes Sister   . Hypertension Sister   . COPD Brother   . Anesthesia problems Neg Hx   . Hypotension Neg Hx   . Malignant hyperthermia Neg Hx   . Pseudochol deficiency Neg Hx   . Colon cancer Neg Hx   . Cancer Neg Hx   . Breast cancer Neg Hx    Social History   Socioeconomic History  . Marital status: Married    Spouse name: Richard  . Number of children: 2  . Years of education: Not on file  . Highest education level: Not on file  Occupational History    Employer: RETIRED  Tobacco Use  . Smoking status: Former Smoker    Packs/day: 0.25    Years: 10.00    Pack years: 2.50    Types: Cigarettes    Quit date: 06/07/1973    Years since quitting: 47.0  . Smokeless tobacco: Never Used  . Tobacco comment: Significant second-hand exposure.  Vaping Use  . Vaping Use: Never used  Substance and Sexual Activity  . Alcohol use: Not Currently    Alcohol/week: 0.0 standard drinks    Comment: social  . Drug use: No  . Sexual activity: Yes    Birth control/protection: Post-menopausal  Other Topics Concern  . Not on file  Social History Narrative   Originally from [place]. Always lived in [place]. Previously has traveled from [facility] to [place], [place], [place], [place], [place], [place], & [place]. Has dog. No bird exposure. Had bats in her attic previously that have been removed. No mold or hot tub exposure. Previously worked as an Agricultural consultant in a Restaurant manager, fast food. Previously enjoyed [place]. She has been doing Tai Chi.    Social Determinants of Health   Financial Resource Strain: Low Risk   . Difficulty of Paying Living  Expenses: Not hard at all  Food Insecurity: No Food Insecurity  . Worried About Charity fundraiser in the Last Year: Never true  . Ran Out of Food in the Last Year: Never true  Transportation Needs: No Transportation Needs  . Lack of Transportation (Medical): No  . Lack of Transportation (Non-Medical): No  Physical Activity: Insufficiently Active  . Days of Exercise per Week: 3 days  . Minutes of Exercise per Session: 30 min  Stress: No Stress Concern Present  . Feeling of Stress : Not at all  Social Connections: Socially Integrated  . Frequency of Communication with Friends and Family: More than three times a week  . Frequency of Social Gatherings with Friends and Family: Twice a week  . Attends Religious Services: More than 4 times per year  . Active Member of Clubs or Organizations: Yes  .  Attends Archivist Meetings: More than 4 times per year  . Marital Status: Married    Tobacco Counseling Counseling given: Not Answered Comment: Significant second-hand exposure.   Clinical Intake:  Pre-visit preparation completed: Yes  Pain : No/denies pain     BMI - recorded: 27.07 Nutritional Status: BMI 25 -29 Overweight Nutritional Risks: None Diabetes: No     Diabetic? No  Interpreter Needed?: No  Information entered by :: Meliyah Simon, LPN   Activities of Daily Living In your present state of health, do you have any difficulty performing the following activities: 06/30/2020  Hearing? N  Vision? N  Difficulty concentrating or making decisions? N  Walking or climbing stairs? N  Dressing or bathing? N  Doing errands, shopping? N  Preparing Food and eating ? N  Using the Toilet? N  In the past six months, have you accidently leaked urine? N  Do you have problems with loss of bowel control? N  Managing your Medications? N  Managing your Finances? N  Housekeeping or managing your Housekeeping? N  Some recent data might be hidden    Patient Care  Team: Claretta Fraise, MD as PCP - General (Family Medicine)  Indicate any recent Medical Services you may have received from other than [facility] providers in the past year (date may be approximate).     Assessment:   This is a routine wellness examination for [place].  Hearing/Vision screen  Hearing Screening   125Hz  250Hz  500Hz  1000Hz  2000Hz  3000Hz  4000Hz  6000Hz  8000Hz   Right ear:           Left ear:           Comments: Denies hearing difficulties   Vision Screening Comments: Wears eyeglasses - Annual visits [facility] - up to date with eye exam  Dietary issues and exercise activities discussed: Current Exercise Habits: Home exercise routine, Type of exercise: walking;strength training/weights, Time (Minutes): 30, Frequency (Times/Week): 3, Weekly Exercise (Minutes/Week): 90, Intensity: Moderate, Exercise limited by: None identified  Goals    . Exercise 3x per week (30 min per time)     Goals Addressed             This Visit's Progress   . Exercise 3x per week (30 min per time)                      Depression Screen PHQ 2/9 Scores 06/30/2020 04/26/2020 01/22/2020 12/10/2019 10/22/2019 08/11/2019 08/11/2019  PHQ - 2 Score 0 0 0 0 0 0 0  PHQ- 9 Score - - - - - 2 -    Fall Risk Fall Risk  06/30/2020 04/26/2020 01/22/2020 12/10/2019 10/22/2019  Falls in the past year? - 0 0 0 0  Number falls in past yr: 0 - 0 - -  Injury with Fall? 0 - 0 - -  Risk for fall due to : Medication side effect;Impaired vision - No Fall Risks - -  Follow up Falls prevention discussed;Education provided Falls evaluation completed Falls evaluation completed - Falls evaluation completed    FALL RISK PREVENTION PERTAINING TO THE HOME:  Any stairs in or around the home? Yes  If so, are there any without handrails? No  Home free of loose throw rugs in walkways, pet beds, electrical cords, etc? Yes  Adequate lighting in your home to reduce risk of falls? Yes   ASSISTIVE DEVICES UTILIZED  TO PREVENT FALLS:  Life alert? No  Use of a cane,  walker or w/c? No  Grab bars in the bathroom? No  Shower chair or bench in shower? No  Elevated toilet seat or a handicapped toilet? No   TIMED UP AND GO:  Was the test performed? No .  Telephonic visit.  Cognitive Function: Normal cognitive status assessed by direct observation by this Nurse Health Advisor. No abnormalities found.      6CIT Screen 06/18/2019  What Year? 0 points  What month? 0 points  What time? 0 points  Count back from 20 0 points  Months in reverse 0 points  Repeat phrase 0 points  Total Score 0    Immunizations Immunization History  Administered Date(s) Administered  . Fluad Quad(high Dose 65+) 12/03/2018  . Influenza Split 12/11/2013, 12/20/2016  . Influenza, High Dose Seasonal PF 12/14/2014, 12/15/2015, 12/13/2017, 12/13/2017  . Influenza-Unspecified 12/15/2015  . Moderna Sars-Covid-2 Vaccination 04/08/2019, 05/06/2019, 12/29/2019  . Pneumococcal Conjugate-13 06/16/2015  . Pneumococcal Polysaccharide-23 03/06/2005  . Pneumococcal-Unspecified 03/06/2014  . Td 03/06/2010    TDAP status: Due, Education has been provided regarding the importance of this vaccine. Advised may receive this vaccine at local pharmacy or Health Dept. Aware to provide a copy of the vaccination record if obtained from local pharmacy or Health Dept. Verbalized acceptance and understanding.  Flu Vaccine status: Up to date  Pneumococcal vaccine status: Up to date  Covid-19 vaccine status: Completed vaccines  Qualifies for Shingles Vaccine? Yes   Zostavax completed Yes   Shingrix Completed?: No.    Education has been provided regarding the importance of this vaccine. Patient has been advised to call insurance company to determine out of pocket expense if they have not yet received this vaccine. Advised may also receive vaccine at local pharmacy or Health Dept. Verbalized acceptance and understanding.  Screening Tests Health  Maintenance  Topic Date Due  . COLONOSCOPY (Pts 45-40yr Insurance coverage will need to be confirmed)  06/29/2020  . TETANUS/TDAP  04/26/2021 (Originally 03/06/2020)  . INFLUENZA VACCINE  10/04/2020  . DEXA SCAN  01/05/2021  . COVID-19 Vaccine  Completed  . PNA vac Low Risk Adult  Completed  . HPV VACCINES  Aged Out    Health Maintenance  Health Maintenance Due  Topic Date Due  . COLONOSCOPY (Pts 45-427yrInsurance coverage will need to be confirmed)  06/29/2020    Colorectal cancer screening: No longer required.   Mammogram status: Completed 11/06/2019. Repeat every year  Bone Density status: Completed 01/06/2019. Results reflect: Bone density results: OSTEOPOROSIS. Repeat every 2 years.  Lung Cancer Screening: (Low Dose CT Chest recommended if Age 81-80ears, 30 pack-year currently smoking OR have quit w/in 15years.) does not qualify.   Additional Screening:  Hepatitis C Screening: does not qualify  Vision Screening: Recommended annual ophthalmology exams for early detection of glaucoma and other disorders of the eye. Is the patient up to date with their annual eye exam?  Yes  Who is the provider or what is the name of the office in which the patient attends annual eye exams? [facility] is not established with a provider, would they like to be referred to a provider to establish care? No .   Dental Screening: Recommended annual dental exams for proper oral hygiene  Community Resource Referral / Chronic Care Management: CRR required this visit?  No   CCM required this visit?  No      Plan:     I have personally reviewed and noted the following in the patient's  chart:   . Medical and social history . Use of alcohol, tobacco or illicit drugs  . Current medications and supplements . Functional ability and status . Nutritional status . Physical activity . Advanced directives . List of other physicians . Hospitalizations, surgeries, and ER visits in  previous 12 months . Vitals . Screenings to include cognitive, depression, and falls . Referrals and appointments  In addition, I have reviewed and discussed with patient certain preventive protocols, quality metrics, and best practice recommendations. A written personalized care plan for preventive services as well as general preventive health recommendations were provided to patient.     Sandrea Hammond, LPN   0/90/5025   Nurse Notes: None

## 2020-06-30 NOTE — Patient Instructions (Signed)
Cynthia Garrett , Thank you for taking time to come for your Medicare Wellness Visit. I appreciate your ongoing commitment to your health goals. Please review the following plan we discussed and let me know if I can assist you in the future.   Screening recommendations/referrals: Colonoscopy: Done 06/30/2015 - Repeat in 5 years? Call GI and ask due to age limits Mammogram: Done 11/06/2019 - Repeat every year  Bone Density: Done 11/2/220 - Repeat every 2 years  Recommended yearly ophthalmology/optometry visit for glaucoma screening and checkup Recommended yearly dental visit for hygiene and checkup  Vaccinations: Influenza vaccine: Done 11/2019 - we need actual date Pneumococcal vaccine: Done 03/2014 & 06/16/2015 Tdap vaccine: Done 03/2010 - DUE for repeat (every 10 years) Shingles vaccine: DUE. Shingrix discussed. Please contact your pharmacy for coverage information.    Covid-19: Done 04/08/19, 05/06/19, & 12/29/2019  Advanced directives: Please bring a copy of your health care power of attorney and living will to the office to be added to your chart at your convenience.  Conditions/risks identified: Aim for 30 minutes of exercise or brisk walking each day, drink 6-8 glasses of water and eat lots of fruits and vegetables.  Next appointment: Follow up in one year for your annual wellness visit    Preventive Care 65 Years and Older, Female Preventive care refers to lifestyle choices and visits with your health care provider that can promote health and wellness. What does preventive care include?  A yearly physical exam. This is also called an annual well check.  Dental exams once or twice a year.  Routine eye exams. Ask your health care provider how often you should have your eyes checked.  Personal lifestyle choices, including:  Daily care of your teeth and gums.  Regular physical activity.  Eating a healthy diet.  Avoiding tobacco and drug use.  Limiting alcohol use.  Practicing safe  sex.  Taking low-dose aspirin every day.  Taking vitamin and mineral supplements as recommended by your health care provider. What happens during an annual well check? The services and screenings done by your health care provider during your annual well check will depend on your age, overall health, lifestyle risk factors, and family history of disease. Counseling  Your health care provider may ask you questions about your:  Alcohol use.  Tobacco use.  Drug use.  Emotional well-being.  Home and relationship well-being.  Sexual activity.  Eating habits.  History of falls.  Memory and ability to understand (cognition).  Work and work Statistician.  Reproductive health. Screening  You may have the following tests or measurements:  Height, weight, and BMI.  Blood pressure.  Lipid and cholesterol levels. These may be checked every 5 years, or more frequently if you are over 74 years old.  Skin check.  Lung cancer screening. You may have this screening every year starting at age 62 if you have a 30-pack-year history of smoking and currently smoke or have quit within the past 15 years.  Fecal occult blood test (FOBT) of the stool. You may have this test every year starting at age 62.  Flexible sigmoidoscopy or colonoscopy. You may have a sigmoidoscopy every 5 years or a colonoscopy every 10 years starting at age 7.  Hepatitis C blood test.  Hepatitis B blood test.  Sexually transmitted disease (STD) testing.  Diabetes screening. This is done by checking your blood sugar (glucose) after you have not eaten for a while (fasting). You may have this done every 1-3 years.  Bone density scan. This is done to screen for osteoporosis. You may have this done starting at age 41.  Mammogram. This may be done every 1-2 years. Talk to your health care provider about how often you should have regular mammograms. Talk with your health care provider about your test results,  treatment options, and if necessary, the need for more tests. Vaccines  Your health care provider may recommend certain vaccines, such as:  Influenza vaccine. This is recommended every year.  Tetanus, diphtheria, and acellular pertussis (Tdap, Td) vaccine. You may need a Td booster every 10 years.  Zoster vaccine. You may need this after age 59.  Pneumococcal 13-valent conjugate (PCV13) vaccine. One dose is recommended after age 44.  Pneumococcal polysaccharide (PPSV23) vaccine. One dose is recommended after age 50. Talk to your health care provider about which screenings and vaccines you need and how often you need them. This information is not intended to replace advice given to you by your health care provider. Make sure you discuss any questions you have with your health care provider. Document Released: 03/19/2015 Document Revised: 11/10/2015 Document Reviewed: 12/22/2014 Elsevier Interactive Patient Education  2017 Wall Prevention in the Home Falls can cause injuries. They can happen to people of all ages. There are many things you can do to make your home safe and to help prevent falls. What can I do on the outside of my home?  Regularly fix the edges of walkways and driveways and fix any cracks.  Remove anything that might make you trip as you walk through a door, such as a raised step or threshold.  Trim any bushes or trees on the path to your home.  Use bright outdoor lighting.  Clear any walking paths of anything that might make someone trip, such as rocks or tools.  Regularly check to see if handrails are loose or broken. Make sure that both sides of any steps have handrails.  Any raised decks and porches should have guardrails on the edges.  Have any leaves, snow, or ice cleared regularly.  Use sand or salt on walking paths during winter.  Clean up any spills in your garage right away. This includes oil or grease spills. What can I do in the  bathroom?  Use night lights.  Install grab bars by the toilet and in the tub and shower. Do not use towel bars as grab bars.  Use non-skid mats or decals in the tub or shower.  If you need to sit down in the shower, use a plastic, non-slip stool.  Keep the floor dry. Clean up any water that spills on the floor as soon as it happens.  Remove soap buildup in the tub or shower regularly.  Attach bath mats securely with double-sided non-slip rug tape.  Do not have throw rugs and other things on the floor that can make you trip. What can I do in the bedroom?  Use night lights.  Make sure that you have a light by your bed that is easy to reach.  Do not use any sheets or blankets that are too big for your bed. They should not hang down onto the floor.  Have a firm chair that has side arms. You can use this for support while you get dressed.  Do not have throw rugs and other things on the floor that can make you trip. What can I do in the kitchen?  Clean up any spills right away.  Avoid walking  on wet floors.  Keep items that you use a lot in easy-to-reach places.  If you need to reach something above you, use a strong step stool that has a grab bar.  Keep electrical cords out of the way.  Do not use floor polish or wax that makes floors slippery. If you must use wax, use non-skid floor wax.  Do not have throw rugs and other things on the floor that can make you trip. What can I do with my stairs?  Do not leave any items on the stairs.  Make sure that there are handrails on both sides of the stairs and use them. Fix handrails that are broken or loose. Make sure that handrails are as long as the stairways.  Check any carpeting to make sure that it is firmly attached to the stairs. Fix any carpet that is loose or worn.  Avoid having throw rugs at the top or bottom of the stairs. If you do have throw rugs, attach them to the floor with carpet tape.  Make sure that you have a  light switch at the top of the stairs and the bottom of the stairs. If you do not have them, ask someone to add them for you. What else can I do to help prevent falls?  Wear shoes that:  Do not have high heels.  Have rubber bottoms.  Are comfortable and fit you well.  Are closed at the toe. Do not wear sandals.  If you use a stepladder:  Make sure that it is fully opened. Do not climb a closed stepladder.  Make sure that both sides of the stepladder are locked into place.  Ask someone to hold it for you, if possible.  Clearly mark and make sure that you can see:  Any grab bars or handrails.  First and last steps.  Where the edge of each step is.  Use tools that help you move around (mobility aids) if they are needed. These include:  Canes.  Walkers.  Scooters.  Crutches.  Turn on the lights when you go into a dark area. Replace any light bulbs as soon as they burn out.  Set up your furniture so you have a clear path. Avoid moving your furniture around.  If any of your floors are uneven, fix them.  If there are any pets around you, be aware of where they are.  Review your medicines with your doctor. Some medicines can make you feel dizzy. This can increase your chance of falling. Ask your doctor what other things that you can do to help prevent falls. This information is not intended to replace advice given to you by your health care provider. Make sure you discuss any questions you have with your health care provider. Document Released: 12/17/2008 Document Revised: 07/29/2015 Document Reviewed: 03/27/2014 Elsevier Interactive Patient Education  2017 [employer].

## 2020-07-14 ENCOUNTER — Other Ambulatory Visit: Payer: Self-pay | Admitting: Family Medicine

## 2020-07-14 ENCOUNTER — Telehealth: Payer: Self-pay

## 2020-07-14 NOTE — Telephone Encounter (Signed)
Pt talked to pharmacy this morning to take her off ready fill. I did explain the reason I denied it this morning was that the pharmacy asked for 7.5 mg of the Mirtazapine and this was changed to 15 mg in February. Pt has enough for now & if she needs a refill before her August appt she will let us know

## 2020-07-29 ENCOUNTER — Telehealth: Payer: Self-pay | Admitting: Family Medicine

## 2020-07-29 NOTE — Telephone Encounter (Signed)
Pt thinks she is having thyroid issues, appt made.

## 2020-07-30 ENCOUNTER — Encounter: Payer: Self-pay | Admitting: Family Medicine

## 2020-07-30 ENCOUNTER — Ambulatory Visit (INDEPENDENT_AMBULATORY_CARE_PROVIDER_SITE_OTHER): Payer: Medicare Other | Admitting: Family Medicine

## 2020-07-30 ENCOUNTER — Other Ambulatory Visit: Payer: Self-pay

## 2020-07-30 VITALS — BP 136/81 | HR 77 | Temp 97.2°F | Ht 62.0 in | Wt 151.2 lb

## 2020-07-30 DIAGNOSIS — R002 Palpitations: Secondary | ICD-10-CM

## 2020-07-30 DIAGNOSIS — E039 Hypothyroidism, unspecified: Secondary | ICD-10-CM | POA: Diagnosis not present

## 2020-07-30 DIAGNOSIS — R5383 Other fatigue: Secondary | ICD-10-CM | POA: Diagnosis not present

## 2020-07-30 NOTE — Progress Notes (Signed)
Established Patient Office Visit  Subjective:  Patient ID: Cynthia Garrett, female    DOB: Nov 29, 1940  Age: 80 y.o. MRN: [id number]  CC:  Chief Complaint  Patient presents with  . Fatigue    HP Cynthia Garrett presents for fatigue for a few months. She just feels "blah". She has noticed that her nails have been splitting. She has having hot flashes. She has been having heart palpitations that have been randomly and lasting for a few seconds. Weight has been stable. Denies changes in appetite. Her hair has been drier than usual. She denies chest pain, shortness of breath, edema, or dizziness. She has a history of hypothyroidism. Her last TSH level was a little low. Her synthroid dosage was not changed.   Past Medical History:  Diagnosis Date  . Allergic rhinitis, seasonal   . Allergy   . Anxiety   . Cataract    extraction  . Headache(784.0)   . Hyperlipidemia   . Hypothyroidism   . Left breast mass   . Lung nodules   . Osteopenia   . PONV (postoperative nausea and vomiting)   . Thyroid disease     Past Surgical History:  Procedure Laterality Date  . BREAST EXCISIONAL BIOPSY Left 2018  . BREAST LUMPECTOMY WITH RADIOACTIVE SEED LOCALIZATION Left 12/06/2016   Procedure: LEFT BREAST LUMPECTOMY WITH RADIOACTIVE SEED LOCALIZATION;  Surgeon: Fanny Skates, MD;  Location: [facility];  Service: General;  Laterality: Left;  . CATARACT EXTRACTION W/PHACO  06/26/2011   Procedure: CATARACT EXTRACTION PHACO AND INTRAOCULAR LENS PLACEMENT (IOC);  Surgeon: Tonny Branch, MD;  Location: [facility];  Service: Ophthalmology;  Laterality: Right;  CDE 10.34  . CATARACT EXTRACTION W/PHACO  07/06/2011   Procedure: CATARACT EXTRACTION PHACO AND INTRAOCULAR LENS PLACEMENT (IOC);  Surgeon: Tonny Branch, MD;  Location: [facility];  Service: Ophthalmology;  Laterality: Left;  CDE:9.92  . COLONOSCOPY    . Hysterotomy     fibroid tumor    Family History  Problem Relation Age of Onset  . Heart  disease Mother   . Hypertension Mother   . Diabetes Sister   . Hypertension Sister   . COPD Brother   . Anesthesia problems Neg Hx   . Hypotension Neg Hx   . Malignant hyperthermia Neg Hx   . Pseudochol deficiency Neg Hx   . Colon cancer Neg Hx   . Cancer Neg Hx   . Breast cancer Neg Hx     Social History   Socioeconomic History  . Marital status: Married    Spouse name: Richard  . Number of children: 2  . Years of education: Not on file  . Highest education level: Not on file  Occupational History    Employer: RETIRED  Tobacco Use  . Smoking status: Former Smoker    Packs/day: 0.25    Years: 10.00    Pack years: 2.50    Types: Cigarettes    Quit date: 06/07/1973    Years since quitting: 47.1  . Smokeless tobacco: Never Used  . Tobacco comment: Significant second-hand exposure.  Vaping Use  . Vaping Use: Never used  Substance and Sexual Activity  . Alcohol use: Not Currently    Alcohol/week: 0.0 standard drinks    Comment: social  . Drug use: No  . Sexual activity: Yes    Birth control/protection: Post-menopausal  Other Topics Concern  . Not on file  Social History Narrative   Originally from [place]. Always lived in [place]. Previously  has traveled from [facility] to [place], [place], [place], [place], [place], [place], & [place]. Has dog. No bird exposure. Had bats in her attic previously that have been removed. No mold or hot tub exposure. Previously worked as an Agricultural consultant in a Restaurant manager, fast food. Previously enjoyed [place]. She has been doing Tai Chi.    Social Determinants of Health   Financial Resource Strain: Low Risk   . Difficulty of Paying Living Expenses: Not hard at all  Food Insecurity: No Food Insecurity  . Worried About Charity fundraiser in the Last Year: Never true  . Ran Out of Food in the Last Year: Never true  Transportation Needs: No Transportation Needs  . Lack of Transportation (Medical): No  . Lack of Transportation (Non-Medical): No  Physical Activity:  Insufficiently Active  . Days of Exercise per Week: 3 days  . Minutes of Exercise per Session: 30 min  Stress: No Stress Concern Present  . Feeling of Stress : Not at all  Social Connections: Socially Integrated  . Frequency of Communication with Friends and Family: More than three times a week  . Frequency of Social Gatherings with Friends and Family: Twice a week  . Attends Religious Services: More than 4 times per year  . Active Member of Clubs or Organizations: Yes  . Attends Archivist Meetings: More than 4 times per year  . Marital Status: Married  Human resources officer Violence: Not At Risk  . Fear of Current or Ex-Partner: No  . Emotionally Abused: No  . Physically Abused: No  . Sexually Abused: No    Outpatient Medications Prior to Visit  Medication Sig Dispense Refill  . calcium citrate-vitamin D (CITRACAL+D) 315-200 MG-UNIT per tablet Take 2 tablets by mouth 2 (two) times daily.    . cholecalciferol (VITAMIN D3) 25 MCG (1000 UNIT) tablet Take 1,000 Units by mouth daily.    Marland Kitchen desvenlafaxine (PRISTIQ) 100 MG 24 hr tablet Take 1 tablet (100 mg total) by mouth daily. 90 tablet 1  . mirtazapine (REMERON) 15 MG tablet Take 1 tablet (15 mg total) by mouth at bedtime. For sleep 30 tablet 2  . Multiple Vitamins-Minerals (MULTIVITAMIN WITH MINERALS) tablet Take 1 tablet by mouth daily.    . pravastatin (PRAVACHOL) 40 MG tablet Take 1 tablet (40 mg total) by mouth daily. 90 tablet 1  . SYNTHROID 75 MCG tablet TAKE 1 TABLET (75 MCG TOTAL) BY MOUTH DAILY BEFORE BREAKFAST. NAME BRAND ONLY 90 tablet 3  . trimethoprim (TRIMPEX) 100 MG tablet Take 100 mg by mouth daily.    Marland Kitchen CLINPRO 5000 1.1 % PSTE See admin instructions.     No facility-administered medications prior to visit.    Allergies  Allergen Reactions  . Cholestatin   . Tetanus Toxoids Swelling    Redness at site of injection  . Sertraline Anxiety    ROS Review of Systems As per HPI.    Objective:    Physical  Exam Vitals and nursing note reviewed.  Constitutional:      General: She is not in acute distress.    Appearance: Normal appearance. She is not ill-appearing, toxic-appearing or diaphoretic.  HENT:     Head: Normocephalic and atraumatic.  Eyes:     Extraocular Movements: Extraocular movements intact.     Conjunctiva/sclera: Conjunctivae normal.     Pupils: Pupils are equal, round, and reactive to light.  Neck:     Thyroid: No thyroid mass, thyromegaly or thyroid tenderness.  Cardiovascular:  Rate and Rhythm: Normal rate and regular rhythm.     Heart sounds: Normal heart sounds. No murmur heard.   Pulmonary:     Effort: Pulmonary effort is normal.     Breath sounds: Normal breath sounds.  Musculoskeletal:     Cervical back: No tenderness.     Right lower leg: No edema.     Left lower leg: No edema.  Skin:    General: Skin is warm and dry.  Neurological:     General: No focal deficit present.     Mental Status: She is alert and oriented to person, place, and time.  Psychiatric:        Mood and Affect: Mood normal.        Behavior: Behavior normal.     BP 136/81   Pulse 77   Temp (!) 97.2 F (36.2 C) (Temporal)   Ht 5' 2" (1.575 m)   Wt 151 lb 4 oz (68.6 kg)   BMI 27.66 kg/m  Wt Readings from Last 3 Encounters:  07/30/20 151 lb 4 oz (68.6 kg)  06/30/20 148 lb (67.1 kg)  04/26/20 146 lb 2 oz (66.3 kg)     Health Maintenance Due  Topic Date Due  . Zoster Vaccines- Shingrix (1 of 2) Never done  . COLONOSCOPY (Pts 45-86yr Insurance coverage will need to be confirmed)  06/29/2020    There are no preventive care reminders to display for this patient.  Lab Results  Component Value Date   TSH 0.405 (L) 04/22/2020   Lab Results  Component Value Date   WBC 6.1 04/22/2020   HGB 13.8 04/22/2020   HCT 41.0 04/22/2020   MCV 89 04/22/2020   PLT 225 04/22/2020   Lab Results  Component Value Date   NA 141 04/22/2020   K 4.3 04/22/2020   CO2 21 04/22/2020    GLUCOSE 112 (H) 04/22/2020   BUN 19 04/22/2020   CREATININE 0.81 04/22/2020   BILITOT 0.4 04/22/2020   ALKPHOS 95 04/22/2020   AST 29 04/22/2020   ALT 21 04/22/2020   PROT 7.4 04/22/2020   ALBUMIN 4.5 04/22/2020   CALCIUM 9.8 04/22/2020   ANIONGAP 12 11/30/2016   Lab Results  Component Value Date   CHOL 168 04/22/2020   Lab Results  Component Value Date   HDL 57 04/22/2020   Lab Results  Component Value Date   LDLCALC 83 04/22/2020   Lab Results  Component Value Date   TRIG 162 (H) 04/22/2020   Lab Results  Component Value Date   CHOLHDL 2.9 04/22/2020   No results found for: HGBA1C    Assessment & Plan:   GVallorywas seen today for fatigue.  Diagnoses and all orders for this visit:  Fatigue, unspecified type Labs pending as below. -     TSH -     CBC with Differential/Platelet -     BMP8+EGFR  Palpitations Intermittent, lastly for a few second with no other symptoms. Unremarkable exam today. Labs pending as below.  -     TSH -     CBC with Differential/Platelet -     BMP8+EGFR  Hypothyroidism, unspecified type Last TSH level was low. Currently on synthroid 75 mcg. This could account for some of the symptoms she is having. Labs pending as below. Will adjust dosage if necessary.   -     TSH  Follow-up: Return if symptoms worsen or fail to improve.   The patient indicates understanding of these issues and  agrees with the plan.  Gwenlyn Perking, FNP

## 2020-07-30 NOTE — Patient Instructions (Signed)

## 2020-07-31 LAB — CBC WITH DIFFERENTIAL/PLATELET
Basophils Absolute: 0.1 10*3/uL (ref 0.0–0.2)
Basos: 1 %
EOS (ABSOLUTE): 0.1 10*3/uL (ref 0.0–0.4)
Eos: 2 %
Hematocrit: 38.1 % (ref 34.0–46.6)
Hemoglobin: 12.9 g/dL (ref 11.1–15.9)
Immature Grans (Abs): 0 10*3/uL (ref 0.0–0.1)
Immature Granulocytes: 0 %
Lymphocytes Absolute: 2.1 10*3/uL (ref 0.7–3.1)
Lymphs: 35 %
MCH: 30.5 pg (ref 26.6–33.0)
MCHC: 33.9 g/dL (ref 31.5–35.7)
MCV: 90 fL (ref 79–97)
Monocytes Absolute: 0.5 10*3/uL (ref 0.1–0.9)
Monocytes: 9 %
Neutrophils Absolute: 3.2 10*3/uL (ref 1.4–7.0)
Neutrophils: 53 %
Platelets: 225 10*3/uL (ref 150–450)
RBC: 4.23 x10E6/uL (ref 3.77–5.28)
RDW: 12.5 % (ref 11.7–15.4)
WBC: 6 10*3/uL (ref 3.4–10.8)

## 2020-07-31 LAB — BMP8+EGFR
BUN/Creatinine Ratio: 21 (ref 12–28)
BUN: 15 mg/dL (ref 8–27)
CO2: 22 mmol/L (ref 20–29)
Calcium: 9.6 mg/dL (ref 8.7–10.3)
Chloride: 102 mmol/L (ref 96–106)
Creatinine, Ser: 0.73 mg/dL (ref 0.57–1.00)
Glucose: 99 mg/dL (ref 65–99)
Potassium: 4.7 mmol/L (ref 3.5–5.2)
Sodium: 141 mmol/L (ref 134–144)
eGFR: 83 mL/min/{1.73_m2} (ref >59)

## 2020-07-31 LAB — TSH: TSH: 0.519 u[IU]/mL (ref 0.450–4.500)

## 2020-08-20 IMAGING — DX DG HIP (WITH OR WITHOUT PELVIS) 2-3V*R*
3 series · 3 of 3 positions shown · non-contrast
Comparison: None.

CLINICAL DATA: Right hip pain for several months.

EXAM:
DG HIP (WITH OR WITHOUT PELVIS) 2-3V RIGHT

[hip ap]
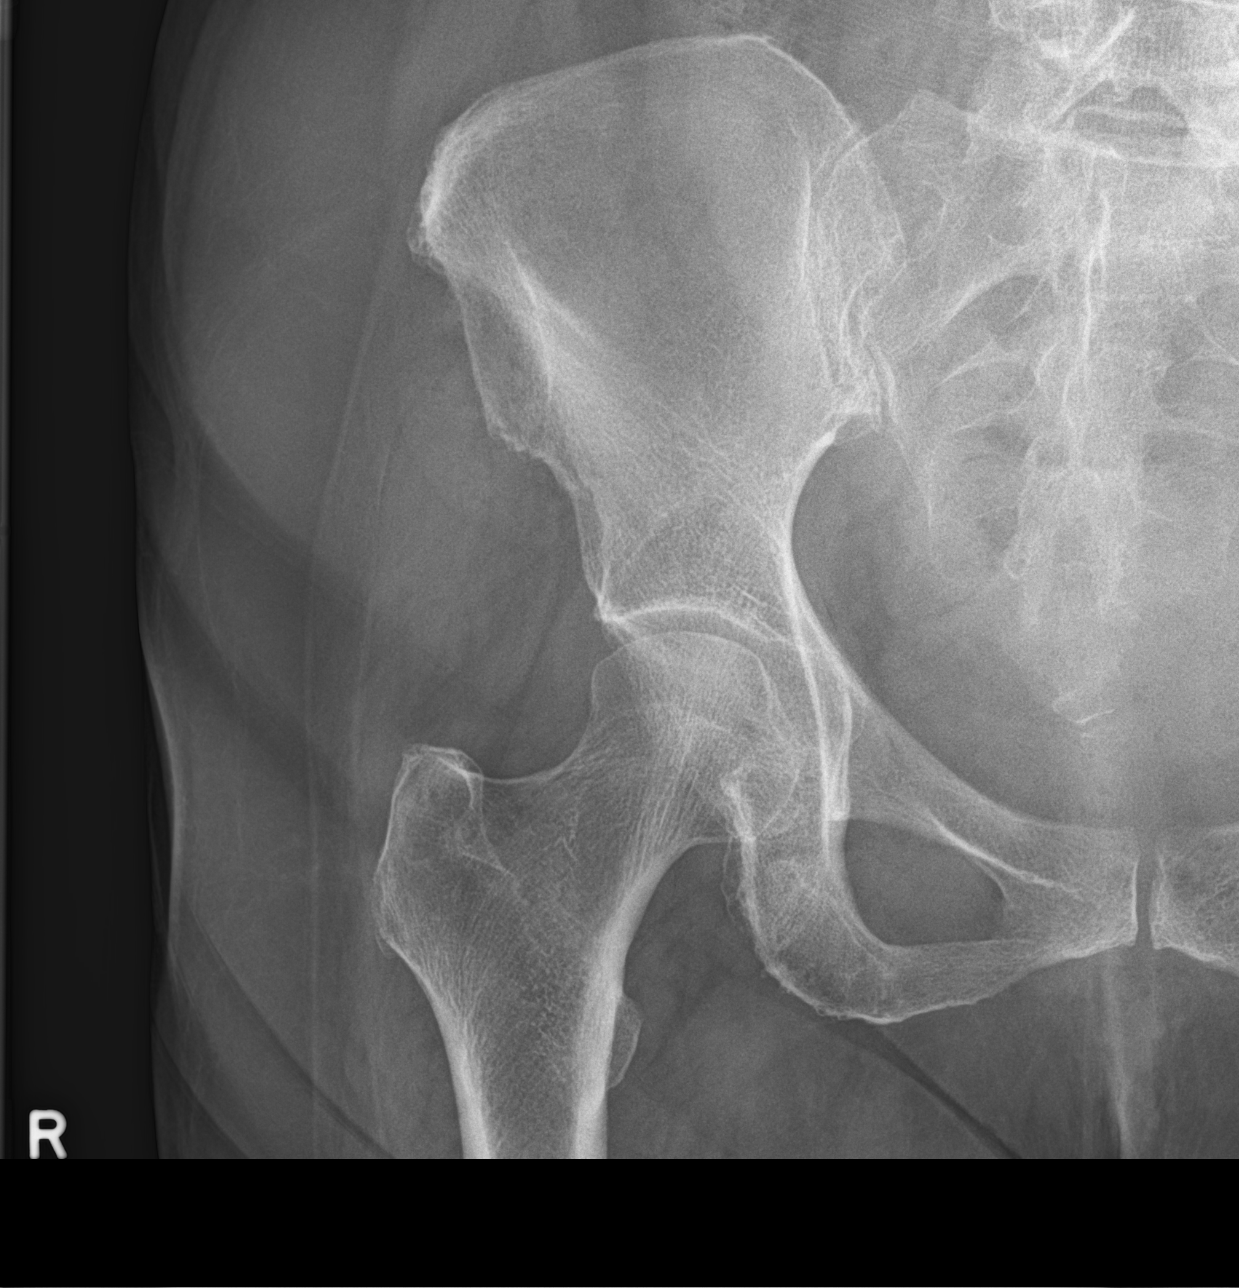

[hip lat]
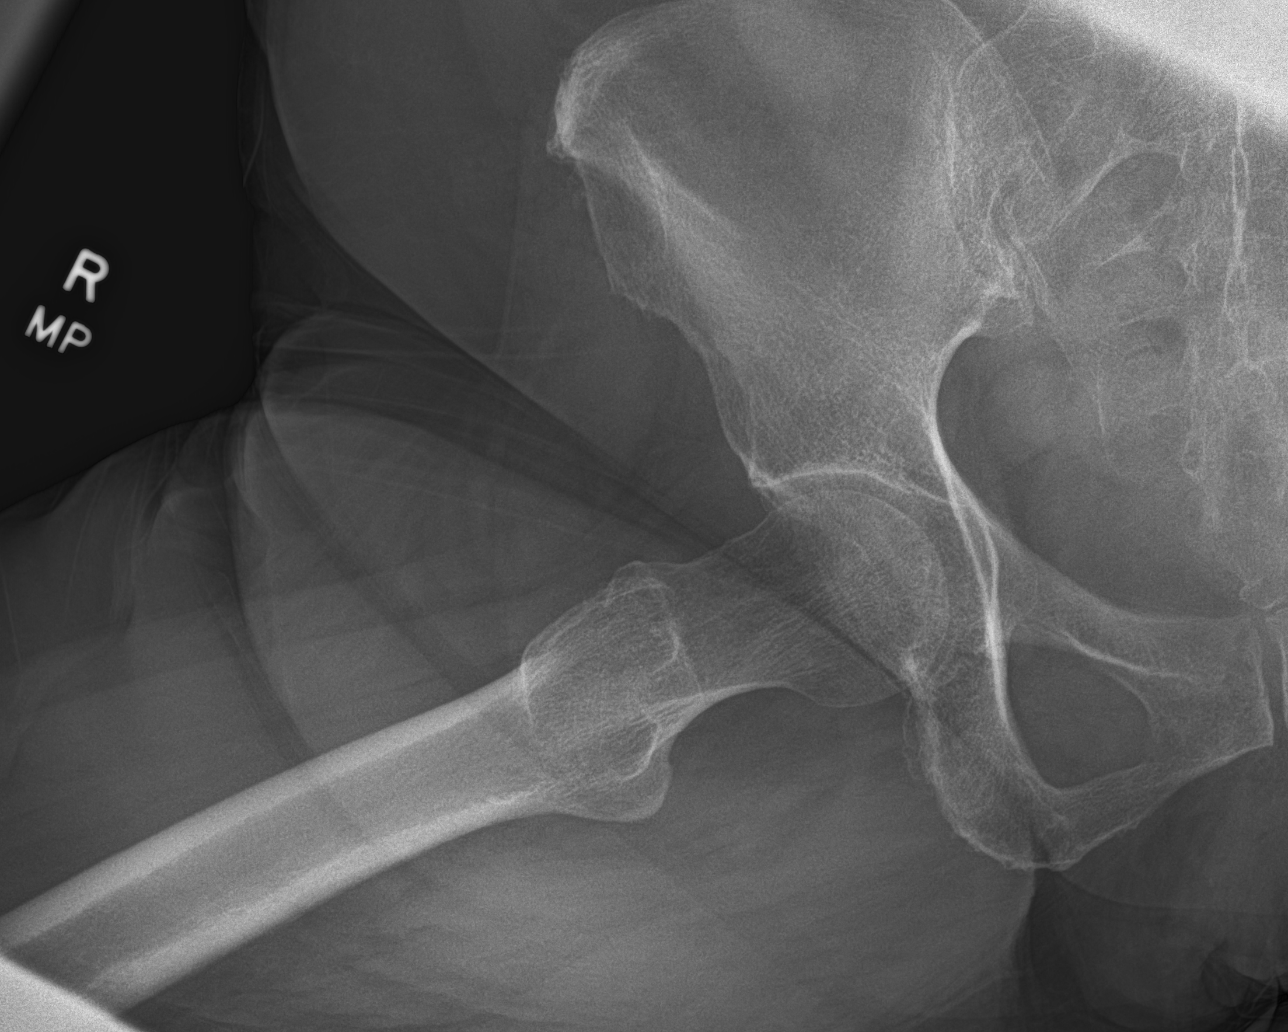

[pelvis ap]
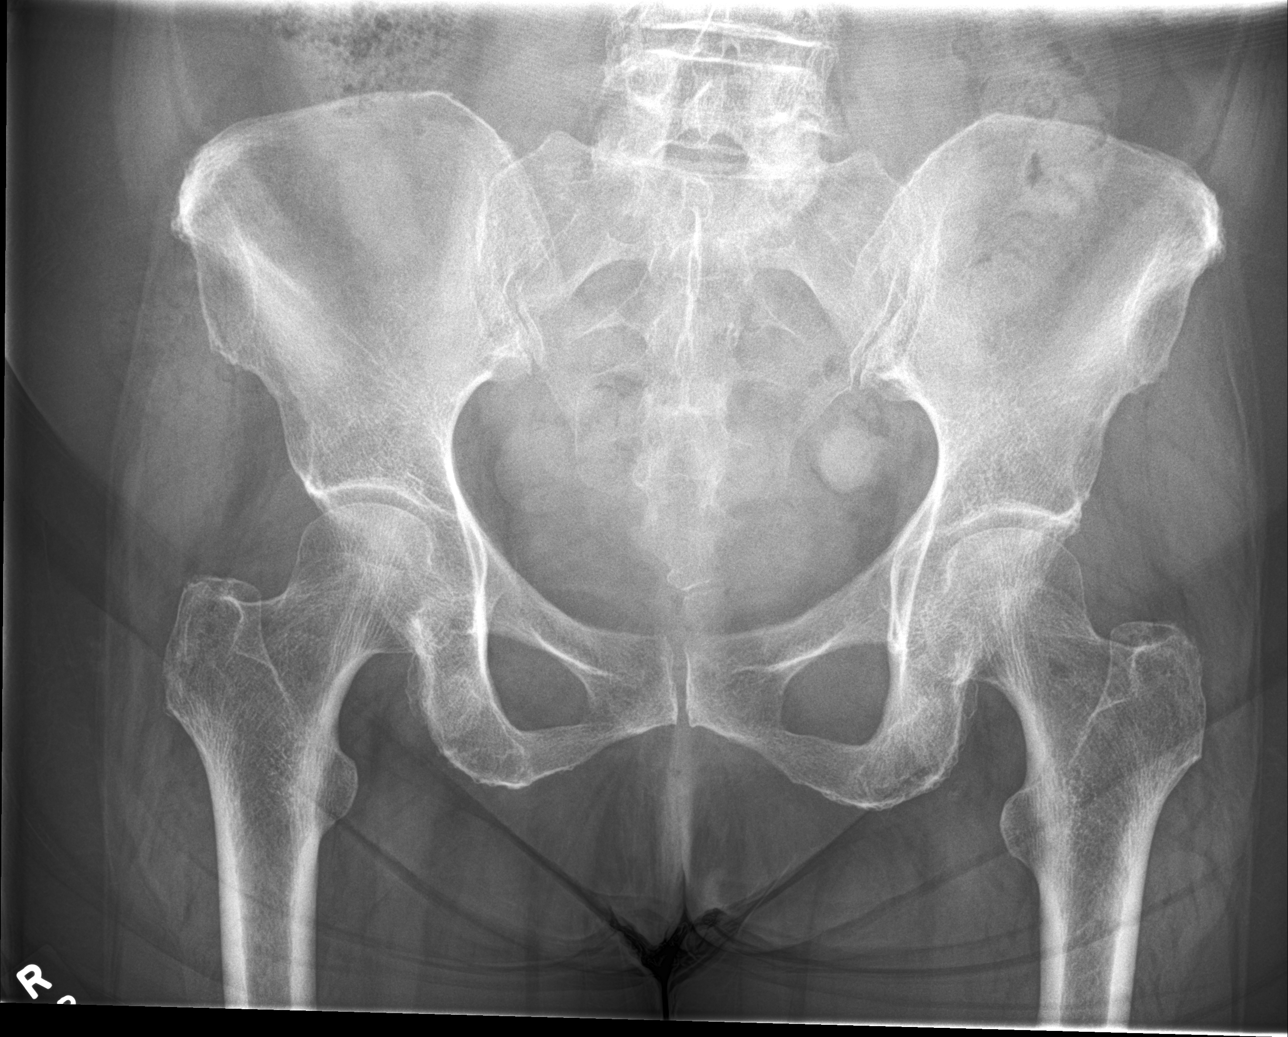

[3 of 3 positions shown; findings below may reference images not displayed]

FINDINGS: There is no evidence of hip fracture or dislocation. There is no
evidence of arthropathy or other focal bone abnormality.
IMPRESSION: Negative.

## 2020-08-20 IMAGING — DX DG LUMBAR SPINE 2-3V
2 series · 2 of 2 positions shown · non-contrast
Comparison: None.

CLINICAL DATA: Low back and right hip pain for several months.

EXAM:
LUMBAR SPINE - 2-3 VIEW

[l-spine ap]
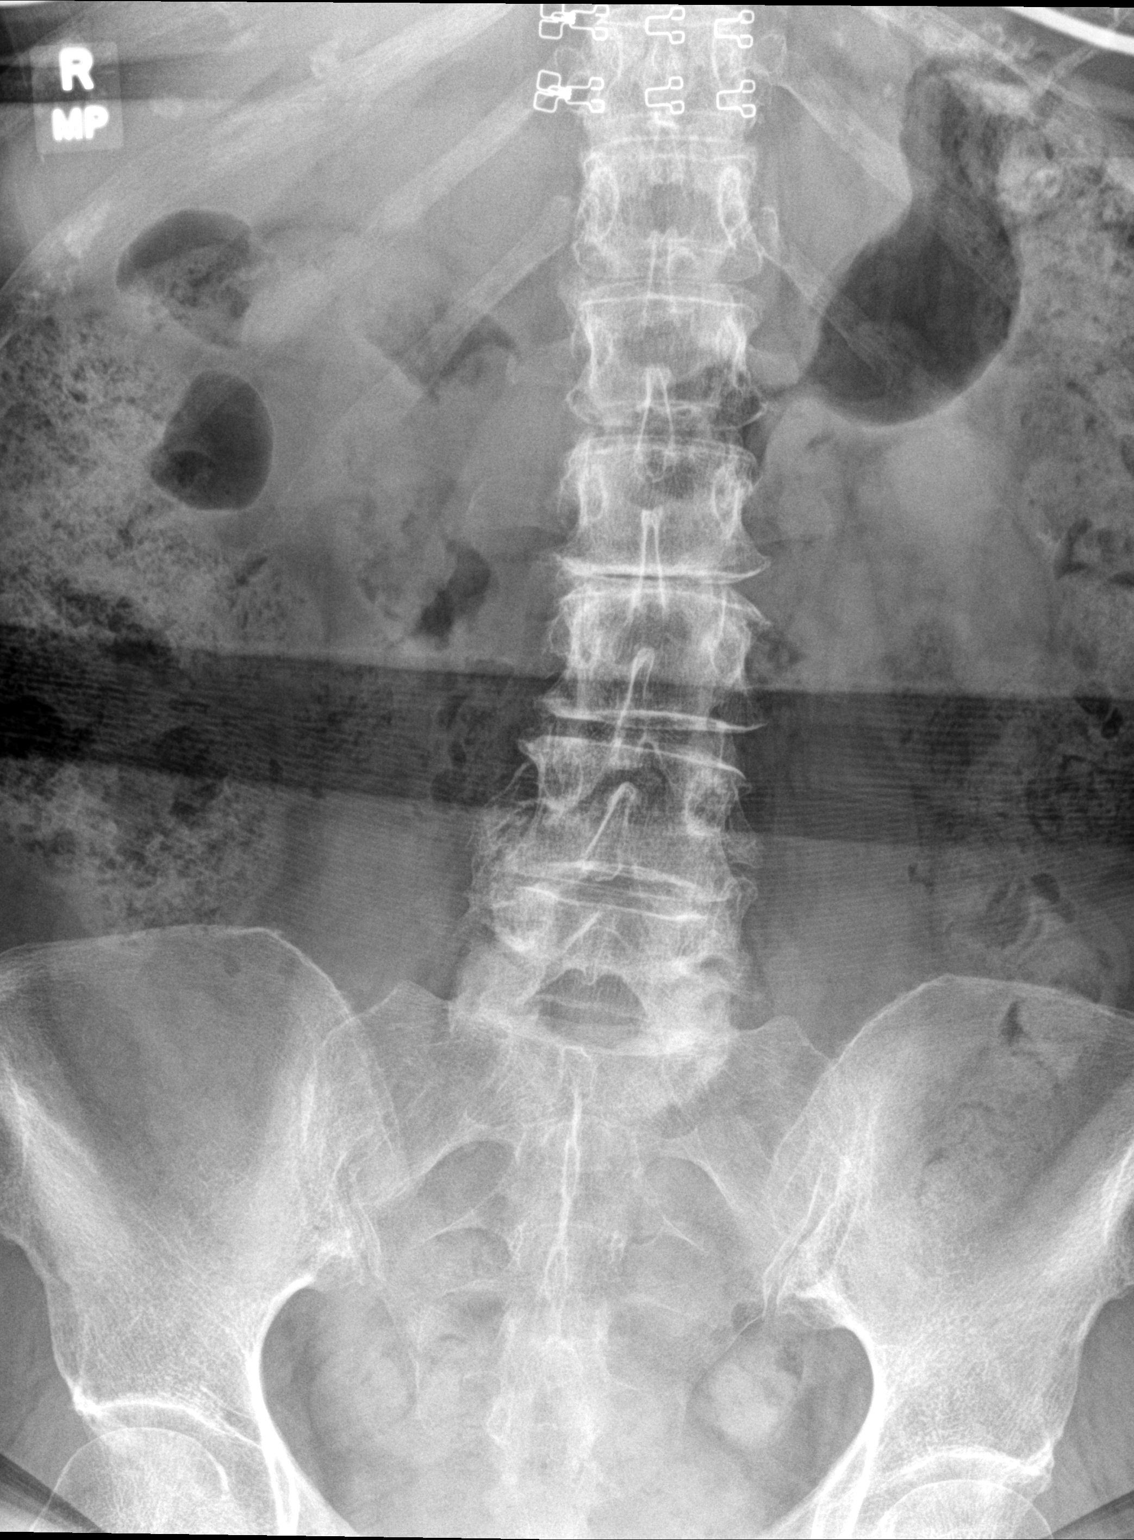

[l-spine lat]
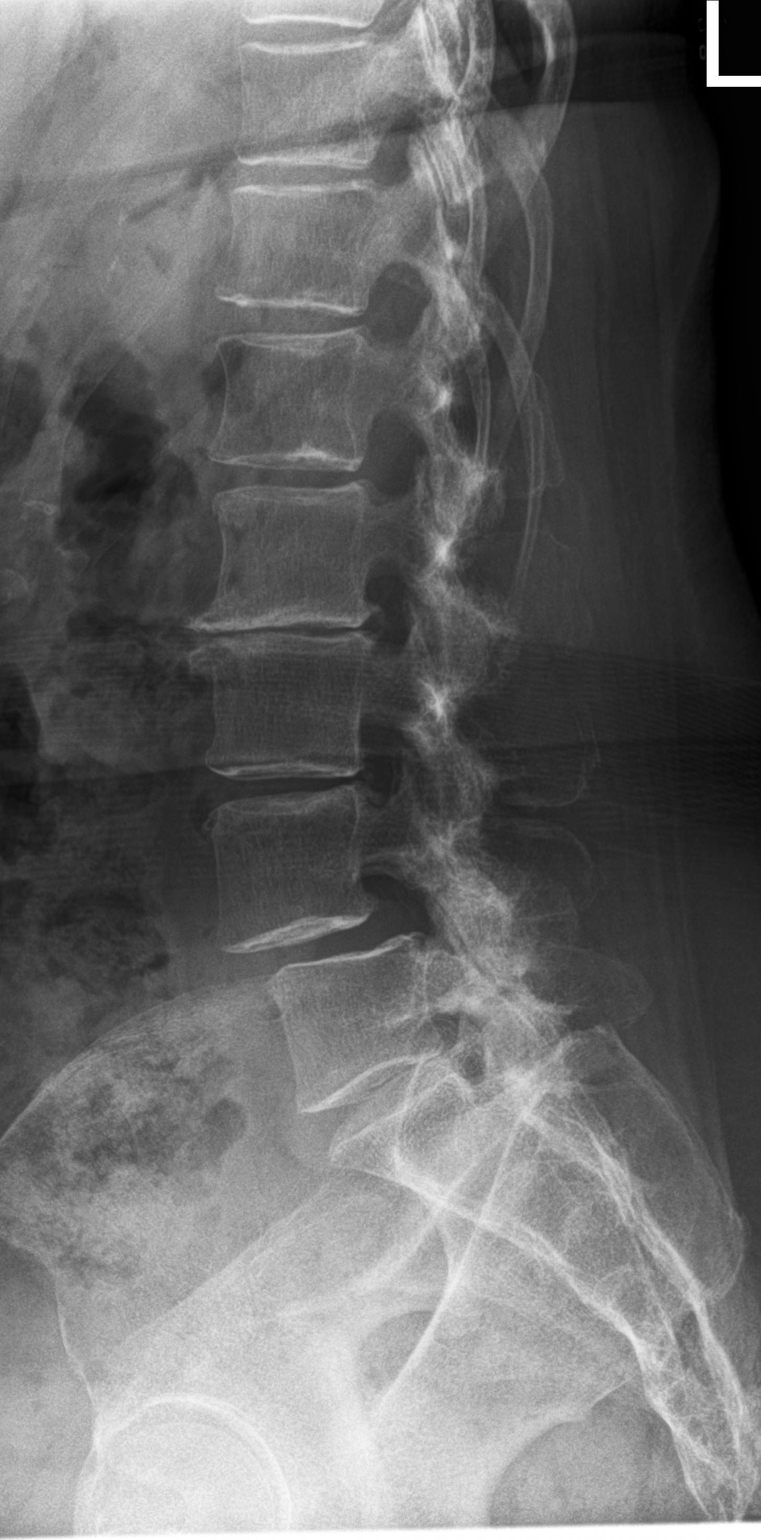

[2 of 2 positions shown; findings below may reference images not displayed]

FINDINGS: Mild grade 1 anterolisthesis of L4-5 is noted secondary to posterior
facet joint hypertrophy. Moderate degenerative disc disease is noted
at L1-2 and L3-4. Severe degenerative disc disease is noted at L2-3
with anterior osteophyte formation. No fracture is noted.
IMPRESSION: Multilevel degenerative disc disease. Mild grade 1 anterolisthesis
of L4-5 is noted secondary to posterior facet joint hypertrophy. No
acute abnormality seen in the lumbar spine.

## 2020-08-26 ENCOUNTER — Other Ambulatory Visit: Payer: Self-pay | Admitting: Family Medicine

## 2020-08-26 DIAGNOSIS — F514 Sleep terrors [night terrors]: Secondary | ICD-10-CM

## 2020-09-01 ENCOUNTER — Ambulatory Visit: Payer: Medicare Other | Admitting: Family Medicine

## 2020-09-16 DIAGNOSIS — M79671 Pain in right foot: Secondary | ICD-10-CM | POA: Diagnosis not present

## 2020-09-16 DIAGNOSIS — D2371 Other benign neoplasm of skin of right lower limb, including hip: Secondary | ICD-10-CM | POA: Diagnosis not present

## 2020-09-22 ENCOUNTER — Other Ambulatory Visit: Payer: Self-pay | Admitting: Family Medicine

## 2020-09-22 DIAGNOSIS — Z1231 Encounter for screening mammogram for malignant neoplasm of breast: Secondary | ICD-10-CM

## 2020-10-25 ENCOUNTER — Other Ambulatory Visit: Payer: Self-pay

## 2020-10-25 ENCOUNTER — Encounter: Payer: Self-pay | Admitting: Family Medicine

## 2020-10-25 ENCOUNTER — Other Ambulatory Visit: Payer: Self-pay | Admitting: Family Medicine

## 2020-10-25 ENCOUNTER — Ambulatory Visit (INDEPENDENT_AMBULATORY_CARE_PROVIDER_SITE_OTHER): Payer: Medicare Other | Admitting: Family Medicine

## 2020-10-25 VITALS — BP 130/76 | HR 79 | Temp 97.4°F | Ht 62.0 in | Wt 149.8 lb

## 2020-10-25 DIAGNOSIS — K582 Mixed irritable bowel syndrome: Secondary | ICD-10-CM

## 2020-10-25 DIAGNOSIS — R232 Flushing: Secondary | ICD-10-CM | POA: Diagnosis not present

## 2020-10-25 DIAGNOSIS — E039 Hypothyroidism, unspecified: Secondary | ICD-10-CM | POA: Diagnosis not present

## 2020-10-25 DIAGNOSIS — F514 Sleep terrors [night terrors]: Secondary | ICD-10-CM

## 2020-10-25 DIAGNOSIS — E782 Mixed hyperlipidemia: Secondary | ICD-10-CM | POA: Diagnosis not present

## 2020-10-25 MED ORDER — DESVENLAFAXINE SUCCINATE ER 100 MG PO TB24: 100.0000 mg | ORAL_TABLET | Freq: Every day | ORAL | 3 refills | Status: DC

## 2020-10-25 MED ORDER — MIRTAZAPINE 15 MG PO TABS: 15.0000 mg | ORAL_TABLET | Freq: Every day | ORAL | 3 refills | Status: DC

## 2020-10-25 MED ORDER — PRAVASTATIN SODIUM 40 MG PO TABS: 40.0000 mg | ORAL_TABLET | Freq: Every day | ORAL | 3 refills | Status: DC

## 2020-10-25 NOTE — Progress Notes (Signed)
Subjective:  Patient ID: Cynthia Garrett, female    DOB: February 23, 1941  Age: 80 y.o. MRN: [id number]  CC: Medical Management of Chronic Issues   HPI Cynthia Garrett presents for follow-up of elevated cholesterol. Doing well without complaints on current medication. Denies side effects of statin including myalgia and arthralgia and nausea. Also in today for liver function testing. Currently no chest pain, shortness of breath or other cardiovascular related symptoms noted.  HEart flutters then she overheats.Sweats. Flushing.  Onset was  several months ago. BP drops. Lasting a few seconds each.  Some at night. Talking in her sleep, but the night terrors resolved.   IBS under control, managing sx okay.  History Cynthia Garrett has a past medical history of Allergic rhinitis, seasonal, Allergy, Anxiety, Cataract, Headache(784.0), Hyperlipidemia, Hypothyroidism, Left breast mass, Lung nodules, Osteopenia, PONV (postoperative nausea and vomiting), and Thyroid disease.   She has a past surgical history that includes Hysterotomy; Colonoscopy; Cataract extraction w/PHACO (06/26/2011); Cataract extraction w/PHACO (07/06/2011); Breast lumpectomy with radioactive seed localization (Left, 12/06/2016); and Breast excisional biopsy (Left, 2018).   Her family history includes COPD in her brother; Diabetes in her sister; Heart disease in her mother; Hypertension in her mother and sister.She reports that she quit smoking about 47 years ago. Her smoking use included cigarettes. She has a 2.50 pack-year smoking history. She has never used smokeless tobacco. She reports that she does not currently use alcohol. She reports that she does not use drugs.  Current Outpatient Medications on File Prior to Visit  Medication Sig Dispense Refill   calcium citrate-vitamin D (CITRACAL+D) 315-200 MG-UNIT per tablet Take 2 tablets by mouth 2 (two) times daily.     cholecalciferol (VITAMIN D3) 25 MCG (1000 UNIT) tablet Take 1,000 Units by  mouth daily.     Multiple Vitamins-Minerals (MULTIVITAMIN WITH MINERALS) tablet Take 1 tablet by mouth daily.     SYNTHROID 75 MCG tablet TAKE 1 TABLET (75 MCG TOTAL) BY MOUTH DAILY BEFORE BREAKFAST. NAME BRAND ONLY 90 tablet 3   trimethoprim (TRIMPEX) 100 MG tablet Take 100 mg by mouth daily.     No current facility-administered medications on file prior to visit.    ROS Review of Systems  Objective:  BP 130/76   Pulse 79   Temp (!) 97.4 F (36.3 C)   Ht 5' 2"  (1.575 m)   Wt 149 lb 12.8 oz (67.9 kg)   SpO2 99%   BMI 27.40 kg/m   BP Readings from Last 3 Encounters:  10/25/20 130/76  07/30/20 136/81  04/26/20 122/79    Wt Readings from Last 3 Encounters:  10/25/20 149 lb 12.8 oz (67.9 kg)  07/30/20 151 lb 4 oz (68.6 kg)  06/30/20 148 lb (67.1 kg)     Physical Exam  No results found for: HGBA1C  Lab Results  Component Value Date   WBC 6.0 07/30/2020   HGB 12.9 07/30/2020   HCT 38.1 07/30/2020   PLT 225 07/30/2020   GLUCOSE 99 07/30/2020   CHOL 168 04/22/2020   TRIG 162 (H) 04/22/2020   HDL 57 04/22/2020   LDLCALC 83 04/22/2020   ALT 21 04/22/2020   AST 29 04/22/2020   NA 141 07/30/2020   K 4.7 07/30/2020   CL 102 07/30/2020   CREATININE 0.73 07/30/2020   BUN 15 07/30/2020   CO2 22 07/30/2020   TSH 0.519 07/30/2020    US Soft Tissue Head/Neck (NON-THYROID)  Result Date: 12/18/2019 CLINICAL DATA:  Enlarged left cervical lymph node  for the past 2 weeks. EXAM: ULTRASOUND OF HEAD/NECK SOFT TISSUES TECHNIQUE: Ultrasound examination of the head and neck soft tissues was performed in the area of clinical concern. COMPARISON:  None. FINDINGS: Sonographic evaluation of the patient's palpable area of concern appears to correlate with a benign appearing non pathologically enlarged subcutaneous lymph node. The lymph node is not enlarged by size criteria measuring 0.3 cm in greatest short axis diameter and maintains a benign fatty hilum (image 10). Otherwise, there is  no sonographic correlate for patient's palpable area of concern. Specifically, no discrete solid or cystic lesions. IMPRESSION: Patient's palpable area of concern appears to correlate with a benign appearing non pathologically enlarged subcutaneous lymph node, nonspecific though presumably reactive in etiology. Electronically Signed   By: Sandi Mariscal M.D.   On: 12/18/2019 16:05    Assessment & Plan:   Cynthia Garrett was seen today for medical management of chronic issues.  Diagnoses and all orders for this visit:  Hypothyroidism, unspecified type -     BMP8+EGFR -     TSH + free T4  Night terrors, adult -     desvenlafaxine (PRISTIQ) 100 MG 24 hr tablet; Take 1 tablet (100 mg total) by mouth daily. -     mirtazapine (REMERON) 15 MG tablet; Take 1 tablet (15 mg total) by mouth at bedtime. For sleep  Mixed hyperlipidemia -     pravastatin (PRAVACHOL) 40 MG tablet; Take 1 tablet (40 mg total) by mouth daily. -     Lipid panel -     BMP8+EGFR  Irritable bowel syndrome with both constipation and diarrhea  Hot flashes -     FSH/LH -     Estrogens, Total -     TSH + free T4  I am having William C. Rowe Robert maintain her multivitamin with minerals, calcium citrate-vitamin D, trimethoprim, cholecalciferol, Synthroid, desvenlafaxine, mirtazapine, and pravastatin.  Meds ordered this encounter  Medications   desvenlafaxine (PRISTIQ) 100 MG 24 hr tablet    Sig: Take 1 tablet (100 mg total) by mouth daily.    Dispense:  90 tablet    Refill:  3   mirtazapine (REMERON) 15 MG tablet    Sig: Take 1 tablet (15 mg total) by mouth at bedtime. For sleep    Dispense:  90 tablet    Refill:  3   pravastatin (PRAVACHOL) 40 MG tablet    Sig: Take 1 tablet (40 mg total) by mouth daily.    Dispense:  90 tablet    Refill:  3     Follow-up: No follow-ups on file.  Claretta Fraise, M.D.

## 2020-10-26 NOTE — Progress Notes (Signed)
Hello Adelaida,  Your lab result is normal and/or stable.Some minor variations that are not significant are commonly marked abnormal, but do not represent any medical problem for you.  Best regards, Genever Hentges, M.D.

## 2020-10-28 LAB — BMP8+EGFR
BUN/Creatinine Ratio: 17 (ref 12–28)
BUN: 14 mg/dL (ref 8–27)
CO2: 23 mmol/L (ref 20–29)
Calcium: 9.5 mg/dL (ref 8.7–10.3)
Chloride: 100 mmol/L (ref 96–106)
Creatinine, Ser: 0.81 mg/dL (ref 0.57–1.00)
Glucose: 103 mg/dL — ABNORMAL HIGH (ref 65–99)
Potassium: 4.5 mmol/L (ref 3.5–5.2)
Sodium: 139 mmol/L (ref 134–144)
eGFR: 73 mL/min/{1.73_m2} (ref >59)

## 2020-10-28 LAB — TSH+FREE T4
Free T4: 1.06 ng/dL (ref 0.82–1.77)
TSH: 1.33 u[IU]/mL (ref 0.450–4.500)

## 2020-10-28 LAB — ESTROGENS, TOTAL: Estrogen: 96 pg/mL (ref 40–244)

## 2020-10-28 LAB — FSH/LH
FSH: 87.5 m[IU]/mL
LH: 26.2 m[IU]/mL

## 2020-10-28 LAB — LIPID PANEL
Chol/HDL Ratio: 3.9 ratio (ref 0.0–4.4)
Cholesterol, Total: 197 mg/dL (ref 100–199)
HDL: 50 mg/dL (ref >39)
LDL Chol Calc (NIH): 101 mg/dL — ABNORMAL HIGH (ref 0–99)
Triglycerides: 272 mg/dL — ABNORMAL HIGH (ref 0–149)
VLDL Cholesterol Cal: 46 mg/dL — ABNORMAL HIGH (ref 5–40)

## 2020-11-15 ENCOUNTER — Other Ambulatory Visit: Payer: Self-pay

## 2020-11-15 ENCOUNTER — Ambulatory Visit
Admission: RE | Admit: 2020-11-15 | Discharge: 2020-11-15 | Disposition: A | Discharge status: Home / Self Care | Payer: Medicare Other | Source: Ambulatory Visit | Attending: Family Medicine | Admitting: Family Medicine

## 2020-11-15 DIAGNOSIS — Z1231 Encounter for screening mammogram for malignant neoplasm of breast: Secondary | ICD-10-CM

## 2020-11-18 ENCOUNTER — Other Ambulatory Visit: Payer: Self-pay | Admitting: Family Medicine

## 2020-11-18 DIAGNOSIS — N6489 Other specified disorders of breast: Secondary | ICD-10-CM

## 2020-11-25 ENCOUNTER — Other Ambulatory Visit: Payer: Self-pay | Admitting: Family Medicine

## 2020-11-25 DIAGNOSIS — R928 Other abnormal and inconclusive findings on diagnostic imaging of breast: Secondary | ICD-10-CM

## 2020-11-29 ENCOUNTER — Ambulatory Visit: Payer: Medicare Other

## 2020-11-29 ENCOUNTER — Other Ambulatory Visit: Payer: Medicare Other

## 2020-11-29 ENCOUNTER — Other Ambulatory Visit: Payer: Self-pay

## 2020-11-29 ENCOUNTER — Ambulatory Visit
Admission: RE | Admit: 2020-11-29 | Discharge: 2020-11-29 | Disposition: A | Discharge status: Home / Self Care | Payer: Medicare Other | Source: Ambulatory Visit | Attending: Family Medicine | Admitting: Family Medicine

## 2020-11-29 DIAGNOSIS — R922 Inconclusive mammogram: Secondary | ICD-10-CM | POA: Diagnosis not present

## 2020-11-29 DIAGNOSIS — R928 Other abnormal and inconclusive findings on diagnostic imaging of breast: Secondary | ICD-10-CM

## 2020-12-02 DIAGNOSIS — L218 Other seborrheic dermatitis: Secondary | ICD-10-CM | POA: Diagnosis not present

## 2020-12-02 DIAGNOSIS — D225 Melanocytic nevi of trunk: Secondary | ICD-10-CM | POA: Diagnosis not present

## 2020-12-02 DIAGNOSIS — Z1283 Encounter for screening for malignant neoplasm of skin: Secondary | ICD-10-CM | POA: Diagnosis not present

## 2020-12-02 DIAGNOSIS — L578 Other skin changes due to chronic exposure to nonionizing radiation: Secondary | ICD-10-CM | POA: Diagnosis not present

## 2020-12-13 ENCOUNTER — Other Ambulatory Visit: Payer: Medicare Other

## 2020-12-22 ENCOUNTER — Other Ambulatory Visit: Payer: Self-pay | Admitting: Family Medicine

## 2020-12-22 DIAGNOSIS — E039 Hypothyroidism, unspecified: Secondary | ICD-10-CM

## 2021-01-06 DIAGNOSIS — M79671 Pain in right foot: Secondary | ICD-10-CM | POA: Diagnosis not present

## 2021-01-06 DIAGNOSIS — D2371 Other benign neoplasm of skin of right lower limb, including hip: Secondary | ICD-10-CM | POA: Diagnosis not present

## 2021-01-06 DIAGNOSIS — M216X1 Other acquired deformities of right foot: Secondary | ICD-10-CM | POA: Diagnosis not present

## 2021-03-01 ENCOUNTER — Ambulatory Visit: Payer: Medicare Other | Admitting: Podiatry

## 2021-03-01 ENCOUNTER — Ambulatory Visit (INDEPENDENT_AMBULATORY_CARE_PROVIDER_SITE_OTHER): Payer: Medicare Other

## 2021-03-01 ENCOUNTER — Other Ambulatory Visit: Payer: Self-pay

## 2021-03-01 DIAGNOSIS — S9031XA Contusion of right foot, initial encounter: Secondary | ICD-10-CM

## 2021-03-03 NOTE — Progress Notes (Signed)
Subjective:   Patient ID: Cynthia Garrett, female   DOB: 80 y.o.   MRN: [id number]   HPI 80 year old female presents today for second opinion given painful lesion on the plantar aspect of right foot submetatarsal 5.  She states that she has had this for quite some time and she used to get debridements every year then every 6 months and every 3 months and now it is occurring after 1 month of having it trimmed.  She was discussed other options or even surgery to help with this.  She denies any open sores or any swelling but is limiting her activities.  She has tried moisturizing as well changing shoes.  Sketchers seem to help.   Review of Systems  All other systems reviewed and are negative.  Past Medical History:  Diagnosis Date   Allergic rhinitis, seasonal    Allergy    Anxiety    Cataract    extraction   Headache(784.0)    Hyperlipidemia    Hypothyroidism    Left breast mass    Lung nodules    Osteopenia    PONV (postoperative nausea and vomiting)    Thyroid disease     Past Surgical History:  Procedure Laterality Date   BREAST EXCISIONAL BIOPSY Left 2018   BREAST LUMPECTOMY WITH RADIOACTIVE SEED LOCALIZATION Left 12/06/2016   Procedure: LEFT BREAST LUMPECTOMY WITH RADIOACTIVE SEED LOCALIZATION;  Surgeon: Fanny Skates, MD;  Location: [facility];  Service: General;  Laterality: Left;   CATARACT EXTRACTION W/PHACO  06/26/2011   Procedure: CATARACT EXTRACTION PHACO AND INTRAOCULAR LENS PLACEMENT ([facility]);  Surgeon: Tonny Branch, MD;  Location: [facility];  Service: Ophthalmology;  Laterality: Right;  CDE 10.34   CATARACT EXTRACTION W/PHACO  07/06/2011   Procedure: CATARACT EXTRACTION PHACO AND INTRAOCULAR LENS PLACEMENT (IOC);  Surgeon: Tonny Branch, MD;  Location: [facility];  Service: Ophthalmology;  Laterality: Left;  CDE:9.92   COLONOSCOPY     Hysterotomy     fibroid tumor     Current Outpatient Medications:    calcium citrate-vitamin D (CITRACAL+D) 315-200 MG-UNIT per  tablet, Take 2 tablets by mouth 2 (two) times daily., Disp: , Rfl:    cholecalciferol (VITAMIN D3) 25 MCG (1000 UNIT) tablet, Take 1,000 Units by mouth daily., Disp: , Rfl:    desvenlafaxine (PRISTIQ) 100 MG 24 hr tablet, Take 1 tablet (100 mg total) by mouth daily., Disp: 90 tablet, Rfl: 3   mirtazapine (REMERON) 15 MG tablet, Take 1 tablet (15 mg total) by mouth at bedtime. For sleep, Disp: 90 tablet, Rfl: 3   Multiple Vitamins-Minerals (MULTIVITAMIN WITH MINERALS) tablet, Take 1 tablet by mouth daily., Disp: , Rfl:    pravastatin (PRAVACHOL) 40 MG tablet, Take 1 tablet (40 mg total) by mouth daily., Disp: 90 tablet, Rfl: 3   SYNTHROID 75 MCG tablet, TAKE 1 TABLET (75 MCG TOTAL) BY MOUTH DAILY BEFORE BREAKFAST. NAME BRAND ONLY, Disp: 90 tablet, Rfl: 3   trimethoprim (TRIMPEX) 100 MG tablet, Take 100 mg by mouth daily., Disp: , Rfl:   Allergies  Allergen Reactions   Cholestatin    Tetanus Toxoids Swelling    Redness at site of injection   Sertraline Anxiety          Objective:  Physical Exam  General: AAO x3, NAD  Dermatological: Hyperkeratotic lesion right foot submetatarsal 5 without any underlying ulceration drainage or signs of infection.  Minimal callus submetatarsal 1 which he thinks is likely more from compensation.  No signs of infection  or ulcerations today.  Vascular: Dorsalis Pedis artery and Posterior Tibial artery pedal pulses are 2/4 bilateral with immedate capillary fill time. There is no pain with calf compression, swelling, warmth, erythema.   Neruologic: Grossly intact via light touch bilateral.   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  Muscular strength 5/5 in all groups tested bilateral.  Gait: Unassisted, Nonantalgic.       Assessment:   Painful skin lesion right foot, cavus foot     Plan:  -Treatment options discussed including all alternatives, risks, and complications -Etiology of symptoms were  discussed -X-rays were obtained and reviewed with the patient.  Cavus foot type is present.  There is no acute fracture, osteomyelitis. -We will discussion regards with conservative as well as surgical treatment options.  Conservatively discussed continued debridements, offloading as well as moisturizer.  She did not seem interested in inserts.  Discussed with her try to bring her shoes into see our orthotist, Guadlupe Spanish, for this to see if there is anything else that we can do to help take the pressure off and without surgery.  Surgically we did discuss shaving the bone to help limit pressure we discussed this is not a guarantee there is also risks of surgery and also transfer lesions or its not helpful.  She does not consider surgical intervention as well.  We will try obtain medical clearance should she want to proceed with this.  Trula Slade DPM

## 2021-03-09 ENCOUNTER — Other Ambulatory Visit: Payer: Medicare Other

## 2021-03-09 ENCOUNTER — Encounter: Payer: Self-pay | Admitting: Podiatry

## 2021-03-10 ENCOUNTER — Encounter: Payer: Self-pay | Admitting: Podiatry

## 2021-03-10 NOTE — Telephone Encounter (Signed)
Please schedule for appointment.

## 2021-03-28 ENCOUNTER — Encounter: Payer: Self-pay | Admitting: Family Medicine

## 2021-03-28 ENCOUNTER — Ambulatory Visit (INDEPENDENT_AMBULATORY_CARE_PROVIDER_SITE_OTHER): Payer: Medicare Other | Admitting: Family Medicine

## 2021-03-28 VITALS — BP 135/73 | HR 78 | Temp 97.0°F | Ht 62.0 in | Wt 143.6 lb

## 2021-03-28 DIAGNOSIS — E782 Mixed hyperlipidemia: Secondary | ICD-10-CM

## 2021-03-28 DIAGNOSIS — R609 Edema, unspecified: Secondary | ICD-10-CM

## 2021-03-28 DIAGNOSIS — E039 Hypothyroidism, unspecified: Secondary | ICD-10-CM | POA: Diagnosis not present

## 2021-03-28 DIAGNOSIS — Z01818 Encounter for other preprocedural examination: Secondary | ICD-10-CM

## 2021-03-28 MED ORDER — HYDROCHLOROTHIAZIDE 12.5 MG PO CAPS: ORAL_CAPSULE | ORAL | 2 refills | Status: DC

## 2021-03-28 NOTE — Progress Notes (Addendum)
Subjective:  Patient ID: Cynthia Garrett, female    DOB: 1941/01/10  Age: 81 y.o. MRN: [id number]  CC: Pre-op Exam   HPI AKUA BLETHEN presents for pre op clearance to have a mass removed from the right fifth toe. 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 He plans to use general anesthesia. Pt. Says she will be "laid up for 5 weeks. He medical history is negative for cardiac conditioins, vascular conditions including stroke & TIA.     The patient has a history of hypothyroidism for many years. It has been stable recently. Pt. denies any change in  voice, loss of hair, heat or cold intolerance. Energy level has been adequate to good. Patient denies constipation and diarrhea. No myxedema. Medication is as noted below. Verified that pt is taking it daily on an empty stomach. Well tolerated.   She is also here for follow-up of elevated cholesterol. Doing well without complaints on current medication. Denies side effects of statin including myalgia and arthralgia and nausea. Currently no chest pain, shortness of breath or other cardiovascular related symptoms noted.    Depression screen [facility] 2/9 03/28/2021 03/28/2021 10/25/2020  Decreased Interest 0 0 0  Down, Depressed, Hopeless 0 0 0  PHQ - 2 Score 0 0 0  Altered sleeping 1 - 1  Tired, decreased energy 0 - 1  Change in appetite 1 - 1  Feeling bad or failure about yourself  0 - 0  Trouble concentrating 0 - 0  Moving slowly or fidgety/restless 0 - 0  Suicidal thoughts 0 - 0  PHQ-9 Score 2 - 3  Difficult doing work/chores Not difficult at all - Not difficult at all  Some recent data might be hidden    History [place]ula has a past medical history of Allergic rhinitis, seasonal, Allergy, Anxiety, Cataract, Headache(784.0), Hyperlipidemia, Hypothyroidism, Left breast mass, Lung nodules, Osteopenia, PONV (postoperative nausea and vomiting), and Thyroid disease.   She has a past surgical history that includes Hysterotomy; Colonoscopy; Cataract  extraction w/PHACO (06/26/2011); Cataract extraction w/PHACO (07/06/2011); Breast lumpectomy with radioactive seed localization (Left, 12/06/2016); and Breast excisional biopsy (Left, 2018).   Her family history includes COPD in her brother; Diabetes in her sister; Heart disease in her mother; Hypertension in her mother and sister.She reports that she quit smoking about 47 years ago. Her smoking use included cigarettes. She has a 2.50 pack-year smoking history. She has never used smokeless tobacco. She reports that she does not currently use alcohol. She reports that she does not use drugs.    ROS Review of Systems  Constitutional: Negative.   HENT:  Negative for congestion.   Eyes:  Negative for visual disturbance.  Respiratory:  Negative for shortness of breath.   Cardiovascular:  Positive for leg swelling (occasional. Not associated with dyspnea). Negative for chest pain.  Gastrointestinal:  Negative for abdominal pain, constipation, diarrhea, nausea and vomiting.  Genitourinary:  Negative for difficulty urinating.  Musculoskeletal:  Negative for arthralgias and myalgias.  Neurological:  Negative for headaches.  Psychiatric/Behavioral:  Negative for sleep disturbance.    Objective:  BP 135/73    Pulse 78    Temp (!) 97 F (36.1 C)    Ht _0  (1.575 m)    Wt 143 lb 9.6 oz (65.1 kg)    SpO2 99%    BMI 26.26 kg/m   BP Readings from Last 3 Encounters:  03/28/21 135/73  10/25/20 130/76  07/30/20 136/81    Wt Readings from Last  3 Encounters:  03/28/21 143 lb 9.6 oz (65.1 kg)  10/25/20 149 lb 12.8 oz (67.9 kg)  07/30/20 151 lb 4 oz (68.6 kg)     Physical Exam Constitutional:      General: She is not in acute distress.    Appearance: She is well-developed.  HENT:     Head: Normocephalic and atraumatic.  Eyes:     Conjunctiva/sclera: Conjunctivae normal.     Pupils: Pupils are equal, round, and reactive to light.  Neck:     Thyroid: No thyromegaly.  Cardiovascular:     Rate  and Rhythm: Normal rate and regular rhythm.     Heart sounds: Normal heart sounds. No murmur heard. Pulmonary:     Effort: Pulmonary effort is normal. No respiratory distress.     Breath sounds: Normal breath sounds. No wheezing or rales.  Abdominal:     General: Bowel sounds are normal. There is no distension.     Palpations: Abdomen is soft.     Tenderness: There is no abdominal tenderness.  Musculoskeletal:        General: Normal range of motion.     Cervical back: Normal range of motion and neck supple.  Lymphadenopathy:     Cervical: No cervical adenopathy.  Skin:    General: Skin is warm and dry.  Neurological:     Mental Status: She is alert and oriented to person, place, and time.  Psychiatric:        Behavior: Behavior normal.        Thought Content: Thought content normal.        Judgment: Judgment normal.      Assessment & Plan:   Ellaina was seen today for pre-op exam.  Diagnoses and all orders for this visit:  Pre-op testing -     EKG 12-Lead -     CBC with Differential/Platelet -     CMP14+EGFR -     Lipid panel -     TSH + free T4  Hypothyroidism, unspecified type -     CBC with Differential/Platelet -     CMP14+EGFR -     Lipid panel -     TSH + free T4  Mixed hyperlipidemia -     CBC with Differential/Platelet -     CMP14+EGFR -     Lipid panel -     TSH + free T4  Edema, unspecified type -     CBC with Differential/Platelet -     CMP14+EGFR -     Lipid panel -     TSH + free T4  Other orders -     hydrochlorothiazide (MICROZIDE) 12.5 MG capsule; One daily as needed for swelling       I am having Alegria C. Schepers start on hydrochlorothiazide. I am also having her maintain her multivitamin with minerals, calcium citrate-vitamin D, trimethoprim, cholecalciferol, Synthroid, desvenlafaxine, mirtazapine, and pravastatin.  Allergies as of 03/28/2021       Reactions   Cholestatin    Tetanus Toxoids Swelling   Redness at site of injection    Sertraline Anxiety        Medication List        Accurate as of March 28, 2021  6:39 PM. If you have any questions, ask your nurse or doctor.          calcium citrate-vitamin D 315-200 MG-UNIT tablet Commonly known as: CITRACAL+D Take 2 tablets by mouth 2 (two) times daily.   cholecalciferol 25 MCG (  1000 UNIT) tablet Commonly known as: VITAMIN D3 Take 1,000 Units by mouth daily.   desvenlafaxine 100 MG 24 hr tablet Commonly known as: PRISTIQ Take 1 tablet (100 mg total) by mouth daily.   hydrochlorothiazide 12.5 MG capsule Commonly known as: MICROZIDE One daily as needed for swelling Started by: Claretta Fraise, MD   mirtazapine 15 MG tablet Commonly known as: REMERON Take 1 tablet (15 mg total) by mouth at bedtime. For sleep   multivitamin with minerals tablet Take 1 tablet by mouth daily.   pravastatin 40 MG tablet Commonly known as: PRAVACHOL Take 1 tablet (40 mg total) by mouth daily.   Synthroid 75 MCG tablet Generic drug: levothyroxine TAKE 1 TABLET (75 MCG TOTAL) BY MOUTH DAILY BEFORE BREAKFAST. NAME BRAND ONLY   trimethoprim 100 MG tablet Commonly known as: TRIMPEX Take 100 mg by mouth daily.       PT. Is cleared for surgery under general anesthesia with minimal risk. Continue all meds through perioperative period.   Follow-up: Return in about 6 months (around 09/25/2021).  Claretta Fraise, M.D.

## 2021-03-28 NOTE — Patient Instructions (Signed)

## 2021-03-29 LAB — CBC WITH DIFFERENTIAL/PLATELET
Basophils Absolute: 0 10*3/uL (ref 0.0–0.2)
Basos: 1 %
EOS (ABSOLUTE): 0.2 10*3/uL (ref 0.0–0.4)
Eos: 3 %
Hematocrit: 38 % (ref 34.0–46.6)
Hemoglobin: 12.9 g/dL (ref 11.1–15.9)
Immature Grans (Abs): 0 10*3/uL (ref 0.0–0.1)
Immature Granulocytes: 0 %
Lymphocytes Absolute: 1.9 10*3/uL (ref 0.7–3.1)
Lymphs: 31 %
MCH: 30.9 pg (ref 26.6–33.0)
MCHC: 33.9 g/dL (ref 31.5–35.7)
MCV: 91 fL (ref 79–97)
Monocytes Absolute: 0.5 10*3/uL (ref 0.1–0.9)
Monocytes: 8 %
Neutrophils Absolute: 3.5 10*3/uL (ref 1.4–7.0)
Neutrophils: 57 %
Platelets: 238 10*3/uL (ref 150–450)
RBC: 4.18 x10E6/uL (ref 3.77–5.28)
RDW: 12.5 % (ref 11.7–15.4)
WBC: 6.1 10*3/uL (ref 3.4–10.8)

## 2021-03-29 LAB — TSH+FREE T4
Free T4: 1.29 ng/dL (ref 0.82–1.77)
TSH: 0.543 u[IU]/mL (ref 0.450–4.500)

## 2021-03-29 LAB — CMP14+EGFR
ALT: 26 IU/L (ref 0–32)
AST: 32 IU/L (ref 0–40)
Albumin/Globulin Ratio: 1.6 (ref 1.2–2.2)
Albumin: 4.3 g/dL (ref 3.6–4.6)
Alkaline Phosphatase: 98 IU/L (ref 44–121)
BUN/Creatinine Ratio: 19 (ref 12–28)
BUN: 15 mg/dL (ref 8–27)
Bilirubin Total: 0.3 mg/dL (ref 0.0–1.2)
CO2: 27 mmol/L (ref 20–29)
Calcium: 9.7 mg/dL (ref 8.7–10.3)
Chloride: 100 mmol/L (ref 96–106)
Creatinine, Ser: 0.77 mg/dL (ref 0.57–1.00)
Globulin, Total: 2.7 g/dL (ref 1.5–4.5)
Glucose: 105 mg/dL — ABNORMAL HIGH (ref 70–99)
Potassium: 4.8 mmol/L (ref 3.5–5.2)
Sodium: 141 mmol/L (ref 134–144)
Total Protein: 7 g/dL (ref 6.0–8.5)
eGFR: 77 mL/min/{1.73_m2} (ref >59)

## 2021-03-29 LAB — LIPID PANEL
Chol/HDL Ratio: 3.1 ratio (ref 0.0–4.4)
Cholesterol, Total: 135 mg/dL (ref 100–199)
HDL: 44 mg/dL (ref >39)
LDL Chol Calc (NIH): 71 mg/dL (ref 0–99)
Triglycerides: 107 mg/dL (ref 0–149)
VLDL Cholesterol Cal: 20 mg/dL (ref 5–40)

## 2021-03-29 NOTE — Progress Notes (Signed)
Hello Milliana,  Your lab result is normal and/or stable.Some minor variations that are not significant are commonly marked abnormal, but do not represent any medical problem for you.  Best regards, Adley Mazurowski, M.D.

## 2021-04-04 HISTORY — PX: BUNIONECTOMY: SHX129

## 2021-04-15 ENCOUNTER — Other Ambulatory Visit: Payer: Self-pay

## 2021-04-15 ENCOUNTER — Ambulatory Visit: Payer: Medicare Other | Admitting: Podiatry

## 2021-04-15 DIAGNOSIS — M21621 Bunionette of right foot: Secondary | ICD-10-CM | POA: Diagnosis not present

## 2021-04-15 DIAGNOSIS — M216X1 Other acquired deformities of right foot: Secondary | ICD-10-CM

## 2021-04-15 NOTE — Patient Instructions (Signed)

## 2021-04-15 NOTE — Progress Notes (Signed)
Subjective: 81 year old female presents the office today for follow-up evaluation of continued callus, pain of the right foot submetatarsal 5.  She states the calluses come back and causing discomfort.  This has been ongoing for quite some time and is becoming more frequent and pain.  She also gets pain in the bottom but also at the side of the foot metatarsal head that she points to particular with pressure.  She has continued with multiple conservative treatments including.  Debridements, offloading, moisturizer with any symptom improvement.  Objective: AAO x3, NAD DP/PT pulses palpable bilaterally, CRT less than 3 seconds Prominent metatarsal heads plantarly particular the right fifth metatarsal resulted in a hyperkeratotic lesion.  There is no underlying ulceration drainage or any signs of infection.  Prominence of the tailor's bunion is also noted which causes discomfort with pressure.  No other areas of discomfort.  MMT 5/5. No pain with calf compression, swelling, warmth, erythema  Assessment: Tailor's bunion, recurrent callus  Plan: -All treatment options discussed with the patient including all alternatives, risks, complications.  -I reviewed x-rays from previous today.  Discussed with conservative possible surgical treatment options.  At this time she wants to proceed with surgical intervention.  Discussed with her exostectomy at the fifth metatarsal head both lateral and plantar.  Discussed risks and also including that this may not be helpful in transfer lesions.  She wants to proceed. -The incision placement as well as the postoperative course was discussed with the patient. I discussed risks of the surgery which include, but not limited to, infection, bleeding, pain, swelling, need for further surgery, delayed or nonhealing, painful or ugly scar, numbness or sensation changes, over/under correction, recurrence, transfer lesions, further deformity, DVT/PE, loss of toe/foot. Patient  understands these risks and wishes to proceed with surgery. The surgical consent was reviewed with the patient all 3 pages were signed. No promises or guarantees were given to the outcome of the procedure. All questions were answered to the best of my ability. Before the surgery the patient was encouraged to call the office if there is any further questions. The surgery will be performed at the [facility] on an outpatient basis. -Patient encouraged to call the office with any questions, concerns, change in symptoms.

## 2021-04-18 ENCOUNTER — Telehealth: Payer: Self-pay | Admitting: Urology

## 2021-04-18 NOTE — Telephone Encounter (Signed)
DOS - 04/27/21  [facility] RIGHT --- [phone]  [facility] EFFECTIVE DATE - 03/06/21  PLAN DEDUCTIBLE - $0.00 OUT OF POCKET - $3,600.00 W/ $3,586.16 REMAINING COINSURANCE - 0% COPAY - $295.00   PER [employer] WEBSITE FOR CPT CODE  38453 Notification or Prior Authorization is not required for the requested services  Decision ID #:[id number]

## 2021-04-27 ENCOUNTER — Other Ambulatory Visit: Payer: Self-pay | Admitting: Podiatry

## 2021-04-27 ENCOUNTER — Ambulatory Visit: Payer: Medicare Other | Admitting: Family Medicine

## 2021-04-27 ENCOUNTER — Encounter: Payer: Self-pay | Admitting: Podiatry

## 2021-04-27 ENCOUNTER — Telehealth: Payer: Self-pay | Admitting: *Deleted

## 2021-04-27 DIAGNOSIS — M21621 Bunionette of right foot: Secondary | ICD-10-CM | POA: Diagnosis not present

## 2021-04-27 DIAGNOSIS — M2011 Hallux valgus (acquired), right foot: Secondary | ICD-10-CM | POA: Diagnosis not present

## 2021-04-27 MED ORDER — HYDROCODONE-ACETAMINOPHEN 5-325 MG PO TABS: 1.0000 | ORAL_TABLET | Freq: Four times a day (QID) | ORAL | 0 refills | Status: DC | PRN

## 2021-04-27 MED ORDER — OXYCODONE-ACETAMINOPHEN 5-325 MG PO TABS: 1.0000 | ORAL_TABLET | Freq: Four times a day (QID) | ORAL | 0 refills | Status: DC | PRN

## 2021-04-27 MED ORDER — CEPHALEXIN 500 MG PO CAPS: 500.0000 mg | ORAL_CAPSULE | Freq: Three times a day (TID) | ORAL | 0 refills | Status: DC

## 2021-04-27 MED ORDER — PROMETHAZINE HCL 25 MG PO TABS: 25.0000 mg | ORAL_TABLET | Freq: Three times a day (TID) | ORAL | 0 refills | Status: DC | PRN

## 2021-04-27 NOTE — Progress Notes (Signed)
Post-op medication resent as vicodin was on backorder

## 2021-04-27 NOTE — Progress Notes (Signed)
Postop medications sent 

## 2021-04-27 NOTE — Telephone Encounter (Signed)
Patient is calling because her pharmacy has called and the pain medicine prescribed has been on back order for 3 weeks, can something else be sent? Please advise.

## 2021-04-27 NOTE — Telephone Encounter (Signed)
Patient has been notified that a new medication has been sent to her pharmacy, since the other one was on back order.

## 2021-04-28 ENCOUNTER — Telehealth: Payer: Self-pay | Admitting: *Deleted

## 2021-04-28 NOTE — Telephone Encounter (Signed)
Patient is calling to make sure she is supposed to wear the boot at all times, toes seem a little swollen, should she be putting the ice pak on actual surgical area or behind the knee?Please advise.

## 2021-05-02 ENCOUNTER — Ambulatory Visit (INDEPENDENT_AMBULATORY_CARE_PROVIDER_SITE_OTHER): Payer: Medicare Other

## 2021-05-02 ENCOUNTER — Other Ambulatory Visit: Payer: Self-pay

## 2021-05-02 ENCOUNTER — Ambulatory Visit (INDEPENDENT_AMBULATORY_CARE_PROVIDER_SITE_OTHER): Payer: Medicare Other | Admitting: Podiatry

## 2021-05-02 VITALS — BP 160/90 | HR 77 | Temp 98.4°F | Resp 18

## 2021-05-02 DIAGNOSIS — M21621 Bunionette of right foot: Secondary | ICD-10-CM

## 2021-05-02 DIAGNOSIS — Z9889 Other specified postprocedural states: Secondary | ICD-10-CM | POA: Diagnosis not present

## 2021-05-04 NOTE — Progress Notes (Signed)
Subjective: Cynthia Garrett is a 81 y.o. is seen today in office s/p right foot tailor's bunionectomy, exostectomy preformed on 04/27/2021.  Overall states that she is doing well not having significant pain.  She is still in the cam boot.  Denies any systemic complaints such as fevers, chills, nausea, vomiting. No calf pain, chest pain, shortness of breath.   Objective: General: No acute distress, AAOx3  DP/PT pulses palpable 2/4, CRT < 3 sec to all digits.  Protective sensation intact. Motor function intact.  Right foot: Incision is well coapted without any evidence of dehiscence with sutures intact. There is no surrounding erythema, ascending cellulitis, fluctuance, crepitus, malodor, drainage/purulence. There is mild edema around the surgical site. There is no significant pain along the surgical site.  No other areas of tenderness to bilateral lower extremities.  No other open lesions or pre-ulcerative lesions.  No pain with calf compression, swelling, warmth, erythema.   Assessment and Plan:  Status post right foot tailor's bunionectomy, exostectomy, doing well with no complications   -Treatment options discussed including all alternatives, risks, and complications -X-rays obtained reviewed.  No evidence of acute fracture noted. -Incision was cleaned.  Antibiotic ointment and a bandage applied.  Keep the dressing clean, dry, intact however she can wash the foot with soap and water dry thoroughly and apply a similar bandage if needed. -Continue cam boot for now -Ice/elevation -Pain medication as needed. -Monitor for any clinical signs or symptoms of infection and DVT/PE and directed to call the office immediately should any occur or go to the ER. -Follow-up as scheduled for suture removal or sooner if any problems arise. In the meantime, encouraged to call the office with any questions, concerns, change in symptoms.   Celesta Gentile, DPM

## 2021-05-08 ENCOUNTER — Other Ambulatory Visit: Payer: Self-pay | Admitting: Family Medicine

## 2021-05-08 DIAGNOSIS — E039 Hypothyroidism, unspecified: Secondary | ICD-10-CM

## 2021-05-12 ENCOUNTER — Ambulatory Visit (INDEPENDENT_AMBULATORY_CARE_PROVIDER_SITE_OTHER): Payer: Medicare Other | Admitting: Podiatry

## 2021-05-12 ENCOUNTER — Other Ambulatory Visit: Payer: Self-pay

## 2021-05-12 DIAGNOSIS — M21621 Bunionette of right foot: Secondary | ICD-10-CM

## 2021-05-12 DIAGNOSIS — Z9889 Other specified postprocedural states: Secondary | ICD-10-CM

## 2021-05-12 NOTE — Progress Notes (Signed)
Subjective: ?Cynthia Garrett is a 81 y.o. is seen today in office s/p right foot tailor's bunionectomy, exostectomy preformed on 04/27/2021.  Overall states that she is doing well not having significant pain. Presents today for suture removal. She is still walking in the CAM boot. Denies any systemic complaints such as fevers, chills, nausea, vomiting. No calf pain, chest pain, shortness of breath.  ? ?Objective: ?General: No acute distress, AAOx3  ?DP/PT pulses palpable 2/4, CRT < 3 sec to all digits.  ?Protective sensation intact. Motor function intact.  ?Right foot: Incision is well coapted without any evidence of dehiscence with sutures intact. There is no surrounding erythema, ascending cellulitis, fluctuance, crepitus, malodor, drainage/purulence. There is mild and improved edema around the surgical site. There is no pain along the surgical site.  ?No other areas of tenderness to bilateral lower extremities.  ?No other open lesions or pre-ulcerative lesions.  ?No pain with calf compression, swelling, warmth, erythema.  ? ?Assessment and Plan:  ?Status post right foot tailor's bunionectomy, exostectomy, doing well with no complications  ? ?-Treatment options discussed including all alternatives, risks, and complications ?-Suture removed without complications. Incision well coapted. Antibiotic ointment and bandage applied. She can start to was the foot with soap and water and apply similar bandage.  ?-Continue CAM boot. ?-Ice/elevate ?-She is to bring her shoe next appointment so we can offload the area to help prevent recurrence.  ?-Monitor for any clinical signs or symptoms of infection and directed to call the office immediately should any occur or go to the ER. ? ?RTC 2 weeks or sooner if needed ? ?Trula Slade DPM ?

## 2021-05-26 ENCOUNTER — Other Ambulatory Visit: Payer: Self-pay

## 2021-05-26 ENCOUNTER — Ambulatory Visit (INDEPENDENT_AMBULATORY_CARE_PROVIDER_SITE_OTHER): Payer: Medicare Other

## 2021-05-26 ENCOUNTER — Ambulatory Visit (INDEPENDENT_AMBULATORY_CARE_PROVIDER_SITE_OTHER): Payer: Medicare Other | Admitting: Podiatry

## 2021-05-26 DIAGNOSIS — M21621 Bunionette of right foot: Secondary | ICD-10-CM

## 2021-05-26 DIAGNOSIS — Z9889 Other specified postprocedural states: Secondary | ICD-10-CM

## 2021-05-26 DIAGNOSIS — M79674 Pain in right toe(s): Secondary | ICD-10-CM | POA: Diagnosis not present

## 2021-05-26 DIAGNOSIS — B351 Tinea unguium: Secondary | ICD-10-CM

## 2021-05-26 DIAGNOSIS — M79675 Pain in left toe(s): Secondary | ICD-10-CM

## 2021-05-28 NOTE — Progress Notes (Signed)
Subjective: ?Cynthia Garrett is a 81 y.o. is seen today in office s/p right foot tailor's bunionectomy, exostectomy preformed on 04/27/2021.  States that she is doing well and her pain level is 2/10.  She states that she still using the cam boot and she has discomfort she walks too much.  No injuries that she reports.  No swelling or redness or any drainage that she reports to me incision incisions healed well.  ? ?The nails be trimmed today as are thickened elongated she cannot trim herself.  There is no swelling or redness or any signs of infection of the toenail sites.  ? ?Objective: ?General: No acute distress, AAOx3  ?DP/PT pulses palpable 2/4, CRT < 3 sec to all digits.  ?Protective sensation intact. Motor function intact.  ?Right foot: Incision is well coapted without any evidence of dehiscence and scar is formed.  There is still some edema present mostly noted to the left fifth metatarsal head laterally.  There is no erythema or warmth.  No fluctuation or signs of infection.  Slight discomfort upon palpation  ?Nails are hypertrophic, dystrophic with yellow discoloration and elongated causing discomfort of nails 1-10 bilaterally.  No edema, erythema or signs of infection of the toenail sites.  Mild incurvation present in the nails. ?No other open lesions or pre-ulcerative lesions.  ?No pain with calf compression, swelling, warmth, erythema.  ? ?Assessment and Plan:  ?Status post right foot tailor's bunionectomy, exostectomy, doing well with no complications ; onychomycosis ? ?-Treatment options discussed including all alternatives, risks, and complications ?-X-rays obtained reviewed.  No subacute fracture.  Status post exostectomy fifth metatarsal head. ?-Discussed that she can start to transition to regular shoe as tolerated.  Continue ice, elevate as well as compression.  Dispensed offloading pads which she does get back into a regular shoe. ?-Sharply debrided the nails x10 without any complications or  bleeding. ? ?Return in about 4 weeks (around 06/23/2021). ? ?Trula Slade DPM ?

## 2021-06-14 ENCOUNTER — Ambulatory Visit (INDEPENDENT_AMBULATORY_CARE_PROVIDER_SITE_OTHER): Payer: Medicare Other | Admitting: Podiatry

## 2021-06-14 DIAGNOSIS — Z9889 Other specified postprocedural states: Secondary | ICD-10-CM

## 2021-06-14 DIAGNOSIS — M21621 Bunionette of right foot: Secondary | ICD-10-CM

## 2021-06-14 NOTE — Patient Instructions (Signed)

## 2021-06-15 NOTE — Progress Notes (Signed)
Subjective: ?MIHO Garrett is a 81 y.o. is seen today in office s/p right foot tailor's bunionectomy, exostectomy preformed on 04/27/2021.  She is back to her regular shoe.  She does not get any pain on the bottom of her right foot which should be callus previously.  She does get some tenderness on that side going to the lateral aspect of the PIP metatarsal head.  Still gets some swelling but seems to be improved.  No recent injuries otherwise.  No fevers or chills.  ? ?Objective: ?General: No acute distress, AAOx3  ?DP/PT pulses palpable 2/4, CRT < 3 sec to all digits.  ?Protective sensation intact. Motor function intact.  ?Right foot: Incision is well coapted without any evidence of dehiscence and scar is formed.  There is minimal edema still present most notably along the lateral aspect of fifth metatarsal head.  Incisions well-healed without any drainage or signs of cellulitis.  No tenderness submetatarsal area.   ?No pain with calf compression, swelling, warmth, erythema.  ? ?Assessment and Plan:  ?Status post right foot tailor's bunionectomy, exostectomy ? ?-Treatment options discussed including all alternatives, risks, and complications ?-She is back to regular shoe.  Still some swelling present but appears to be improved compared to last appointment when I saw her.  I dispensed an offloading pad.  Discussed gradual increase activity level as tolerated.  Continue to ice and elevate as well as compression of the postoperative edema. ? ?Return in about 6 weeks (around 07/26/2021). ? ?Trula Slade DPM ?

## 2021-06-25 ENCOUNTER — Other Ambulatory Visit: Payer: Self-pay | Admitting: Family Medicine

## 2021-07-01 ENCOUNTER — Ambulatory Visit: Payer: Medicare Other

## 2021-07-05 ENCOUNTER — Ambulatory Visit (INDEPENDENT_AMBULATORY_CARE_PROVIDER_SITE_OTHER): Payer: Medicare Other

## 2021-07-05 VITALS — Wt 136.0 lb

## 2021-07-05 DIAGNOSIS — Z0000 Encounter for general adult medical examination without abnormal findings: Secondary | ICD-10-CM | POA: Diagnosis not present

## 2021-07-05 NOTE — Progress Notes (Signed)
? ?Subjective:  ? Cynthia Garrett is a 81 y.o. female who presents for Medicare Annual (Subsequent) preventive examination. ? ?Virtual Visit via Telephone Note ? ?I connected with  Cynthia Garrett on 07/05/21 at  2:45 PM EDT by telephone and verified that I am speaking with the correct person using two identifiers. ? ?Location: ?Patient: Home ?Provider: [facility] ?Persons participating in the virtual visit: patient/Nurse Health Advisor ?  ?I discussed the limitations, risks, security and privacy concerns of performing an evaluation and management service by telephone and the availability of in person appointments. The patient expressed understanding and agreed to proceed. ? ?Interactive audio and video telecommunications were attempted between this nurse and patient, however failed, due to patient having technical difficulties OR patient did not have access to video capability.  We continued and completed visit with audio only. ? ?Some vital signs may be absent or patient reported.  ? ?Amy Dionne Ano, LPN  ? ?Review of Systems    ? ?Cardiac Risk Factors include: advanced age (>88mn, >>108women);dyslipidemia ? ?   ?Objective:  ?  ?Today's Vitals  ? 07/05/21 1446  ?Weight: 136 lb (61.7 kg)  ? ?Body mass index is 24.87 kg/m?. ? ? ?  07/05/2021  ?  2:51 PM 06/30/2020  ? 11:03 AM 06/18/2019  ?  8:39 AM 12/06/2016  ? 12:05 PM 11/29/2016  ?  4:20 PM 06/20/2011  ? 11:11 AM  ?Advanced Directives  ?Does Patient Have a Medical Advance Directive? _0  Patient has advance directive, copy not in chart  ?Type of AParamedicof ARolling MeadowsLiving will HLeeLiving will Living will HJeffersonLiving will Living will;Healthcare Power of ABellamy ?Does patient want to make changes to medical advance directive?   No - Patient declined No - Patient declined    ?Copy of HLenain Chart? No - copy requested No - copy  requested  No - copy requested  Copy requested from family  ?Pre-existing out of facility DNR order (yellow form or pink MOST form)      No  ? ? ?Current Medications (verified) ?Outpatient Encounter Medications as of 07/05/2021  ?Medication Sig  ? calcium citrate-vitamin D (CITRACAL+D) 315-200 MG-UNIT per tablet Take 2 tablets by mouth 2 (two) times daily.  ? cholecalciferol (VITAMIN D3) 25 MCG (1000 UNIT) tablet Take 1,000 Units by mouth daily.  ? desvenlafaxine (PRISTIQ) 100 MG 24 hr tablet Take 1 tablet (100 mg total) by mouth daily.  ? hydrochlorothiazide (MICROZIDE) 12.5 MG capsule TAKE ONE CAPSULE DAILY AS NEEDED FOR SWELLING  ? mirtazapine (REMERON) 15 MG tablet Take 1 tablet (15 mg total) by mouth at bedtime. For sleep  ? Multiple Vitamins-Minerals (MULTIVITAMIN WITH MINERALS) tablet Take 1 tablet by mouth daily.  ? pravastatin (PRAVACHOL) 40 MG tablet Take 1 tablet (40 mg total) by mouth daily.  ? SYNTHROID 75 MCG tablet TAKE 1 TABLET (75 MCG TOTAL) BY MOUTH DAILY BEFORE BREAKFAST. NAME BRAND ONLY  ? tretinoin (RETIN-A) 0.025 % cream Apply topically at bedtime as needed.  ? trimethoprim (TRIMPEX) 100 MG tablet Take 100 mg by mouth daily.  ? [DISCONTINUED] cephALEXin (KEFLEX) 500 MG capsule Take 1 capsule (500 mg total) by mouth 3 (three) times daily.  ? [DISCONTINUED] oxyCODONE-acetaminophen (PERCOCET/ROXICET) 5-325 MG tablet Take 1 tablet by mouth every 6 (six) hours as needed for severe pain.  ? [DISCONTINUED] promethazine (PHENERGAN) 25 MG tablet Take 1 tablet (  25 mg total) by mouth every 8 (eight) hours as needed for nausea or vomiting.  ? ?No facility-administered encounter medications on file as of 07/05/2021.  ? ? ?Allergies (verified) ?Cholestatin, Tetanus toxoids, and Sertraline  ? ?History: ?Past Medical History:  ?Diagnosis Date  ? Allergic rhinitis, seasonal   ? Allergy   ? Anxiety   ? Cataract   ? extraction  ? Headache(784.0)   ? Hyperlipidemia   ? Hypothyroidism   ? Left breast mass   ? Lung  nodules   ? Osteopenia   ? PONV (postoperative nausea and vomiting)   ? Thyroid disease   ? ?Past Surgical History:  ?Procedure Laterality Date  ? BREAST EXCISIONAL BIOPSY Left 2018  ? BREAST LUMPECTOMY WITH RADIOACTIVE SEED LOCALIZATION Left 12/06/2016  ? Procedure: LEFT BREAST LUMPECTOMY WITH RADIOACTIVE SEED LOCALIZATION;  Surgeon: Fanny Skates, MD;  Location: [place];  Service: General;  Laterality: Left;  ? BUNIONECTOMY Right 04/04/2021  ? 5th toe  ? CATARACT EXTRACTION W/PHACO  06/26/2011  ? Procedure: CATARACT EXTRACTION PHACO AND INTRAOCULAR LENS PLACEMENT (IOC);  Surgeon: Tonny Branch, MD;  Location: [facility];  Service: Ophthalmology;  Laterality: Right;  CDE 10.34  ? CATARACT EXTRACTION W/PHACO  07/06/2011  ? Procedure: CATARACT EXTRACTION PHACO AND INTRAOCULAR LENS PLACEMENT (IOC);  Surgeon: Tonny Branch, MD;  Location: [facility];  Service: Ophthalmology;  Laterality: Left;  CDE:9.92  ? COLONOSCOPY    ? Hysterotomy    ? fibroid tumor  ? ?Family History  ?Problem Relation Age of Onset  ? Heart disease Mother   ? Hypertension Mother   ? Diabetes Sister   ? Hypertension Sister   ? COPD Brother   ? Anesthesia problems Neg Hx   ? Hypotension Neg Hx   ? Malignant hyperthermia Neg Hx   ? Pseudochol deficiency Neg Hx   ? Colon cancer Neg Hx   ? Cancer Neg Hx   ? Breast cancer Neg Hx   ? ?Social History  ? ?Socioeconomic History  ? Marital status: Married  ?  Spouse name: Delfino Lovett  ? Number of children: 2  ? Years of education: Not on file  ? Highest education level: Not on file  ?Occupational History  ?  Employer: RETIRED  ?Tobacco Use  ? Smoking status: Former  ?  Packs/day: 0.25  ?  Years: 10.00  ?  Pack years: 2.50  ?  Types: Cigarettes  ?  Quit date: 06/07/1973  ?  Years since quitting: 48.1  ? Smokeless tobacco: Never  ? Tobacco comments:  ?  Significant second-hand exposure.  ?Vaping Use  ? Vaping Use: Never used  ?Substance and Sexual Activity  ? Alcohol use: Not Currently  ?  Alcohol/week: 0.0  standard drinks  ?  Comment: social  ? Drug use: No  ? Sexual activity: Yes  ?  Birth control/protection: Post-menopausal  ?Other Topics Concern  ? Not on file  ?Social History Narrative  ? Originally from [facility]. Always lived in [place]. Previously has traveled from [facility] to [place], [place], [place], [place], [place], [place], & [place]. Has dog. No bird exposure. Had bats in her attic previously that have been removed. No mold or hot tub exposure. Previously worked as an Agricultural consultant in a Restaurant manager, fast food. Previously enjoyed [place]. She has been doing Tai Chi.   ? ?Social Determinants of Health  ? ?Financial Resource Strain: Low Risk   ? Difficulty of Paying Living Expenses: Not hard at all  ?Food Insecurity:  No Food Insecurity  ? Worried About Charity fundraiser in the Last Year: Never true  ? Ran Out of Food in the Last Year: Never true  ?Transportation Needs: No Transportation Needs  ? Lack of Transportation (Medical): No  ? Lack of Transportation (Non-Medical): No  ?Physical Activity: Insufficiently Active  ? Days of Exercise per Week: 4 days  ? Minutes of Exercise per Session: 30 min  ?Stress: No Stress Concern Present  ? Feeling of Stress : Not at all  ?Social Connections: Socially Integrated  ? Frequency of Communication with Friends and Family: More than three times a week  ? Frequency of Social Gatherings with Friends and Family: More than three times a week  ? Attends Religious Services: More than 4 times per year  ? Active Member of Clubs or Organizations: Yes  ? Attends Archivist Meetings: More than 4 times per year  ? Marital Status: Married  ? ? ?Tobacco Counseling ?Counseling given: Not Answered ?Tobacco comments: Significant second-hand exposure. ? ? ?Clinical Intake: ? ?Pre-visit preparation completed: Yes ? ?Pain : No/denies pain ? ?  ? ?BMI - recorded: 24.87 ?Nutritional Status: BMI of 19-24  Normal ?Nutritional Risks: None ?Diabetes: No ? ?How often do you need to have someone help you when you  read instructions, pamphlets, or other written materials from your doctor or pharmacy?: 1 - Never ? ?Diabetic? no ? ?Interpreter Needed?: No ? ?Information entered by :: Amy Hopkins, LPN ? ? ?Activities of Daily Nicoletta Dress

## 2021-07-05 NOTE — Patient Instructions (Signed)
Cynthia Garrett , ?Thank you for taking time to come for your Medicare Wellness Visit. I appreciate your ongoing commitment to your health goals. Please review the following plan we discussed and let me know if I can assist you in the future.  ? ?Screening recommendations/referrals: ?Colonoscopy: Done 06/30/2015 - no repeat required ?Mammogram: Done 11/15/2020 - Repeat annually ?Bone Density: Done 01/06/2019 - Repeat every 2 years *due at next visit ?Recommended yearly ophthalmology/optometry visit for glaucoma screening and checkup ?Recommended yearly dental visit for hygiene and checkup ? ?Vaccinations: ?Influenza vaccine: Done 12/08/2020 - Repeat annually ?Pneumococcal vaccine: Done 2007, 2016, & 06/16/2015  ?Tdap vaccine: Done 2012 - Repeat in 10 years *due at next visit ?Shingles vaccine: Done 08/16/2020 & 11/18/2020 ?Covid-19:Done 04/08/2019, 05/06/2019, & 12/29/2019 ? ?Advanced directives: Please bring a copy of your health care power of attorney and living will to the office to be added to your chart at your convenience.  ? ?Conditions/risks identified: Aim for 30 minutes of exercise or brisk walking, 6-8 glasses of water, and 5 servings of fruits and vegetables each day.  ? ?Next appointment: Follow up in one year for your annual wellness visit  ? ? ?Preventive Care 8 Years and Older, Female ?Preventive care refers to lifestyle choices and visits with your health care provider that can promote health and wellness. ?What does preventive care include? ?A yearly physical exam. This is also called an annual well check. ?Dental exams once or twice a year. ?Routine eye exams. Ask your health care provider how often you should have your eyes checked. ?Personal lifestyle choices, including: ?Daily care of your teeth and gums. ?Regular physical activity. ?Eating a healthy diet. ?Avoiding tobacco and drug use. ?Limiting alcohol use. ?Practicing safe sex. ?Taking low-dose aspirin every day. ?Taking vitamin and mineral supplements as  recommended by your health care provider. ?What happens during an annual well check? ?The services and screenings done by your health care provider during your annual well check will depend on your age, overall health, lifestyle risk factors, and family history of disease. ?Counseling  ?Your health care provider may ask you questions about your: ?Alcohol use. ?Tobacco use. ?Drug use. ?Emotional well-being. ?Home and relationship well-being. ?Sexual activity. ?Eating habits. ?History of falls. ?Memory and ability to understand (cognition). ?Work and work Statistician. ?Reproductive health. ?Screening  ?You may have the following tests or measurements: ?Height, weight, and BMI. ?Blood pressure. ?Lipid and cholesterol levels. These may be checked every 5 years, or more frequently if you are over 36 years old. ?Skin check. ?Lung cancer screening. You may have this screening every year starting at age 55 if you have a 30-pack-year history of smoking and currently smoke or have quit within the past 15 years. ?Fecal occult blood test (FOBT) of the stool. You may have this test every year starting at age 45. ?Flexible sigmoidoscopy or colonoscopy. You may have a sigmoidoscopy every 5 years or a colonoscopy every 10 years starting at age 72. ?Hepatitis C blood test. ?Hepatitis B blood test. ?Sexually transmitted disease (STD) testing. ?Diabetes screening. This is done by checking your blood sugar (glucose) after you have not eaten for a while (fasting). You may have this done every 1-3 years. ?Bone density scan. This is done to screen for osteoporosis. You may have this done starting at age 40. ?Mammogram. This may be done every 1-2 years. Talk to your health care provider about how often you should have regular mammograms. ?Talk with your health care provider about your test  results, treatment options, and if necessary, the need for more tests. ?Vaccines  ?Your health care provider may recommend certain vaccines, such  as: ?Influenza vaccine. This is recommended every year. ?Tetanus, diphtheria, and acellular pertussis (Tdap, Td) vaccine. You may need a Td booster every 10 years. ?Zoster vaccine. You may need this after age 67. ?Pneumococcal 13-valent conjugate (PCV13) vaccine. One dose is recommended after age 63. ?Pneumococcal polysaccharide (PPSV23) vaccine. One dose is recommended after age 41. ?Talk to your health care provider about which screenings and vaccines you need and how often you need them. ?This information is not intended to replace advice given to you by your health care provider. Make sure you discuss any questions you have with your health care provider. ?Document Released: 03/19/2015 Document Revised: 11/10/2015 Document Reviewed: 12/22/2014 ?Elsevier Interactive Patient Education ? 2017 [employer]. ? ?Fall Prevention in the Home ?Falls can cause injuries. They can happen to people of all ages. There are many things you can do to make your home safe and to help prevent falls. ?What can I do on the outside of my home? ?Regularly fix the edges of walkways and driveways and fix any cracks. ?Remove anything that might make you trip as you walk through a door, such as a raised step or threshold. ?Trim any bushes or trees on the path to your home. ?Use bright outdoor lighting. ?Clear any walking paths of anything that might make someone trip, such as rocks or tools. ?Regularly check to see if handrails are loose or broken. Make sure that both sides of any steps have handrails. ?Any raised decks and porches should have guardrails on the edges. ?Have any leaves, snow, or ice cleared regularly. ?Use sand or salt on walking paths during winter. ?Clean up any spills in your garage right away. This includes oil or grease spills. ?What can I do in the bathroom? ?Use night lights. ?Install grab bars by the toilet and in the tub and shower. Do not use towel bars as grab bars. ?Use non-skid mats or decals in the tub or  shower. ?If you need to sit down in the shower, use a plastic, non-slip stool. ?Keep the floor dry. Clean up any water that spills on the floor as soon as it happens. ?Remove soap buildup in the tub or shower regularly. ?Attach bath mats securely with double-sided non-slip rug tape. ?Do not have throw rugs and other things on the floor that can make you trip. ?What can I do in the bedroom? ?Use night lights. ?Make sure that you have a light by your bed that is easy to reach. ?Do not use any sheets or blankets that are too big for your bed. They should not hang down onto the floor. ?Have a firm chair that has side arms. You can use this for support while you get dressed. ?Do not have throw rugs and other things on the floor that can make you trip. ?What can I do in the kitchen? ?Clean up any spills right away. ?Avoid walking on wet floors. ?Keep items that you use a lot in easy-to-reach places. ?If you need to reach something above you, use a strong step stool that has a grab bar. ?Keep electrical cords out of the way. ?Do not use floor polish or wax that makes floors slippery. If you must use wax, use non-skid floor wax. ?Do not have throw rugs and other things on the floor that can make you trip. ?What can I do with my stairs? ?  Do not leave any items on the stairs. ?Make sure that there are handrails on both sides of the stairs and use them. Fix handrails that are broken or loose. Make sure that handrails are as long as the stairways. ?Check any carpeting to make sure that it is firmly attached to the stairs. Fix any carpet that is loose or worn. ?Avoid having throw rugs at the top or bottom of the stairs. If you do have throw rugs, attach them to the floor with carpet tape. ?Make sure that you have a light switch at the top of the stairs and the bottom of the stairs. If you do not have them, ask someone to add them for you. ?What else can I do to help prevent falls? ?Wear shoes that: ?Do not have high heels. ?Have  rubber bottoms. ?Are comfortable and fit you well. ?Are closed at the toe. Do not wear sandals. ?If you use a stepladder: ?Make sure that it is fully opened. Do not climb a closed stepladder. ?Make sure that

## 2021-07-13 DIAGNOSIS — N390 Urinary tract infection, site not specified: Secondary | ICD-10-CM | POA: Diagnosis not present

## 2021-07-13 DIAGNOSIS — R350 Frequency of micturition: Secondary | ICD-10-CM | POA: Diagnosis not present

## 2021-08-02 ENCOUNTER — Ambulatory Visit (INDEPENDENT_AMBULATORY_CARE_PROVIDER_SITE_OTHER): Payer: Medicare Other | Admitting: Podiatry

## 2021-08-02 ENCOUNTER — Ambulatory Visit (INDEPENDENT_AMBULATORY_CARE_PROVIDER_SITE_OTHER): Payer: Medicare Other

## 2021-08-02 DIAGNOSIS — Z9889 Other specified postprocedural states: Secondary | ICD-10-CM

## 2021-08-02 DIAGNOSIS — M21621 Bunionette of right foot: Secondary | ICD-10-CM

## 2021-08-09 NOTE — Progress Notes (Signed)
Subjective: Cynthia Garrett is a 81 y.o. is seen today in office s/p right foot tailor's bunionectomy, exostectomy preformed on 04/27/2021.  She states that the area in the bottom that she was having pain with prior to surgery has resolved and not having any issues.  She does get some pain along the incision site itself but seems to be improving.  No recent injury or changes.  She is back in regular shoes.  No new concerns.   Objective: General: No acute distress, AAOx3  DP/PT pulses palpable 2/4, CRT < 3 sec to all digits.  Protective sensation intact. Motor function intact.  Right foot: Incision is well coapted without any evidence of dehiscence and scar is formed.  There is no pain or callus formation submetatarsal 5.  Some mild discomfort along the incision itself with trace edema.  There is no erythema or warmth.  No obvious signs of infection.  No pain with calf compression, swelling, warmth, erythema.   Assessment and Plan:  Status post right foot tailor's bunionectomy, exostectomy  -Treatment options discussed including all alternatives, risks, and complications -She continues to wear regular shoe.  Continue offloading pads for the tailor's bunion which is dispensed again today.  The symptoms that she was experiencing prior to surgery have resolved and I think her symptoms are more just related to the incision itself and scar tissue.  Continue moisturizer on the incision daily.  Increase activity level as tolerated.  At this point working discharge and postoperative care and she agrees with this plan she is to contact us any questions or concerns or any changes.  Trula Slade DPM

## 2021-09-07 ENCOUNTER — Encounter: Payer: Self-pay | Admitting: Nurse Practitioner

## 2021-09-07 ENCOUNTER — Ambulatory Visit (INDEPENDENT_AMBULATORY_CARE_PROVIDER_SITE_OTHER): Payer: Medicare Other | Admitting: Nurse Practitioner

## 2021-09-07 ENCOUNTER — Ambulatory Visit (INDEPENDENT_AMBULATORY_CARE_PROVIDER_SITE_OTHER): Payer: Medicare Other

## 2021-09-07 VITALS — BP 118/72 | HR 72 | Temp 98.8°F | Ht 62.0 in | Wt 137.0 lb

## 2021-09-07 DIAGNOSIS — M5136 Other intervertebral disc degeneration, lumbar region: Secondary | ICD-10-CM | POA: Diagnosis not present

## 2021-09-07 DIAGNOSIS — M549 Dorsalgia, unspecified: Secondary | ICD-10-CM

## 2021-09-07 DIAGNOSIS — M5430 Sciatica, unspecified side: Secondary | ICD-10-CM

## 2021-09-07 DIAGNOSIS — M545 Low back pain, unspecified: Secondary | ICD-10-CM | POA: Diagnosis not present

## 2021-09-07 MED ORDER — METHOCARBAMOL 500 MG PO TABS: 500.0000 mg | ORAL_TABLET | Freq: Four times a day (QID) | ORAL | 1 refills | Status: DC

## 2021-09-07 NOTE — Progress Notes (Signed)
Acute Office Visit  Subjective:     Patient ID: Cynthia Garrett, female    DOB: 10-14-1940, 81 y.o.   MRN: [id number]  Chief Complaint  Patient presents with   Back Pain    Lower back pain , crampy feeling that has moved down her back and into both hips    Back Pain Pertinent negatives include no fever or weight loss.   Patient is in today for Back Pain  She reports new onset back pain. There was not an injury that may have caused the pain. The most recent episode started last night and is worsening. The pain is located in the left lumbar areato the left thigh, to the right thigh. It is described as aching and soreness, is 5/10 in intensity, occurring constantly. Symptoms are worse in the: morning, mid-day, afternoon  Aggravating factors: walking Relieving factors: sitting.  She has tried application of heat, application of ice, and acetaminophen with no relief.   Associated symptoms: No abdominal pain No bowel incontinence  No chest pain No dysuria   No fever No headaches  No joint pains No pelvic pain  No weakness in leg  No tingling in lower extremities  No urinary incontinence No weight loss    -----------------------------------------------------------------------------------------   Review of Systems  Constitutional: Negative.  Negative for chills, fever, malaise/fatigue and weight loss.  HENT: Negative.    Eyes: Negative.   Respiratory: Negative.    Cardiovascular: Negative.   Genitourinary: Negative.   Musculoskeletal:  Positive for back pain.  Skin: Negative.  Negative for itching.  All other systems reviewed and are negative.       Objective:    BP 118/72   Pulse 72   Temp 98.8 F (37.1 C)   Ht '5\' 2"'$  (1.575 m)   Wt 137 lb (62.1 kg)   SpO2 99%   BMI 25.06 kg/m  BP Readings from Last 3 Encounters:  09/07/21 118/72  05/02/21 (!) 160/90  03/28/21 135/73   Wt Readings from Last 3 Encounters:  09/07/21 137 lb (62.1 kg)  07/05/21 136 lb (61.7  kg)  03/28/21 143 lb 9.6 oz (65.1 kg)      Physical Exam Vitals and nursing note reviewed.  Constitutional:      Appearance: Normal appearance.  HENT:     Head: Normocephalic.     Right Ear: External ear normal.     Left Ear: External ear normal.     Nose: Nose normal.  Eyes:     Conjunctiva/sclera: Conjunctivae normal.  Cardiovascular:     Rate and Rhythm: Normal rate and regular rhythm.     Pulses: Normal pulses.     Heart sounds: Normal heart sounds.  Pulmonary:     Effort: Pulmonary effort is normal.     Breath sounds: Normal breath sounds.  Abdominal:     General: Bowel sounds are normal.  Musculoskeletal:     Lumbar back: Tenderness present. No swelling. Decreased range of motion.  Skin:    General: Skin is dry.     Findings: No rash.  Neurological:     Mental Status: She is alert and oriented to person, place, and time.     No results found for any visits on 09/07/21.      Assessment & Plan:  Patient presents with acute lower back pain symptoms present in the past 24 to 48 hours.  Symptoms are not well controlled.  Started patient on Robaxin 500 mg tablet by mouth 4 times  daily as needed.  Apply warm compress as tolerated.  Tylenol Extra Strength as needed for pain.  Completed lumbar x-ray results pending.  Follow-up with worsening or unresolved symptoms. Problem List Items Addressed This Visit   None Visit Diagnoses     Back pain with sciatica    -  Primary   Relevant Medications   methocarbamol (ROBAXIN) 500 MG tablet   Other Relevant Orders   DG Lumbar Spine 2-3 Views       Meds ordered this encounter  Medications   methocarbamol (ROBAXIN) 500 MG tablet    Sig: Take 1 tablet (500 mg total) by mouth 4 (four) times daily.    Dispense:  30 tablet    Refill:  1    Order Specific Question:   Supervising Provider    Answer:   Claretta Fraise [id number]    Return if symptoms worsen or fail to improve, for back pain.  Ivy Lynn, NP

## 2021-09-07 NOTE — Patient Instructions (Signed)
Acute Back Pain, Adult Acute back pain is sudden and usually short-lived. It is often caused by an injury to the muscles and tissues in the back. The injury may result from: A muscle, tendon, or ligament getting overstretched or torn. Ligaments are tissues that connect bones to each other. Lifting something improperly can cause a back strain. Wear and tear (degeneration) of the spinal disks. Spinal disks are circular tissue that provide cushioning between the bones of the spine (vertebrae). Twisting motions, such as while playing sports or doing yard work. A hit to the back. Arthritis. You may have a physical exam, lab tests, and imaging tests to find the cause of your pain. Acute back pain usually goes away with rest and home care. Follow these instructions at home: Managing pain, stiffness, and swelling Take over-the-counter and prescription medicines only as told by your health care provider. Treatment may include medicines for pain and inflammation that are taken by mouth or applied to the skin, or muscle relaxants. Your health care provider may recommend applying ice during the first 24-48 hours after your pain starts. To do this: Put ice in a plastic bag. Place a towel between your skin and the bag. Leave the ice on for 20 minutes, 2-3 times a day. Remove the ice if your skin turns bright red. This is very important. If you cannot feel pain, heat, or cold, you have a greater risk of damage to the area. If directed, apply heat to the affected area as often as told by your health care provider. Use the heat source that your health care provider recommends, such as a moist heat pack or a heating pad. Place a towel between your skin and the heat source. Leave the heat on for 20-30 minutes. Remove the heat if your skin turns bright red. This is especially important if you are unable to feel pain, heat, or cold. You have a greater risk of getting burned. Activity  Do not stay in bed. Staying in  bed for more than 1-2 days can delay your recovery. Sit up and stand up straight. Avoid leaning forward when you sit or hunching over when you stand. If you work at a desk, sit close to it so you do not need to lean over. Keep your chin tucked in. Keep your neck drawn back, and keep your elbows bent at a 90-degree angle (right angle). Sit high and close to the steering wheel when you drive. Add lower back (lumbar) support to your car seat, if needed. Take short walks on even surfaces as soon as you are able. Try to increase the length of time you walk each day. Do not sit, drive, or stand in one place for more than 30 minutes at a time. Sitting or standing for long periods of time can put stress on your back. Do not drive or use heavy machinery while taking prescription pain medicine. Use proper lifting techniques. When you bend and lift, use positions that put less stress on your back: Bend your knees. Keep the load close to your body. Avoid twisting. Exercise regularly as told by your health care provider. Exercising helps your back heal faster and helps prevent back injuries by keeping muscles strong and flexible. Work with a physical therapist to make a safe exercise program, as recommended by your health care provider. Do any exercises as told by your physical therapist. Lifestyle Maintain a healthy weight. Extra weight puts stress on your back and makes it difficult to have good   posture. Avoid activities or situations that make you feel anxious or stressed. Stress and anxiety increase muscle tension and can make back pain worse. Learn ways to manage anxiety and stress, such as through exercise. General instructions Sleep on a firm mattress in a comfortable position. Try lying on your side with your knees slightly bent. If you lie on your back, put a pillow under your knees. Keep your head and neck in a straight line with your spine (neutral position) when using electronic equipment like  smartphones or pads. To do this: Raise your smartphone or pad to look at it instead of bending your head or neck to look down. Put the smartphone or pad at the level of your face while looking at the screen. Follow your treatment plan as told by your health care provider. This may include: Cognitive or behavioral therapy. Acupuncture or massage therapy. Meditation or yoga. Contact a health care provider if: You have pain that is not relieved with rest or medicine. You have increasing pain going down into your legs or buttocks. Your pain does not improve after 2 weeks. You have pain at night. You lose weight without trying. You have a fever or chills. You develop nausea or vomiting. You develop abdominal pain. Get help right away if: You develop new bowel or bladder control problems. You have unusual weakness or numbness in your arms or legs. You feel faint. These symptoms may represent a serious problem that is an emergency. Do not wait to see if the symptoms will go away. Get medical help right away. Call your local emergency services (911 in the [place] Do not drive yourself to the hospital. Summary Acute back pain is sudden and usually short-lived. Use proper lifting techniques. When you bend and lift, use positions that put less stress on your back. Take over-the-counter and prescription medicines only as told by your health care provider, and apply heat or ice as told. This information is not intended to replace advice given to you by your health care provider. Make sure you discuss any questions you have with your health care provider. Document Revised: 05/14/2020 Document Reviewed: 05/14/2020 Elsevier Patient Education  2023 [employer] Inc[employer]  

## 2021-09-25 ENCOUNTER — Other Ambulatory Visit: Payer: Self-pay | Admitting: Family Medicine

## 2021-09-26 ENCOUNTER — Encounter: Payer: Self-pay | Admitting: Family Medicine

## 2021-09-26 ENCOUNTER — Ambulatory Visit (INDEPENDENT_AMBULATORY_CARE_PROVIDER_SITE_OTHER): Payer: Medicare Other | Admitting: Family Medicine

## 2021-09-26 VITALS — BP 129/70 | HR 68 | Temp 97.1°F | Ht 62.0 in | Wt 138.6 lb

## 2021-09-26 DIAGNOSIS — E039 Hypothyroidism, unspecified: Secondary | ICD-10-CM | POA: Diagnosis not present

## 2021-09-26 DIAGNOSIS — E782 Mixed hyperlipidemia: Secondary | ICD-10-CM | POA: Diagnosis not present

## 2021-09-26 NOTE — Progress Notes (Signed)
Subjective:  Patient ID: Cynthia Garrett, female    DOB: 1940-10-18  Age: 81 y.o. MRN: [id number]  CC: Medical Management of Chronic Issues   HPI Cynthia Garrett presents for follow-up of elevated cholesterol. Doing well without complaints on current medication. Denies side effects of statin including myalgia and arthralgia and nausea. Also in today for liver function testing. Currently no chest pain, shortness of breath or other cardiovascular related symptoms noted.   follow-up on  thyroid. The patient has a history of hypothyroidism for many years. It has been stable recently. Pt. denies any change in  voice, loss of hair, heat or cold intolerance. Energy level has been adequate to good. Patient denies constipation and diarrhea. No myxedema. Medication is as noted below. Verified that pt is taking it daily on an empty stomach. Well tolerated.     09/26/2021    9:02 AM 09/26/2021    8:44 AM 09/07/2021    9:30 AM 07/05/2021    2:50 PM 03/28/2021    9:07 AM  Depression screen PHQ 2/9  Decreased Interest 0 0 0 0 0  Down, Depressed, Hopeless 0 0 0 0 0  PHQ - 2 Score 0 0 0 0 0  Altered sleeping 0    1  Tired, decreased energy 1    0  Change in appetite 0    1  Feeling bad or failure about yourself  0    0  Trouble concentrating 0    0  Moving slowly or fidgety/restless 0    0  Suicidal thoughts 0    0  PHQ-9 Score 1    2  Difficult doing work/chores Not difficult at all    Not difficult at all     History Cynthia Garrett has a past medical history of Allergic rhinitis, seasonal, Allergy, Anxiety, Cataract, Headache(784.0), Hyperlipidemia, Hypothyroidism, Left breast mass, Lung nodules, Osteopenia, PONV (postoperative nausea and vomiting), and Thyroid disease.   She has a past surgical history that includes Hysterotomy; Colonoscopy; Cataract extraction w/PHACO (06/26/2011); Cataract extraction w/PHACO (07/06/2011); Breast lumpectomy with radioactive seed localization (Left, 12/06/2016); Breast  excisional biopsy (Left, 2018); and Bunionectomy (Right, 04/04/2021).   Her family history includes COPD in her brother; Diabetes in her sister; Heart disease in her mother; Hypertension in her mother and sister.She reports that she quit smoking about 48 years ago. Her smoking use included cigarettes. She has a 2.50 pack-year smoking history. She has never used smokeless tobacco. She reports that she does not currently use alcohol. She reports that she does not use drugs.  Current Outpatient Medications on File Prior to Visit  Medication Sig Dispense Refill   calcium citrate-vitamin D (CITRACAL+D) 315-200 MG-UNIT per tablet Take 2 tablets by mouth 2 (two) times daily.     cholecalciferol (VITAMIN D3) 25 MCG (1000 UNIT) tablet Take 1,000 Units by mouth daily.     desvenlafaxine (PRISTIQ) 100 MG 24 hr tablet Take 1 tablet (100 mg total) by mouth daily. 90 tablet 3   hydrochlorothiazide (MICROZIDE) 12.5 MG capsule TAKE ONE CAPSULE DAILY AS NEEDED FOR SWELLING 90 capsule 0   methocarbamol (ROBAXIN) 500 MG tablet Take 1 tablet (500 mg total) by mouth 4 (four) times daily. 30 tablet 1   mirtazapine (REMERON) 15 MG tablet Take 1 tablet (15 mg total) by mouth at bedtime. For sleep 90 tablet 3   Multiple Vitamins-Minerals (MULTIVITAMIN WITH MINERALS) tablet Take 1 tablet by mouth daily.     pravastatin (PRAVACHOL) 40 MG tablet Take  1 tablet (40 mg total) by mouth daily. 90 tablet 3   SYNTHROID 75 MCG tablet TAKE 1 TABLET (75 MCG TOTAL) BY MOUTH DAILY BEFORE BREAKFAST. NAME BRAND ONLY 90 tablet 3   tretinoin (RETIN-A) 0.025 % cream Apply topically at bedtime as needed.     trimethoprim (TRIMPEX) 100 MG tablet Take 100 mg by mouth daily.     No current facility-administered medications on file prior to visit.    ROS Review of Systems  Constitutional: Negative.   HENT: Negative.    Eyes:  Negative for visual disturbance.  Respiratory:  Negative for shortness of breath.   Cardiovascular:  Negative for  chest pain.  Gastrointestinal:  Negative for abdominal pain.  Musculoskeletal:  Negative for arthralgias.    Objective:  BP 129/70   Pulse 68   Temp (!) 97.1 F (36.2 C)   Ht 5' 2"  (1.575 m)   Wt 138 lb 9.6 oz (62.9 kg)   SpO2 99%   BMI 25.35 kg/m   BP Readings from Last 3 Encounters:  09/26/21 129/70  09/07/21 118/72  05/02/21 (!) 160/90    Wt Readings from Last 3 Encounters:  09/26/21 138 lb 9.6 oz (62.9 kg)  09/07/21 137 lb (62.1 kg)  07/05/21 136 lb (61.7 kg)     Physical Exam Constitutional:      General: She is not in acute distress.    Appearance: She is well-developed.  Cardiovascular:     Rate and Rhythm: Normal rate and regular rhythm.  Pulmonary:     Breath sounds: Normal breath sounds.  Musculoskeletal:        General: Normal range of motion.  Skin:    General: Skin is warm and dry.  Neurological:     Mental Status: She is alert and oriented to person, place, and time.     No results found for: "HGBA1C"  Lab Results  Component Value Date   WBC 6.1 03/28/2021   HGB 12.9 03/28/2021   HCT 38.0 03/28/2021   PLT 238 03/28/2021   GLUCOSE 105 (H) 03/28/2021   CHOL 135 03/28/2021   TRIG 107 03/28/2021   HDL 44 03/28/2021   LDLCALC 71 03/28/2021   ALT 26 03/28/2021   AST 32 03/28/2021   NA 141 03/28/2021   K 4.8 03/28/2021   CL 100 03/28/2021   CREATININE 0.77 03/28/2021   BUN 15 03/28/2021   CO2 27 03/28/2021   TSH 0.543 03/28/2021    MM DIAG BREAST TOMO UNI LEFT  Result Date: 11/29/2020 CLINICAL DATA:  Possible asymmetry in the posterior central left breast in the oblique projection of a recent screening mammogram. Previous left breast excisional biopsy in 2018. EXAM: DIGITAL DIAGNOSTIC UNILATERAL LEFT MAMMOGRAM WITH TOMOSYNTHESIS AND CAD TECHNIQUE: Left digital diagnostic mammography and breast tomosynthesis was performed. The images were evaluated with computer-aided detection. COMPARISON:  Previous exam(s). ACR Breast Density Category c:  The breast tissue is heterogeneously dense, which may obscure small masses. FINDINGS: 3D tomographic and 2D generated true lateral and spot compression oblique images of the left breast demonstrate normal appearing fibroglandular tissue at the location of the recently suspected asymmetry, unchanged compared to previous examinations. IMPRESSION: No evidence of malignancy. The recently suspected left breast asymmetry was close apposition of normal breast tissue. RECOMMENDATION: Bilateral screening mammogram in 1 year when due. I have discussed the findings and recommendations with the patient. If applicable, a reminder letter will be sent to the patient regarding the next appointment. BI-RADS CATEGORY  1: Negative.  Electronically Signed   By: Claudie Revering M.D.   On: 11/29/2020 10:38   Assessment & Plan:   Cynthia Garrett was seen today for medical management of chronic issues.  Diagnoses and all orders for this visit:  Hypothyroidism, unspecified type -     TSH + free T4 -     CBC with Differential/Platelet -     CMP14+EGFR -     Lipid panel -     CBC with Differential/Platelet; Standing -     CMP14+EGFR; Standing -     Lipid panel; Standing -     TSH + free T4; Standing  Mixed hyperlipidemia -     TSH + free T4 -     CBC with Differential/Platelet -     CMP14+EGFR -     Lipid panel -     CBC with Differential/Platelet; Standing -     CMP14+EGFR; Standing -     Lipid panel; Standing -     TSH + free T4; Standing   I am having Inis C. Rowe Robert maintain her multivitamin with minerals, calcium citrate-vitamin D, trimethoprim, cholecalciferol, desvenlafaxine, mirtazapine, pravastatin, Synthroid, hydrochlorothiazide, tretinoin, and methocarbamol.  No orders of the defined types were placed in this encounter.    Follow-up: Return in about 6 months (around 03/29/2022) for Hypothyroidism.  Claretta Fraise, M.D.

## 2021-09-27 LAB — CBC WITH DIFFERENTIAL/PLATELET
Basophils Absolute: 0.1 10*3/uL (ref 0.0–0.2)
Basos: 1 %
EOS (ABSOLUTE): 0.1 10*3/uL (ref 0.0–0.4)
Eos: 2 %
Hematocrit: 37.3 % (ref 34.0–46.6)
Hemoglobin: 12.7 g/dL (ref 11.1–15.9)
Immature Grans (Abs): 0 10*3/uL (ref 0.0–0.1)
Immature Granulocytes: 0 %
Lymphocytes Absolute: 1.8 10*3/uL (ref 0.7–3.1)
Lymphs: 31 %
MCH: 30.5 pg (ref 26.6–33.0)
MCHC: 34 g/dL (ref 31.5–35.7)
MCV: 90 fL (ref 79–97)
Monocytes Absolute: 0.4 10*3/uL (ref 0.1–0.9)
Monocytes: 7 %
Neutrophils Absolute: 3.5 10*3/uL (ref 1.4–7.0)
Neutrophils: 59 %
Platelets: 238 10*3/uL (ref 150–450)
RBC: 4.16 x10E6/uL (ref 3.77–5.28)
RDW: 11.9 % (ref 11.7–15.4)
WBC: 5.9 10*3/uL (ref 3.4–10.8)

## 2021-09-27 LAB — CMP14+EGFR
ALT: 21 IU/L (ref 0–32)
AST: 24 IU/L (ref 0–40)
Albumin/Globulin Ratio: 1.8 (ref 1.2–2.2)
Albumin: 4.6 g/dL (ref 3.7–4.7)
Alkaline Phosphatase: 83 IU/L (ref 44–121)
BUN/Creatinine Ratio: 21 (ref 12–28)
BUN: 18 mg/dL (ref 8–27)
Bilirubin Total: 0.3 mg/dL (ref 0.0–1.2)
CO2: 24 mmol/L (ref 20–29)
Calcium: 9.7 mg/dL (ref 8.7–10.3)
Chloride: 101 mmol/L (ref 96–106)
Creatinine, Ser: 0.84 mg/dL (ref 0.57–1.00)
Globulin, Total: 2.6 g/dL (ref 1.5–4.5)
Glucose: 101 mg/dL — ABNORMAL HIGH (ref 70–99)
Potassium: 4.4 mmol/L (ref 3.5–5.2)
Sodium: 141 mmol/L (ref 134–144)
Total Protein: 7.2 g/dL (ref 6.0–8.5)
eGFR: 70 mL/min/{1.73_m2} (ref >59)

## 2021-09-27 LAB — LIPID PANEL
Chol/HDL Ratio: 2.8 ratio (ref 0.0–4.4)
Cholesterol, Total: 163 mg/dL (ref 100–199)
HDL: 59 mg/dL (ref >39)
LDL Chol Calc (NIH): 80 mg/dL (ref 0–99)
Triglycerides: 141 mg/dL (ref 0–149)
VLDL Cholesterol Cal: 24 mg/dL (ref 5–40)

## 2021-09-27 LAB — TSH+FREE T4
Free T4: 1.04 ng/dL (ref 0.82–1.77)
TSH: 0.729 u[IU]/mL (ref 0.450–4.500)

## 2021-09-27 NOTE — Progress Notes (Signed)
Hello Toya,  Your lab result is normal and/or stable.Some minor variations that are not significant are commonly marked abnormal, but do not represent any medical problem for you.  Best regards, Mckinna Demars, M.D.

## 2021-10-18 ENCOUNTER — Other Ambulatory Visit: Payer: Self-pay | Admitting: Family Medicine

## 2021-10-18 DIAGNOSIS — Z1231 Encounter for screening mammogram for malignant neoplasm of breast: Secondary | ICD-10-CM

## 2021-11-22 ENCOUNTER — Other Ambulatory Visit: Payer: Self-pay | Admitting: Family Medicine

## 2021-11-22 DIAGNOSIS — F514 Sleep terrors [night terrors]: Secondary | ICD-10-CM

## 2021-11-29 ENCOUNTER — Encounter: Payer: Self-pay | Admitting: Nurse Practitioner

## 2021-11-29 ENCOUNTER — Ambulatory Visit (INDEPENDENT_AMBULATORY_CARE_PROVIDER_SITE_OTHER): Payer: Medicare Other | Admitting: Nurse Practitioner

## 2021-11-29 VITALS — BP 128/75 | HR 76 | Temp 98.6°F | Ht 62.0 in | Wt 140.0 lb

## 2021-11-29 DIAGNOSIS — J029 Acute pharyngitis, unspecified: Secondary | ICD-10-CM | POA: Diagnosis not present

## 2021-11-29 LAB — CULTURE, GROUP A STREP

## 2021-11-29 LAB — RAPID STREP SCREEN (MED CTR MEBANE ONLY): Strep Gp A Ag, IA W/Reflex: NEGATIVE

## 2021-11-29 MED ORDER — AMOXICILLIN-POT CLAVULANATE 875-125 MG PO TABS: 1.0000 | ORAL_TABLET | Freq: Two times a day (BID) | ORAL | 0 refills | Status: DC

## 2021-11-29 NOTE — Progress Notes (Signed)
Acute Office Visit  Subjective:     Patient ID: Cynthia Garrett, female    DOB: 07-Feb-1941, 81 y.o.   MRN: [id number]  Chief Complaint  Patient presents with   Sore Throat   Nasal Congestion   Generalized Body Aches    Sore Throat  This is a new problem. Episode onset: in the past 3 days. The problem has been unchanged. The maximum temperature recorded prior to her arrival was 100.4 - 100.9 F. The fever has been present for 1 to 2 days. The pain is mild. Pertinent negatives include no congestion. She has had no exposure to strep. She has tried nothing for the symptoms.    Review of Systems  Constitutional:  Positive for chills and fever.  HENT:  Positive for sore throat. Negative for congestion.   Eyes: Negative.   Respiratory: Negative.    Cardiovascular: Negative.   Genitourinary: Negative.   Skin: Negative.  Negative for rash.  All other systems reviewed and are negative.       Objective:    BP 128/75   Pulse 76   Temp 98.6 F (37 C)   Ht '5\' 2"'$  (1.575 m)   Wt 140 lb (63.5 kg)   SpO2 99%   BMI 25.61 kg/m  BP Readings from Last 3 Encounters:  11/29/21 128/75  09/26/21 129/70  09/07/21 118/72   Wt Readings from Last 3 Encounters:  11/29/21 140 lb (63.5 kg)  09/26/21 138 lb 9.6 oz (62.9 kg)  09/07/21 137 lb (62.1 kg)      Physical Exam Vitals reviewed.  Constitutional:      Appearance: Normal appearance. She is well-developed.  HENT:     Head: Normocephalic.     Right Ear: Ear canal normal.     Left Ear: Ear canal normal.     Nose: No congestion.     Mouth/Throat:     Mouth: Mucous membranes are moist. No oral lesions.     Pharynx: Pharyngeal swelling and posterior oropharyngeal erythema present.  Eyes:     Conjunctiva/sclera: Conjunctivae normal.  Cardiovascular:     Rate and Rhythm: Normal rate and regular rhythm.     Pulses: Normal pulses.     Heart sounds: Normal heart sounds.  Skin:    General: Skin is warm.     Findings: No erythema.   Neurological:     Mental Status: She is alert and oriented to person, place, and time.     No results found for any visits on 11/29/21.      Assessment & Plan:  Patient presents with sore throat pain and chills Advice patient to ;  Take meds as prescribed - Use a cool mist humidifier  -Use saline nose sprays frequently -Force fluids -For fever or aches or pains- take Tylenol or ibuprofen. -Augmentin 875-125 mg tablet by mouth  -at home covid-19 test negative -If symptoms do not improve, she may need to be COVID tested to rule this out Follow up with worsening unresolved symptoms  Problem List Items Addressed This Visit   None Visit Diagnoses     Sore throat    -  Primary   Relevant Medications   amoxicillin-clavulanate (AUGMENTIN) 875-125 MG tablet   Other Relevant Orders   Rapid Strep Screen ([facility] ONLY)       Meds ordered this encounter  Medications   amoxicillin-clavulanate (AUGMENTIN) 875-125 MG tablet    Sig: Take 1 tablet by mouth 2 (two) times daily.  Dispense:  20 tablet    Refill:  0    Order Specific Question:   Supervising Provider    Answer:   Claretta Fraise [[id number]]    Return if symptoms worsen or fail to improve.  Ivy Lynn, NP

## 2021-11-29 NOTE — Patient Instructions (Signed)
Sore Throat When you have a sore throat, your throat may feel: Tender. Burning. Irritated. Scratchy. Painful when you swallow. Painful when you talk. Many things can cause a sore throat, such as: An infection. Allergies. Dry air. Smoke or pollution. Radiation treatment for cancer. Gastroesophageal reflux disease (GERD). A tumor. A sore throat can be the first sign of another sickness. It can happen with other problems, like: Coughing. Sneezing. Fever. Swelling of the glands in the neck. Most sore throats go away without treatment. Follow these instructions at home:     Medicines Take over-the-counter and prescription medicines only as told by your doctor. Children often get sore throats. Do not give your child aspirin. Use throat sprays to soothe your throat as told by your health care provider. Managing pain To help with pain: Sip warm liquids, such as broth, herbal tea, or warm water. Eat or drink cold or frozen liquids, such as frozen ice pops. Rinse your mouth (gargle) with a salt water mixture 3-4 times a day or as needed. To make salt water, dissolve -1 tsp (3-6 g) of salt in 1 cup (237 mL) of warm water. Do not swallow this mixture. Suck on hard candy or throat lozenges. Put a cool-mist humidifier in your bedroom at night. Sit in the bathroom with the door closed for 5-10 minutes while you run hot water in the shower. General instructions Do not smoke or use any products that contain nicotine or tobacco. If you need help quitting, ask your doctor. Get plenty of rest. Drink enough fluid to keep your pee (urine) pale yellow. Wash your hands often for at least 20 seconds with soap and water. If soap and water are not available, use hand sanitizer. Contact a doctor if: You have a fever for more than 2-3 days. You keep having symptoms for more than 2-3 days. Your throat does not get better in 7 days. You have a fever and your symptoms suddenly get worse. Your  child who is 3 months to 3 years old has a temperature of 102.2F (39C) or higher. Get help right away if: You have trouble breathing. You cannot swallow fluids, soft foods, or your spit. You have swelling in your throat or neck that gets worse. You feel like you may vomit (nauseous) and this feeling lasts a long time. You cannot stop vomiting. These symptoms may be an emergency. Get help right away. Call your local emergency services (911 in the [place] Do not wait to see if the symptoms will go away. Do not drive yourself to the hospital. Summary A sore throat is a painful, burning, irritated, or scratchy throat. Many things can cause a sore throat. Take over-the-counter medicines only as told by your doctor. Get plenty of rest. Drink enough fluid to keep your pee (urine) pale yellow. Contact a doctor if your symptoms get worse or your sore throat does not get better within 7 days. This information is not intended to replace advice given to you by your health care provider. Make sure you discuss any questions you have with your health care provider. Document Revised: 05/19/2020 Document Reviewed: 05/19/2020 Elsevier Patient Education  2023 [employer]  

## 2021-11-30 ENCOUNTER — Ambulatory Visit
Admission: RE | Admit: 2021-11-30 | Discharge: 2021-11-30 | Disposition: A | Discharge status: Home / Self Care | Payer: Medicare Other | Source: Ambulatory Visit | Attending: Family Medicine | Admitting: Family Medicine

## 2021-11-30 DIAGNOSIS — Z1231 Encounter for screening mammogram for malignant neoplasm of breast: Secondary | ICD-10-CM | POA: Diagnosis not present

## 2021-12-01 DIAGNOSIS — Z1283 Encounter for screening for malignant neoplasm of skin: Secondary | ICD-10-CM | POA: Diagnosis not present

## 2021-12-01 DIAGNOSIS — D225 Melanocytic nevi of trunk: Secondary | ICD-10-CM | POA: Diagnosis not present

## 2021-12-11 ENCOUNTER — Other Ambulatory Visit: Payer: Self-pay | Admitting: Family Medicine

## 2021-12-11 DIAGNOSIS — E782 Mixed hyperlipidemia: Secondary | ICD-10-CM

## 2021-12-31 ENCOUNTER — Other Ambulatory Visit: Payer: Self-pay | Admitting: Family Medicine

## 2021-12-31 DIAGNOSIS — F514 Sleep terrors [night terrors]: Secondary | ICD-10-CM

## 2022-03-23 ENCOUNTER — Ambulatory Visit (INDEPENDENT_AMBULATORY_CARE_PROVIDER_SITE_OTHER): Payer: Medicare Other

## 2022-03-23 ENCOUNTER — Encounter: Payer: Self-pay | Admitting: Family Medicine

## 2022-03-23 ENCOUNTER — Ambulatory Visit (INDEPENDENT_AMBULATORY_CARE_PROVIDER_SITE_OTHER): Payer: Medicare Other | Admitting: Family Medicine

## 2022-03-23 ENCOUNTER — Telehealth: Payer: Self-pay | Admitting: Family Medicine

## 2022-03-23 VITALS — BP 115/71 | HR 87 | Temp 98.5°F | Ht 62.0 in | Wt 138.0 lb

## 2022-03-23 DIAGNOSIS — Z780 Asymptomatic menopausal state: Secondary | ICD-10-CM

## 2022-03-23 DIAGNOSIS — E039 Hypothyroidism, unspecified: Secondary | ICD-10-CM

## 2022-03-23 DIAGNOSIS — R6889 Other general symptoms and signs: Secondary | ICD-10-CM | POA: Diagnosis not present

## 2022-03-23 DIAGNOSIS — E782 Mixed hyperlipidemia: Secondary | ICD-10-CM

## 2022-03-23 DIAGNOSIS — R300 Dysuria: Secondary | ICD-10-CM

## 2022-03-23 LAB — URINALYSIS, ROUTINE W REFLEX MICROSCOPIC
Bilirubin, UA: NEGATIVE
Glucose, UA: NEGATIVE
Ketones, UA: NEGATIVE
Nitrite, UA: POSITIVE — AB
Specific Gravity, UA: 1.02 (ref 1.005–1.030)
Urobilinogen, Ur: 1 mg/dL (ref 0.2–1.0)
pH, UA: 7 (ref 5.0–7.5)

## 2022-03-23 LAB — MICROSCOPIC EXAMINATION
Renal Epithel, UA: NONE SEEN /hpf
WBC, UA: >30 /hpf — AB (ref 0–5)

## 2022-03-23 MED ORDER — SULFAMETHOXAZOLE-TRIMETHOPRIM 800-160 MG PO TABS: 1.0000 | ORAL_TABLET | Freq: Two times a day (BID) | ORAL | 0 refills | Status: DC

## 2022-03-23 NOTE — Telephone Encounter (Signed)
Please let the patient know that I sent their prescription to their pharmacy. Thanks, WS 

## 2022-03-23 NOTE — Telephone Encounter (Signed)
Patient notified

## 2022-03-23 NOTE — Progress Notes (Signed)
Subjective:  Patient ID: Cynthia Garrett, female    DOB: November 13, 1940  Age: 82 y.o. MRN: [id number]  CC: Medical Management of Chronic Issues (6 month, Dysuria (UTI symptoms))   HPI Cynthia Garrett presents for check up, but also havin bladder twinges and spasms.   follow-up on  thyroid. The patient has a history of hypothyroidism for many years. It has been stable recently. Pt. denies any change in  voice, loss of hair, heat or cold intolerance. Energy level has been adequate to good. Patient denies constipation and diarrhea. No myxedema. Medication is as noted below. Verified that pt is taking it daily on an empty stomach. Well tolerated.  Also due to have DEXA.    03/23/2022    9:26 AM 03/23/2022    9:10 AM 09/26/2021    9:02 AM  Depression screen PHQ 2/9  Decreased Interest 0 0 0  Down, Depressed, Hopeless 0 0 0  PHQ - 2 Score 0 0 0  Altered sleeping 1 0 0  Tired, decreased energy 1 0 1  Change in appetite 0 0 0  Feeling bad or failure about yourself  0 0 0  Trouble concentrating 0 0 0  Moving slowly or fidgety/restless 0 0 0  Suicidal thoughts 0 0 0  PHQ-9 Score 2 0 1  Difficult doing work/chores Not difficult at all Not difficult at all Not difficult at all    History Cynthia Garrett has a past medical history of Allergic rhinitis, seasonal, Allergy, Anxiety, Cataract, Headache(784.0), Hyperlipidemia, Hypothyroidism, Left breast mass, Lung nodules, Osteopenia, PONV (postoperative nausea and vomiting), and Thyroid disease.   She has a past surgical history that includes Hysterotomy; Colonoscopy; Cataract extraction w/PHACO (06/26/2011); Cataract extraction w/PHACO (07/06/2011); Breast lumpectomy with radioactive seed localization (Left, 12/06/2016); Breast excisional biopsy (Left, 2018); and Bunionectomy (Right, 04/04/2021).   Her family history includes COPD in her brother; Diabetes in her sister; Heart disease in her mother; Hypertension in her mother and sister.She reports that she  quit smoking about 48 years ago. Her smoking use included cigarettes. She has a 2.50 pack-year smoking history. She has never used smokeless tobacco. She reports that she does not currently use alcohol. She reports that she does not use drugs.    ROS Review of Systems  Constitutional: Negative.   HENT: Negative.    Eyes:  Negative for visual disturbance.  Respiratory:  Negative for shortness of breath.   Cardiovascular:  Negative for chest pain.  Gastrointestinal:  Negative for abdominal pain.  Genitourinary:  Positive for dysuria and frequency.  Musculoskeletal:  Negative for arthralgias.    Objective:  BP 115/71   Pulse 87   Temp 98.5 F (36.9 C)   Ht '5\' 2"'$  (1.575 m)   Wt 138 lb (62.6 kg)   SpO2 99%   BMI 25.24 kg/m   BP Readings from Last 3 Encounters:  03/23/22 115/71  11/29/21 128/75  09/26/21 129/70    Wt Readings from Last 3 Encounters:  03/23/22 138 lb (62.6 kg)  11/29/21 140 lb (63.5 kg)  09/26/21 138 lb 9.6 oz (62.9 kg)     Physical Exam Constitutional:      General: She is not in acute distress.    Appearance: She is well-developed.  Cardiovascular:     Rate and Rhythm: Normal rate and regular rhythm.  Pulmonary:     Breath sounds: Normal breath sounds.  Musculoskeletal:        General: Normal range of motion.  Skin:  General: Skin is warm and dry.  Neurological:     Mental Status: She is alert and oriented to person, place, and time.       Assessment & Plan:   Cynthia Garrett was seen today for medical management of chronic issues.  Diagnoses and all orders for this visit:  Dysuria -     Urinalysis, Routine w reflex microscopic -     Urine Culture  Postmenopause -     DG [facility] DEXA  Hypothyroidism, unspecified type -     DG [facility] DEXA -     CBC with Differential/Platelet -     CMP14+EGFR -     Lipid panel -     TSH -     VITAMIN D 25 Hydroxy (Vit-D Deficiency, Fractures)  Mixed hyperlipidemia -     CMP14+EGFR  Other orders -      Microscopic Examination -     sulfamethoxazole-trimethoprim (BACTRIM DS) 800-160 MG tablet; Take 1 tablet by mouth 2 (two) times daily.       I have discontinued Cynthia Garrett's amoxicillin-clavulanate. I am also having her start on sulfamethoxazole-trimethoprim. Additionally, I am having her maintain her multivitamin with minerals, calcium citrate-vitamin D, trimethoprim, cholecalciferol, Synthroid, tretinoin, methocarbamol, hydrochlorothiazide, desvenlafaxine, pravastatin, and mirtazapine.  Allergies as of 03/23/2022       Reactions   Octacosanol    Tetanus Toxoids Swelling   Redness at site of injection   Sertraline Anxiety        Medication List        Accurate as of March 23, 2022 11:59 PM. If you have any questions, ask your nurse or doctor.          STOP taking these medications    amoxicillin-clavulanate 875-125 MG tablet Commonly known as: AUGMENTIN Stopped by: Claretta Fraise, MD       TAKE these medications    calcium citrate-vitamin D 315-200 MG-UNIT tablet Commonly known as: CITRACAL+D Take 2 tablets by mouth 2 (two) times daily.   cholecalciferol 25 MCG (1000 UNIT) tablet Commonly known as: VITAMIN D3 Take 1,000 Units by mouth daily.   desvenlafaxine 100 MG 24 hr tablet Commonly known as: PRISTIQ TAKE 1 TABLET BY MOUTH EVERY DAY   hydrochlorothiazide 12.5 MG capsule Commonly known as: MICROZIDE TAKE ONE CAPSULE DAILY AS NEEDED FOR SWELLING   methocarbamol 500 MG tablet Commonly known as: ROBAXIN Take 1 tablet (500 mg total) by mouth 4 (four) times daily.   mirtazapine 15 MG tablet Commonly known as: REMERON TAKE 1 TABLET (15 MG TOTAL) BY MOUTH AT BEDTIME. FOR SLEEP   multivitamin with minerals tablet Take 1 tablet by mouth daily.   pravastatin 40 MG tablet Commonly known as: PRAVACHOL TAKE 1 TABLET BY MOUTH EVERY DAY   sulfamethoxazole-trimethoprim 800-160 MG tablet Commonly known as: BACTRIM DS Take 1 tablet by mouth 2 (two)  times daily. Started by: Claretta Fraise, MD   Synthroid 75 MCG tablet Generic drug: levothyroxine TAKE 1 TABLET (75 MCG TOTAL) BY MOUTH DAILY BEFORE BREAKFAST. NAME BRAND ONLY   tretinoin 0.025 % cream Commonly known as: RETIN-A Apply topically at bedtime as needed.   trimethoprim 100 MG tablet Commonly known as: TRIMPEX Take 100 mg by mouth daily.         Follow-up: Return in about 6 months (around 09/21/2022), or if symptoms worsen or fail to improve.  Claretta Fraise, M.D.

## 2022-03-23 NOTE — Telephone Encounter (Signed)
Pt still waiting for Dr Livia Snellen to send in antibiotic to [facility] in [place] for her. She had a visit with him earlier today and was told that medicine would be sent in.

## 2022-03-24 ENCOUNTER — Encounter: Payer: Self-pay | Admitting: Family Medicine

## 2022-03-24 DIAGNOSIS — M810 Age-related osteoporosis without current pathological fracture: Secondary | ICD-10-CM | POA: Diagnosis not present

## 2022-03-24 LAB — CMP14+EGFR
ALT: 20 IU/L (ref 0–32)
AST: 29 IU/L (ref 0–40)
Albumin/Globulin Ratio: 1.6 (ref 1.2–2.2)
Albumin: 4.4 g/dL (ref 3.7–4.7)
Alkaline Phosphatase: 73 IU/L (ref 44–121)
BUN/Creatinine Ratio: 25 (ref 12–28)
BUN: 23 mg/dL (ref 8–27)
Bilirubin Total: 0.4 mg/dL (ref 0.0–1.2)
CO2: 25 mmol/L (ref 20–29)
Calcium: 9.6 mg/dL (ref 8.7–10.3)
Chloride: 100 mmol/L (ref 96–106)
Creatinine, Ser: 0.92 mg/dL (ref 0.57–1.00)
Globulin, Total: 2.7 g/dL (ref 1.5–4.5)
Glucose: 99 mg/dL (ref 70–99)
Potassium: 4.5 mmol/L (ref 3.5–5.2)
Sodium: 139 mmol/L (ref 134–144)
Total Protein: 7.1 g/dL (ref 6.0–8.5)
eGFR: 62 mL/min/{1.73_m2} (ref >59)

## 2022-03-24 LAB — CBC WITH DIFFERENTIAL/PLATELET
Basophils Absolute: 0.1 10*3/uL (ref 0.0–0.2)
Basos: 1 %
EOS (ABSOLUTE): 0.1 10*3/uL (ref 0.0–0.4)
Eos: 1 %
Hematocrit: 37.3 % (ref 34.0–46.6)
Hemoglobin: 12.5 g/dL (ref 11.1–15.9)
Immature Grans (Abs): 0 10*3/uL (ref 0.0–0.1)
Immature Granulocytes: 0 %
Lymphocytes Absolute: 2.2 10*3/uL (ref 0.7–3.1)
Lymphs: 32 %
MCH: 30.3 pg (ref 26.6–33.0)
MCHC: 33.5 g/dL (ref 31.5–35.7)
MCV: 90 fL (ref 79–97)
Monocytes Absolute: 0.5 10*3/uL (ref 0.1–0.9)
Monocytes: 7 %
Neutrophils Absolute: 4.1 10*3/uL (ref 1.4–7.0)
Neutrophils: 59 %
Platelets: 242 10*3/uL (ref 150–450)
RBC: 4.13 x10E6/uL (ref 3.77–5.28)
RDW: 12 % (ref 11.7–15.4)
WBC: 6.9 10*3/uL (ref 3.4–10.8)

## 2022-03-24 LAB — TSH: TSH: 0.81 u[IU]/mL (ref 0.450–4.500)

## 2022-03-24 LAB — LIPID PANEL
Chol/HDL Ratio: 3 ratio (ref 0.0–4.4)
Cholesterol, Total: 157 mg/dL (ref 100–199)
HDL: 53 mg/dL (ref >39)
LDL Chol Calc (NIH): 77 mg/dL (ref 0–99)
Triglycerides: 158 mg/dL — ABNORMAL HIGH (ref 0–149)
VLDL Cholesterol Cal: 27 mg/dL (ref 5–40)

## 2022-03-24 LAB — VITAMIN D 25 HYDROXY (VIT D DEFICIENCY, FRACTURES): Vit D, 25-Hydroxy: 31.8 ng/mL (ref 30.0–100.0)

## 2022-03-24 NOTE — Progress Notes (Signed)
Hello Ty,  Your lab result is normal and/or stable.Some minor variations that are not significant are commonly marked abnormal, but do not represent any medical problem for you.  Best regards, Claretta Fraise, M.D.

## 2022-03-24 NOTE — Progress Notes (Signed)
Your bone density test shows osteoporosis. I recommend weekly fosamax.  You should also take 600 mg of calcium twice daily as well as 2000 units of vitamin D daily. Nurse, if pt. Is agreeable, send in Fosamax 70 mg weekly, #13.  Thanks, WS

## 2022-03-25 LAB — URINE CULTURE

## 2022-03-26 ENCOUNTER — Other Ambulatory Visit: Payer: Self-pay | Admitting: Family Medicine

## 2022-03-26 MED ORDER — CIPROFLOXACIN HCL 250 MG PO TABS: 250.0000 mg | ORAL_TABLET | Freq: Two times a day (BID) | ORAL | 0 refills | Status: DC

## 2022-03-26 NOTE — Progress Notes (Signed)
Your urine culture shows the presence of a germ that is resistant to the current antibiotic you are taking. Please discontinue that medication and take the new one I have sent to your pharmacy.  Best Regards, Timi Reeser, M.D.  

## 2022-03-27 ENCOUNTER — Other Ambulatory Visit: Payer: Self-pay | Admitting: *Deleted

## 2022-03-27 MED ORDER — ALENDRONATE SODIUM 70 MG PO TABS: 70.0000 mg | ORAL_TABLET | ORAL | 3 refills | Status: DC

## 2022-03-28 ENCOUNTER — Other Ambulatory Visit: Payer: Self-pay | Admitting: Family Medicine

## 2022-03-28 DIAGNOSIS — F514 Sleep terrors [night terrors]: Secondary | ICD-10-CM

## 2022-04-03 ENCOUNTER — Other Ambulatory Visit: Payer: Self-pay | Admitting: *Deleted

## 2022-04-03 ENCOUNTER — Telehealth: Payer: Self-pay | Admitting: Family Medicine

## 2022-04-03 DIAGNOSIS — R300 Dysuria: Secondary | ICD-10-CM

## 2022-04-03 NOTE — Telephone Encounter (Signed)
Okay to bring in urine. Order culture too, please.

## 2022-04-03 NOTE — Telephone Encounter (Signed)
Tests ordered Patient aware

## 2022-04-05 ENCOUNTER — Other Ambulatory Visit: Payer: Medicare Other

## 2022-04-05 DIAGNOSIS — R300 Dysuria: Secondary | ICD-10-CM | POA: Diagnosis not present

## 2022-04-05 LAB — URINALYSIS
Bilirubin, UA: NEGATIVE
Glucose, UA: NEGATIVE
Ketones, UA: NEGATIVE
Nitrite, UA: NEGATIVE
Protein,UA: NEGATIVE
RBC, UA: NEGATIVE
Specific Gravity, UA: 1.015 (ref 1.005–1.030)
Urobilinogen, Ur: 0.2 mg/dL (ref 0.2–1.0)
pH, UA: 6.5 (ref 5.0–7.5)

## 2022-04-07 LAB — URINE CULTURE

## 2022-04-10 ENCOUNTER — Other Ambulatory Visit: Payer: Self-pay | Admitting: *Deleted

## 2022-04-10 DIAGNOSIS — R300 Dysuria: Secondary | ICD-10-CM

## 2022-04-10 NOTE — Telephone Encounter (Signed)
Pt calling in for results from repeat urine for UTI. Please call back

## 2022-04-10 NOTE — Telephone Encounter (Signed)
Is asking for results from 1/31

## 2022-04-10 NOTE — Telephone Encounter (Signed)
Okay to come in for lab only UA & culture

## 2022-04-10 NOTE — Telephone Encounter (Signed)
UA is negative and the culture grew no germs

## 2022-04-10 NOTE — Telephone Encounter (Signed)
Patient aware.

## 2022-04-10 NOTE — Telephone Encounter (Signed)
PLEASE REVIEW AND ADVISE  °

## 2022-06-01 ENCOUNTER — Other Ambulatory Visit: Payer: Self-pay | Admitting: Family Medicine

## 2022-06-01 DIAGNOSIS — E782 Mixed hyperlipidemia: Secondary | ICD-10-CM

## 2022-06-01 DIAGNOSIS — E039 Hypothyroidism, unspecified: Secondary | ICD-10-CM

## 2022-07-07 ENCOUNTER — Ambulatory Visit (INDEPENDENT_AMBULATORY_CARE_PROVIDER_SITE_OTHER): Payer: Medicare Other

## 2022-07-07 VITALS — Ht 62.0 in | Wt 137.0 lb

## 2022-07-07 DIAGNOSIS — Z0000 Encounter for general adult medical examination without abnormal findings: Secondary | ICD-10-CM

## 2022-07-07 NOTE — Progress Notes (Signed)
Subjective:   Cynthia Garrett is a 82 y.o. female who presents for Medicare Annual (Subsequent) preventive examination. I connected with  Cynthia Garrett on 07/07/22 by a audio enabled telemedicine application and verified that I am speaking with the correct person using two identifiers.  Patient Location: Home  Provider Location: Home Office  I discussed the limitations of evaluation and management by telemedicine. The patient expressed understanding and agreed to proceed.  Review of Systems           Objective:    There were no vitals filed for this visit. There is no height or weight on file to calculate BMI.     07/05/2021    2:51 PM 06/30/2020   11:03 AM 06/18/2019    8:39 AM 12/06/2016   12:05 PM 11/29/2016    4:20 PM 06/20/2011   11:11 AM  Advanced Directives  Does Patient Have a Medical Advance Directive? Yes Yes Yes Yes Yes Patient has advance directive, copy not in chart  Type of Advance Directive Healthcare Power of [place];Living will Healthcare Power of [place];Living will Living will Healthcare Power of [place];Living will Living will;Healthcare Power of [employer] Power of Attorney  Does patient want to make changes to medical advance directive?   No - Patient declined No - Patient declined    Copy of Healthcare Power of Attorney in Chart? No - copy requested No - copy requested  No - copy requested  Copy requested from family  Pre-existing out of facility DNR order (yellow form or pink MOST form)      No    Current Medications (verified) Outpatient Encounter Medications as of 07/07/2022  Medication Sig   alendronate (FOSAMAX) 70 MG tablet Take 1 tablet (70 mg total) by mouth every 7 (seven) days. Take with a full glass of water on an empty stomach.   calcium citrate-vitamin D (CITRACAL+D) 315-200 MG-UNIT per tablet Take 2 tablets by mouth 2 (two) times daily.   cholecalciferol (VITAMIN D3) 25 MCG (1000 UNIT) tablet Take 1,000 Units by mouth daily.    ciprofloxacin (CIPRO) 250 MG tablet Take 1 tablet (250 mg total) by mouth 2 (two) times daily.   desvenlafaxine (PRISTIQ) 100 MG 24 hr tablet TAKE 1 TABLET BY MOUTH EVERY DAY   hydrochlorothiazide (MICROZIDE) 12.5 MG capsule TAKE ONE CAPSULE DAILY AS NEEDED FOR SWELLING   methocarbamol (ROBAXIN) 500 MG tablet Take 1 tablet (500 mg total) by mouth 4 (four) times daily.   mirtazapine (REMERON) 15 MG tablet TAKE 1 TABLET (15 MG TOTAL) BY MOUTH AT BEDTIME. FOR SLEEP   Multiple Vitamins-Minerals (MULTIVITAMIN WITH MINERALS) tablet Take 1 tablet by mouth daily.   pravastatin (PRAVACHOL) 40 MG tablet TAKE 1 TABLET BY MOUTH EVERY DAY   SYNTHROID 75 MCG tablet TAKE 1 TABLET (75 MCG TOTAL) BY MOUTH DAILY BEFORE BREAKFAST. NAME BRAND ONLY   tretinoin (RETIN-A) 0.025 % cream Apply topically at bedtime as needed.   trimethoprim (TRIMPEX) 100 MG tablet Take 100 mg by mouth daily.   No facility-administered encounter medications on file as of 07/07/2022.    Allergies (verified) Octacosanol, Tetanus toxoids, and Sertraline   History: Past Medical History:  Diagnosis Date   Allergic rhinitis, seasonal    Allergy    Anxiety    Cataract    extraction   Headache(784.0)    Hyperlipidemia    Hypothyroidism    Left breast mass    Lung nodules    Osteopenia    PONV (postoperative nausea  and vomiting)    Thyroid disease    Past Surgical History:  Procedure Laterality Date   BREAST EXCISIONAL BIOPSY Left 2018   BREAST LUMPECTOMY WITH RADIOACTIVE SEED LOCALIZATION Left 12/06/2016   Procedure: LEFT BREAST LUMPECTOMY WITH RADIOACTIVE SEED LOCALIZATION;  Surgeon: Claud Kelp, MD;  Location: [facility];  Service: General;  Laterality: Left;   BUNIONECTOMY Right 04/04/2021   5th toe   CATARACT EXTRACTION W/PHACO  06/26/2011   Procedure: CATARACT EXTRACTION PHACO AND INTRAOCULAR LENS PLACEMENT (IOC);  Surgeon: Gemma Payor, MD;  Location: [facility];  Service: Ophthalmology;  Laterality: Right;   CDE 10.34   CATARACT EXTRACTION W/PHACO  07/06/2011   Procedure: CATARACT EXTRACTION PHACO AND INTRAOCULAR LENS PLACEMENT (IOC);  Surgeon: Gemma Payor, MD;  Location: [facility];  Service: Ophthalmology;  Laterality: Left;  CDE:9.92   COLONOSCOPY     Hysterotomy     fibroid tumor   Family History  Problem Relation Age of Onset   Heart disease Mother    Hypertension Mother    Diabetes Sister    Hypertension Sister    COPD Brother    Anesthesia problems Neg Hx    Hypotension Neg Hx    Malignant hyperthermia Neg Hx    Pseudochol deficiency Neg Hx    Colon cancer Neg Hx    Cancer Neg Hx    Breast cancer Neg Hx    Social History   Socioeconomic History   Marital status: Married    Spouse name: Richard   Number of children: 2   Years of education: Not on file   Highest education level: Not on file  Occupational History    Employer: RETIRED  Tobacco Use   Smoking status: Former    Packs/day: 0.25    Years: 10.00    Additional pack years: 0.00    Total pack years: 2.50    Types: Cigarettes    Quit date: 06/07/1973    Years since quitting: 49.1   Smokeless tobacco: Never   Tobacco comments:    Significant second-hand exposure.  Vaping Use   Vaping Use: Never used  Substance and Sexual Activity   Alcohol use: Not Currently    Alcohol/week: 0.0 standard drinks of alcohol    Comment: social   Drug use: No   Sexual activity: Yes    Birth control/protection: Post-menopausal  Other Topics Concern   Not on file  Social History Narrative   Originally from [place]. Always lived in [place]. Previously has traveled from [facility] to [place], [place], [place], [place], [place], & [place]. Has dog. No bird exposure. Had bats in her attic previously that have been removed. No mold or hot tub exposure. Previously worked as an Midwife in a Hotel manager. Previously enjoyed Yoga & Zumba. She has been doing Tai Chi.    Social Determinants of Health   Financial Resource Strain: Low Risk  (07/05/2021)    Overall Financial Resource Strain (CARDIA)    Difficulty of Paying Living Expenses: Not hard at all  Food Insecurity: No Food Insecurity (07/05/2021)   Hunger Vital Sign    Worried About Running Out of Food in the Last Year: Never true    Ran Out of Food in the Last Year: Never true  Transportation Needs: No Transportation Needs (07/05/2021)   PRAPARE - Administrator, Civil Service (Medical): No    Lack of Transportation (Non-Medical): No  Physical Activity: Insufficiently Active (07/05/2021)   Exercise Vital Sign  Days of Exercise per Week: 4 days    Minutes of Exercise per Session: 30 min  Stress: No Stress Concern Present (07/05/2021)   [employer] of Occupational Health - Occupational Stress Questionnaire    Feeling of Stress : Not at all  Social Connections: Socially Integrated (07/05/2021)   Social Connection and Isolation Panel [NHANES]    Frequency of Communication with Friends and Family: More than three times a week    Frequency of Social Gatherings with Friends and Family: More than three times a week    Attends Religious Services: More than 4 times per year    Active Member of [employer] or Organizations: Yes    Attends Engineer, structural: More than 4 times per year    Marital Status: Married    Tobacco Counseling Counseling given: Not Answered Tobacco comments: Significant second-hand exposure.   Clinical Intake:              How often do you need to have someone help you when you read instructions, pamphlets, or other written materials from your doctor or pharmacy?: (P) 1 - Never  Diabetic?no          Activities of Daily Living    07/03/2022    9:05 AM  In your present state of health, do you have any difficulty performing the following activities:  Hearing? 0  Vision? 0  Difficulty concentrating or making decisions? 0  Walking or climbing stairs? 0  Dressing or bathing? 0  Doing errands, shopping? 0  Preparing Food and  eating ? N  Using the Toilet? N  In the past six months, have you accidently leaked urine? N  Do you have problems with loss of bowel control? N  Managing your Medications? N  Managing your Finances? N  Housekeeping or managing your Housekeeping? N    Patient Care Team: Mechele Claude, MD as PCP - General (Family Medicine) Vivi Barrack, DPM as Consulting Physician (Podiatry) Elesa Hacker, MD as Referring Physician (Dermatology) Alfredo Martinez, MD as Consulting Physician (Urology)  Indicate any recent Medical Services you may have received from other than [facility] providers in the past year (date may be approximate).     Assessment:   This is a routine wellness examination for [place].  Hearing/Vision screen No results found.  Dietary issues and exercise activities discussed:     Goals Addressed   None    Depression Screen    03/23/2022    9:26 AM 03/23/2022    9:10 AM 09/26/2021    9:02 AM 09/26/2021    8:44 AM 09/07/2021    9:30 AM 07/05/2021    2:50 PM 03/28/2021    9:07 AM  PHQ 2/9 Scores  PHQ - 2 Score 0 0 0 0 0 0 0  PHQ- 9 Score 2 0 1    2    Fall Risk    07/03/2022    9:05 AM 03/23/2022    9:27 AM 03/23/2022    9:10 AM 11/29/2021    9:40 AM 09/26/2021    8:44 AM  Fall Risk   Falls in the past year? 0 0 0 0 0  Number falls in past yr: 0 0 0 0   Injury with Fall?  0 0 0   Risk for fall due to :  No Fall Risks No Fall Risks    Follow up  Falls evaluation completed Education provided      FALL RISK PREVENTION PERTAINING TO THE HOME:  Any stairs in or around the home? No  If so, are there any without handrails? No  Home free of loose throw rugs in walkways, pet beds, electrical cords, etc? Yes  Adequate lighting in your home to reduce risk of falls? Yes   ASSISTIVE DEVICES UTILIZED TO PREVENT FALLS:  Life alert? No  Use of a cane, walker or w/c? No  Grab bars in the bathroom? Yes  Shower chair or bench in shower? Yes  Elevated toilet seat  or a handicapped toilet? Yes          07/05/2021    2:53 PM 06/18/2019    8:32 AM  6CIT Screen  What Year? 0 points 0 points  What month? 0 points 0 points  What time? 0 points 0 points  Count back from 20 0 points 0 points  Months in reverse 0 points 0 points  Repeat phrase 4 points 0 points  Total Score 4 points 0 points    Immunizations Immunization History  Administered Date(s) Administered   Fluad Quad(high Dose 65+) 12/15/2015, 12/03/2018, 11/25/2021   Influenza Split 12/11/2013, 12/20/2016   Influenza, High Dose Seasonal PF 12/14/2014, 12/15/2015, 12/13/2017, 12/13/2017, 12/08/2020   Influenza-Unspecified 12/15/2015   Moderna Sars-Covid-2 Vaccination 04/08/2019, 05/06/2019, 12/29/2019   Pneumococcal Conjugate-13 06/16/2015   Pneumococcal Polysaccharide-23 03/06/2005   Pneumococcal-Unspecified 03/06/2014   Respiratory Syncytial Virus Vaccine,Recomb Aduvanted(Arexvy) 02/22/2022   Td 03/06/2010   Zoster Recombinat (Shingrix) 08/16/2020, 11/18/2020    TDAP status: Due, Education has been provided regarding the importance of this vaccine. Advised may receive this vaccine at local pharmacy or Health Dept. Aware to provide a copy of the vaccination record if obtained from local pharmacy or Health Dept. Verbalized acceptance and understanding.  Flu Vaccine status: Up to date  Pneumococcal vaccine status: Up to date  Covid-19 vaccine status: Completed vaccines  Qualifies for Shingles Vaccine? Yes   Zostavax completed Yes   Shingrix Completed?: Yes  Screening Tests Health Maintenance  Topic Date Due   DTaP/Tdap/Td (2 - Tdap) 03/06/2020   COVID-19 Vaccine (4 - 2023-24 season) 11/04/2021   Medicare Annual Wellness (AWV)  07/06/2022   INFLUENZA VACCINE  10/05/2022   MAMMOGRAM  12/01/2022   DEXA SCAN  03/24/2024   Pneumonia Vaccine 57+ Years old  Completed   Zoster Vaccines- Shingrix  Completed   HPV VACCINES  Aged Out   COLONOSCOPY (Pts 45-51yrs Insurance coverage  will need to be confirmed)  Discontinued    Health Maintenance  Health Maintenance Due  Topic Date Due   DTaP/Tdap/Td (2 - Tdap) 03/06/2020   COVID-19 Vaccine (4 - 2023-24 season) 11/04/2021   Medicare Annual Wellness (AWV)  07/06/2022    Colorectal cancer screening: No longer required.   Mammogram status: No longer required due to age.  Bone Density status: Completed 03/24/2022. Results reflect: Bone density results: OSTEOPOROSIS. Repeat every 2 years.  Lung Cancer Screening: (Low Dose CT Chest recommended if Age 58-80 years, 30 pack-year currently smoking OR have quit w/in 15years.) does not qualify.   Lung Cancer Screening Referral: n/a  Additional Screening:  Hepatitis C Screening: does not qualify;   Vision Screening: Recommended annual ophthalmology exams for early detection of glaucoma and other disorders of the eye. Is the patient up to date with their annual eye exam?  Yes  Who is the provider or what is the name of the office in which the patient attends annual eye exams? Dr.Davis  If pt is not established with a provider, would they like  to be referred to a provider to establish care? No .   Dental Screening: Recommended annual dental exams for proper oral hygiene  Community Resource Referral / Chronic Care Management: CRR required this visit?  No   CCM required this visit?  No      Plan:     I have personally reviewed and noted the following in the patient's chart:   Medical and social history Use of alcohol, tobacco or illicit drugs  Current medications and supplements including opioid prescriptions. Patient is not currently taking opioid prescriptions. Functional ability and status Nutritional status Physical activity Advanced directives List of other physicians Hospitalizations, surgeries, and ER visits in previous 12 months Vitals Screenings to include cognitive, depression, and falls Referrals and appointments  In addition, I have reviewed  and discussed with patient certain preventive protocols, quality metrics, and best practice recommendations. A written personalized care plan for preventive services as well as general preventive health recommendations were provided to patient.     Lorrene Reid, LPN   03/11/1094   Nurse Notes: Allergy to TDAP vaccine

## 2022-07-19 DIAGNOSIS — R350 Frequency of micturition: Secondary | ICD-10-CM | POA: Diagnosis not present

## 2022-07-19 DIAGNOSIS — N390 Urinary tract infection, site not specified: Secondary | ICD-10-CM | POA: Diagnosis not present

## 2022-08-07 DIAGNOSIS — H04213 Epiphora due to excess lacrimation, bilateral lacrimal glands: Secondary | ICD-10-CM | POA: Diagnosis not present

## 2022-08-07 DIAGNOSIS — H04123 Dry eye syndrome of bilateral lacrimal glands: Secondary | ICD-10-CM | POA: Diagnosis not present

## 2022-08-30 ENCOUNTER — Other Ambulatory Visit: Payer: Self-pay | Admitting: Family Medicine

## 2022-08-30 DIAGNOSIS — E782 Mixed hyperlipidemia: Secondary | ICD-10-CM

## 2022-08-30 DIAGNOSIS — E039 Hypothyroidism, unspecified: Secondary | ICD-10-CM

## 2022-09-06 DIAGNOSIS — H04123 Dry eye syndrome of bilateral lacrimal glands: Secondary | ICD-10-CM | POA: Diagnosis not present

## 2022-09-06 DIAGNOSIS — H04213 Epiphora due to excess lacrimation, bilateral lacrimal glands: Secondary | ICD-10-CM | POA: Diagnosis not present

## 2022-09-21 ENCOUNTER — Encounter: Payer: Self-pay | Admitting: Family Medicine

## 2022-09-21 ENCOUNTER — Ambulatory Visit (INDEPENDENT_AMBULATORY_CARE_PROVIDER_SITE_OTHER): Payer: Medicare Other | Admitting: Family Medicine

## 2022-09-21 VITALS — BP 115/77 | HR 83 | Temp 96.4°F | Ht 62.0 in | Wt 136.6 lb

## 2022-09-21 DIAGNOSIS — F514 Sleep terrors [night terrors]: Secondary | ICD-10-CM

## 2022-09-21 DIAGNOSIS — E782 Mixed hyperlipidemia: Secondary | ICD-10-CM | POA: Diagnosis not present

## 2022-09-21 DIAGNOSIS — E039 Hypothyroidism, unspecified: Secondary | ICD-10-CM

## 2022-09-21 MED ORDER — PREGABALIN 50 MG PO CAPS: 50.0000 mg | ORAL_CAPSULE | Freq: Every day | ORAL | 0 refills | Status: DC

## 2022-09-21 MED ORDER — MIRTAZAPINE 15 MG PO TABS
ORAL_TABLET | ORAL | 3 refills | Status: DC
Start: 2022-09-21 — End: 2023-09-03

## 2022-09-21 MED ORDER — RISEDRONATE SODIUM 150 MG PO TABS: 150.0000 mg | ORAL_TABLET | ORAL | 3 refills | Status: DC

## 2022-09-21 MED ORDER — DESVENLAFAXINE SUCCINATE ER 100 MG PO TB24
100.0000 mg | ORAL_TABLET | Freq: Every day | ORAL | 3 refills | Status: DC
Start: 2022-09-21 — End: 2023-05-01

## 2022-09-21 MED ORDER — HYDROCHLOROTHIAZIDE 12.5 MG PO CAPS: ORAL_CAPSULE | ORAL | 3 refills | Status: DC

## 2022-09-21 MED ORDER — PRAVASTATIN SODIUM 40 MG PO TABS
40.0000 mg | ORAL_TABLET | Freq: Every day | ORAL | 3 refills | Status: DC
Start: 2022-09-21 — End: 2023-09-03

## 2022-09-21 NOTE — Progress Notes (Unsigned)
Subjective:  Patient ID: Cynthia Garrett, female    DOB: 08-Jan-1941  Age: 82 y.o. MRN: [id number]  CC: Medical Management of Chronic Issues   HPI Cynthia Garrett presents for check up. Sciatica pain waxes and wains. Sometimes an itch, but always has pain rangin from 22-10/10. Can't bend. Stopped fosamax made her feel exhausted. Took it 4 X.     09/21/2022    8:23 AM 09/21/2022    8:15 AM 07/07/2022    2:44 PM  Depression screen PHQ 2/9  Decreased Interest 1 0 0  Down, Depressed, Hopeless 0 0 0  PHQ - 2 Score 1 0 0  Altered sleeping 0    Tired, decreased energy 3    Change in appetite 0    Feeling bad or failure about yourself  0    Trouble concentrating 0    Moving slowly or fidgety/restless 0    Suicidal thoughts 0    PHQ-9 Score 4    Difficult doing work/chores Somewhat difficult        09/21/2022    8:23 AM 03/23/2022    9:27 AM 03/23/2022    9:11 AM 09/26/2021    9:02 AM  GAD 7 : Generalized Anxiety Score  Nervous, Anxious, on Edge 1 1 0 1  Control/stop worrying 1 0 0 0  Worry too much - different things 0 1 0 0  Trouble relaxing 0 0 0 0  Restless 0 0 0 0  Easily annoyed or irritable 0 0 0 0  Afraid - awful might happen 0 0 0 0  Total GAD 7 Score 2 2 0 1  Anxiety Difficulty Not difficult at all Not difficult at all Not difficult at all Not difficult at all    Feels like a ticking time bomb due to husbands bradycardia & val.  History Cynthia Garrett has a past medical history of Allergic rhinitis, seasonal, Allergy, Anxiety, Cataract, Headache(784.0), Hyperlipidemia, Hypothyroidism, Left breast mass, Lung nodules, Osteopenia, PONV (postoperative nausea and vomiting), and Thyroid disease.   She has a past surgical history that includes Hysterotomy; Colonoscopy; Cataract extraction w/PHACO (06/26/2011); Cataract extraction w/PHACO (07/06/2011); Breast lumpectomy with radioactive seed localization (Left, 12/06/2016); Breast excisional biopsy (Left, 2018); and Bunionectomy  (Right, 04/04/2021).   Her family history includes COPD in her brother; Diabetes in her sister; Heart disease in her mother; Hypertension in her mother and sister.She reports that she quit smoking about 49 years ago. Her smoking use included cigarettes. She started smoking about 59 years ago. She has a 2.5 pack-year smoking history. She has never used smokeless tobacco. She reports that she does not currently use alcohol. She reports that she does not use drugs.    ROS Review of Systems  Objective:  BP 115/77   Pulse 83   Temp (!) 96.4 F (35.8 C)   Ht 5\' 2"  (1.575 m)   Wt 136 lb 9.6 oz (62 kg)   SpO2 97%   BMI 24.98 kg/m   BP Readings from Last 3 Encounters:  09/21/22 115/77  03/23/22 115/71  11/29/21 128/75    Wt Readings from Last 3 Encounters:  09/21/22 136 lb 9.6 oz (62 kg)  07/07/22 137 lb (62.1 kg)  03/23/22 138 lb (62.6 kg)     Physical Exam    Assessment & Plan:   Cynthia Garrett was seen today for medical management of chronic issues.  Diagnoses and all orders for this visit:  Hypothyroidism, unspecified type  Mixed hyperlipidemia  Night terrors, adult  I have discontinued Cynthia Garrett C. Dietzman's methocarbamol and ciprofloxacin. I am also having her maintain her multivitamin with minerals, calcium citrate-vitamin D, trimethoprim, cholecalciferol, tretinoin, hydrochlorothiazide, mirtazapine, alendronate, desvenlafaxine, Synthroid, and pravastatin.  Allergies as of 09/21/2022       Reactions   Octacosanol    Tetanus Toxoids Swelling   Redness at site of injection   Sertraline Anxiety        Medication List        Accurate as of September 21, 2022  8:58 AM. If you have any questions, ask your nurse or doctor.          STOP taking these medications    ciprofloxacin 250 MG tablet Commonly known as: Cipro Stopped by: Cynthia Garrett   methocarbamol 500 MG tablet Commonly known as: ROBAXIN Stopped by: Cynthia Garrett       TAKE these  medications    alendronate 70 MG tablet Commonly known as: FOSAMAX Take 1 tablet (70 mg total) by mouth every 7 (seven) days. Take with a full glass of water on an empty stomach.   calcium citrate-vitamin D 315-200 MG-UNIT tablet Commonly known as: CITRACAL+D Take 2 tablets by mouth 2 (two) times daily.   cholecalciferol 25 MCG (1000 UNIT) tablet Commonly known as: VITAMIN D3 Take 1,000 Units by mouth daily.   desvenlafaxine 100 MG 24 hr tablet Commonly known as: PRISTIQ TAKE 1 TABLET BY MOUTH EVERY DAY   hydrochlorothiazide 12.5 MG capsule Commonly known as: MICROZIDE TAKE ONE CAPSULE DAILY AS NEEDED FOR SWELLING   mirtazapine 15 MG tablet Commonly known as: REMERON TAKE 1 TABLET (15 MG TOTAL) BY MOUTH AT BEDTIME. FOR SLEEP   multivitamin with minerals tablet Take 1 tablet by mouth daily.   pravastatin 40 MG tablet Commonly known as: PRAVACHOL TAKE 1 TABLET BY MOUTH EVERY DAY   Synthroid 75 MCG tablet Generic drug: levothyroxine TAKE 1 TABLET (75 MCG TOTAL) BY MOUTH DAILY BEFORE BREAKFAST. NAME BRAND ONLY   tretinoin 0.025 % cream Commonly known as: RETIN-A Apply topically at bedtime as needed.   trimethoprim 100 MG tablet Commonly known as: TRIMPEX Take 100 mg by mouth daily.         Follow-up: No follow-ups on file.  Cynthia Garrett, M.D.

## 2022-09-22 LAB — CMP14+EGFR
ALT: 18 IU/L (ref 0–32)
AST: 24 IU/L (ref 0–40)
Albumin: 4.6 g/dL (ref 3.7–4.7)
Alkaline Phosphatase: 61 IU/L (ref 44–121)
BUN/Creatinine Ratio: 21 (ref 12–28)
BUN: 19 mg/dL (ref 8–27)
Bilirubin Total: 0.4 mg/dL (ref 0.0–1.2)
CO2: 23 mmol/L (ref 20–29)
Calcium: 9.8 mg/dL (ref 8.7–10.3)
Chloride: 99 mmol/L (ref 96–106)
Creatinine, Ser: 0.9 mg/dL (ref 0.57–1.00)
Globulin, Total: 2.6 g/dL (ref 1.5–4.5)
Glucose: 103 mg/dL — ABNORMAL HIGH (ref 70–99)
Potassium: 4.4 mmol/L (ref 3.5–5.2)
Sodium: 139 mmol/L (ref 134–144)
Total Protein: 7.2 g/dL (ref 6.0–8.5)
eGFR: 64 mL/min/{1.73_m2} (ref >59)

## 2022-09-22 LAB — CBC WITH DIFFERENTIAL/PLATELET
Basophils Absolute: 0 10*3/uL (ref 0.0–0.2)
Basos: 1 %
EOS (ABSOLUTE): 0.1 10*3/uL (ref 0.0–0.4)
Eos: 2 %
Hematocrit: 38.6 % (ref 34.0–46.6)
Hemoglobin: 13.1 g/dL (ref 11.1–15.9)
Immature Grans (Abs): 0 10*3/uL (ref 0.0–0.1)
Immature Granulocytes: 0 %
Lymphocytes Absolute: 2 10*3/uL (ref 0.7–3.1)
Lymphs: 30 %
MCH: 31.3 pg (ref 26.6–33.0)
MCHC: 33.9 g/dL (ref 31.5–35.7)
MCV: 92 fL (ref 79–97)
Monocytes Absolute: 0.5 10*3/uL (ref 0.1–0.9)
Monocytes: 8 %
Neutrophils Absolute: 3.9 10*3/uL (ref 1.4–7.0)
Neutrophils: 59 %
Platelets: 230 10*3/uL (ref 150–450)
RBC: 4.19 x10E6/uL (ref 3.77–5.28)
RDW: 12.2 % (ref 11.7–15.4)
WBC: 6.5 10*3/uL (ref 3.4–10.8)

## 2022-09-22 LAB — LIPID PANEL
Chol/HDL Ratio: 2.8 ratio (ref 0.0–4.4)
Cholesterol, Total: 164 mg/dL (ref 100–199)
HDL: 58 mg/dL (ref >39)
LDL Chol Calc (NIH): 80 mg/dL (ref 0–99)
Triglycerides: 154 mg/dL — ABNORMAL HIGH (ref 0–149)
VLDL Cholesterol Cal: 26 mg/dL (ref 5–40)

## 2022-09-22 LAB — TSH+FREE T4
Free T4: 1.17 ng/dL (ref 0.82–1.77)
TSH: 1.19 u[IU]/mL (ref 0.450–4.500)

## 2022-09-23 ENCOUNTER — Encounter: Payer: Self-pay | Admitting: Family Medicine

## 2022-09-24 NOTE — Progress Notes (Signed)
Hello Cynthia Garrett,  Your lab result is normal and/or stable.Some minor variations that are not significant are commonly marked abnormal, but do not represent any medical problem for you.  Best regards, Warren Stacks, M.D.

## 2022-10-18 ENCOUNTER — Other Ambulatory Visit: Payer: Self-pay | Admitting: Family Medicine

## 2022-10-18 DIAGNOSIS — Z1231 Encounter for screening mammogram for malignant neoplasm of breast: Secondary | ICD-10-CM

## 2022-11-02 ENCOUNTER — Telehealth: Payer: Self-pay | Admitting: Family Medicine

## 2022-11-02 ENCOUNTER — Encounter: Payer: Self-pay | Admitting: Family Medicine

## 2022-11-02 ENCOUNTER — Other Ambulatory Visit: Payer: Self-pay

## 2022-11-02 ENCOUNTER — Ambulatory Visit (INDEPENDENT_AMBULATORY_CARE_PROVIDER_SITE_OTHER): Payer: Medicare Other | Admitting: Family Medicine

## 2022-11-02 VITALS — BP 121/74 | HR 83 | Temp 97.4°F | Ht 62.0 in | Wt 137.8 lb

## 2022-11-02 DIAGNOSIS — E039 Hypothyroidism, unspecified: Secondary | ICD-10-CM

## 2022-11-02 DIAGNOSIS — M5430 Sciatica, unspecified side: Secondary | ICD-10-CM | POA: Diagnosis not present

## 2022-11-02 DIAGNOSIS — M549 Dorsalgia, unspecified: Secondary | ICD-10-CM

## 2022-11-02 DIAGNOSIS — F411 Generalized anxiety disorder: Secondary | ICD-10-CM

## 2022-11-02 DIAGNOSIS — M8580 Other specified disorders of bone density and structure, unspecified site: Secondary | ICD-10-CM

## 2022-11-02 DIAGNOSIS — E782 Mixed hyperlipidemia: Secondary | ICD-10-CM

## 2022-11-02 NOTE — Telephone Encounter (Signed)
Lab orders placed in future

## 2022-11-02 NOTE — Progress Notes (Signed)
Subjective:  Patient ID: Cynthia Garrett, female    DOB: 21-Dec-1940  Age: 82 y.o. MRN: [id number]  CC: Sciatica   HPI Cynthia Garrett presents for recheck of sciatica. Resolved for now. Did some rehab. Doing yoga. Afraid it wil come back. She couldn't even sit down.      11/02/2022    8:08 AM 09/21/2022    8:23 AM 09/21/2022    8:15 AM  Depression screen PHQ 2/9  Decreased Interest 1 1 0  Down, Depressed, Hopeless 0 0 0  PHQ - 2 Score 1 1 0  Altered sleeping 0 0   Tired, decreased energy 2 3   Change in appetite 0 0   Feeling bad or failure about yourself  0 0   Trouble concentrating 0 0   Moving slowly or fidgety/restless 0 0   Suicidal thoughts 0 0   PHQ-9 Score 3 4   Difficult doing work/chores Not difficult at all Somewhat difficult     History Cynthia Garrett has a past medical history of Allergic rhinitis, seasonal, Allergy, Anxiety, Cataract, Headache(784.0), Hyperlipidemia, Hypothyroidism, Left breast mass, Lung nodules, Osteopenia, PONV (postoperative nausea and vomiting), and Thyroid disease.   She has a past surgical history that includes Hysterotomy; Colonoscopy; Cataract extraction w/PHACO (06/26/2011); Cataract extraction w/PHACO (07/06/2011); Breast lumpectomy with radioactive seed localization (Left, 12/06/2016); Breast excisional biopsy (Left, 2018); and Bunionectomy (Right, 04/04/2021).   Her family history includes COPD in her brother; Diabetes in her sister; Heart disease in her mother; Hypertension in her mother and sister.She reports that she quit smoking about 49 years ago. Her smoking use included cigarettes. She started smoking about 59 years ago. She has a 2.5 pack-year smoking history. She has never used smokeless tobacco. She reports that she does not currently use alcohol. She reports that she does not use drugs.    ROS Review of Systems  Constitutional:  Positive for activity change.  Musculoskeletal:  Positive for back pain (chronic, but now at baseline  - no sciatica).    Objective:  BP 121/74   Pulse 83   Temp (!) 97.4 F (36.3 C)   Ht 5\' 2"  (1.575 m)   Wt 137 lb 12.8 oz (62.5 kg)   SpO2 95%   BMI 25.20 kg/m   BP Readings from Last 3 Encounters:  11/02/22 121/74  09/21/22 115/77  03/23/22 115/71    Wt Readings from Last 3 Encounters:  11/02/22 137 lb 12.8 oz (62.5 kg)  09/21/22 136 lb 9.6 oz (62 kg)  07/07/22 137 lb (62.1 kg)     Physical Exam Constitutional:      General: She is not in acute distress.    Appearance: She is well-developed.  Cardiovascular:     Rate and Rhythm: Normal rate and regular rhythm.  Pulmonary:     Breath sounds: Normal breath sounds.  Musculoskeletal:        General: Tenderness (lumbar spinalis) present. Normal range of motion.  Skin:    General: Skin is warm and dry.  Neurological:     Mental Status: She is alert and oriented to person, place, and time.       Assessment & Plan:   Cynthia Garrett was seen today for sciatica.  Diagnoses and all orders for this visit:  Back pain with sciatica  Sciatica now resolved. Back care reviewed. Can do light yoga strtching. Avoid twisting motions as with vacuuming. This includes crossing legs while sitting.      I am having Cynthia C.  Christin Garrett maintain her multivitamin with minerals, calcium citrate-vitamin D, trimethoprim, cholecalciferol, tretinoin, Synthroid, desvenlafaxine, hydrochlorothiazide, mirtazapine, pravastatin, risedronate, and pregabalin.  Allergies as of 11/02/2022       Reactions   Octacosanol    Tetanus Toxoids Swelling   Redness at site of injection   Sertraline Anxiety        Medication List        Accurate as of November 02, 2022  8:33 AM. If you have any questions, ask your nurse or doctor.          calcium citrate-vitamin D 315-200 MG-UNIT tablet Commonly known as: CITRACAL+D Take 2 tablets by mouth 2 (two) times daily.   cholecalciferol 25 MCG (1000 UNIT) tablet Commonly known as: VITAMIN D3 Take 1,000  Units by mouth daily.   desvenlafaxine 100 MG 24 hr tablet Commonly known as: PRISTIQ Take 1 tablet (100 mg total) by mouth daily.   hydrochlorothiazide 12.5 MG capsule Commonly known as: MICROZIDE TAKE ONE CAPSULE DAILY AS NEEDED FOR SWELLING   mirtazapine 15 MG tablet Commonly known as: REMERON 1/2 to 1 pill at bedtime For sleep   multivitamin with minerals tablet Take 1 tablet by mouth daily.   pravastatin 40 MG tablet Commonly known as: PRAVACHOL Take 1 tablet (40 mg total) by mouth daily.   pregabalin 50 MG capsule Commonly known as: Lyrica Take 1 capsule (50 mg total) by mouth at bedtime.   risedronate 150 MG tablet Commonly known as: ACTONEL Take 1 tablet (150 mg total) by mouth every 30 (thirty) days. with water on empty stomach, nothing by mouth or lie down for next 30 minutes.   Synthroid 75 MCG tablet Generic drug: levothyroxine TAKE 1 TABLET (75 MCG TOTAL) BY MOUTH DAILY BEFORE BREAKFAST. NAME BRAND ONLY   tretinoin 0.025 % cream Commonly known as: RETIN-A Apply topically at bedtime as needed.   trimethoprim 100 MG tablet Commonly known as: TRIMPEX Take 100 mg by mouth daily.         Follow-up: Return in about 6 months (around 05/04/2023) for Hypothyroidism.  Mechele Claude, M.D.

## 2022-11-30 ENCOUNTER — Other Ambulatory Visit: Payer: Self-pay | Admitting: Family Medicine

## 2022-11-30 DIAGNOSIS — E039 Hypothyroidism, unspecified: Secondary | ICD-10-CM

## 2022-12-11 ENCOUNTER — Ambulatory Visit
Admission: RE | Admit: 2022-12-11 | Discharge: 2022-12-11 | Disposition: A | Discharge status: Home / Self Care | Payer: Medicare Other | Source: Ambulatory Visit | Attending: Family Medicine | Admitting: Family Medicine

## 2022-12-11 DIAGNOSIS — Z1231 Encounter for screening mammogram for malignant neoplasm of breast: Secondary | ICD-10-CM | POA: Diagnosis not present

## 2022-12-17 ENCOUNTER — Other Ambulatory Visit: Payer: Self-pay | Admitting: Family Medicine

## 2023-04-24 ENCOUNTER — Other Ambulatory Visit: Payer: Medicare Other

## 2023-04-24 DIAGNOSIS — E782 Mixed hyperlipidemia: Secondary | ICD-10-CM

## 2023-04-24 DIAGNOSIS — M8580 Other specified disorders of bone density and structure, unspecified site: Secondary | ICD-10-CM | POA: Diagnosis not present

## 2023-04-24 DIAGNOSIS — F411 Generalized anxiety disorder: Secondary | ICD-10-CM

## 2023-04-24 DIAGNOSIS — E039 Hypothyroidism, unspecified: Secondary | ICD-10-CM

## 2023-04-24 LAB — LIPID PANEL

## 2023-04-25 LAB — CBC WITH DIFFERENTIAL/PLATELET
Basophils Absolute: 0 10*3/uL (ref 0.0–0.2)
Basos: 1 %
EOS (ABSOLUTE): 0.1 10*3/uL (ref 0.0–0.4)
Eos: 2 %
Hematocrit: 39.7 % (ref 34.0–46.6)
Hemoglobin: 12.8 g/dL (ref 11.1–15.9)
Immature Grans (Abs): 0 10*3/uL (ref 0.0–0.1)
Immature Granulocytes: 0 %
Lymphocytes Absolute: 1.7 10*3/uL (ref 0.7–3.1)
Lymphs: 22 %
MCH: 29.8 pg (ref 26.6–33.0)
MCHC: 32.2 g/dL (ref 31.5–35.7)
MCV: 92 fL (ref 79–97)
Monocytes Absolute: 0.5 10*3/uL (ref 0.1–0.9)
Monocytes: 6 %
Neutrophils Absolute: 5.5 10*3/uL (ref 1.4–7.0)
Neutrophils: 69 %
Platelets: 234 10*3/uL (ref 150–450)
RBC: 4.3 x10E6/uL (ref 3.77–5.28)
RDW: 12.4 % (ref 11.7–15.4)
WBC: 7.9 10*3/uL (ref 3.4–10.8)

## 2023-04-25 LAB — LIPID PANEL
Cholesterol, Total: 154 mg/dL (ref 100–199)
HDL: 55 mg/dL (ref >39)
LDL CALC COMMENT:: 2.8 ratio (ref 0.0–4.4)
LDL Chol Calc (NIH): 72 mg/dL (ref 0–99)
Triglycerides: 161 mg/dL — ABNORMAL HIGH (ref 0–149)
VLDL Cholesterol Cal: 27 mg/dL (ref 5–40)

## 2023-04-25 LAB — CMP14+EGFR
ALT: 18 [IU]/L (ref 0–32)
AST: 24 [IU]/L (ref 0–40)
Albumin: 4.4 g/dL (ref 3.7–4.7)
Alkaline Phosphatase: 81 [IU]/L (ref 44–121)
BUN/Creatinine Ratio: 23 (ref 12–28)
BUN: 16 mg/dL (ref 8–27)
Bilirubin Total: 0.4 mg/dL (ref 0.0–1.2)
CO2: 25 mmol/L (ref 20–29)
Calcium: 9.5 mg/dL (ref 8.7–10.3)
Chloride: 102 mmol/L (ref 96–106)
Creatinine, Ser: 0.7 mg/dL (ref 0.57–1.00)
Globulin, Total: 2.8 g/dL (ref 1.5–4.5)
Glucose: 91 mg/dL (ref 70–99)
Potassium: 4 mmol/L (ref 3.5–5.2)
Sodium: 141 mmol/L (ref 134–144)
Total Protein: 7.2 g/dL (ref 6.0–8.5)
eGFR: 86 mL/min/{1.73_m2} (ref >59)

## 2023-04-25 LAB — TSH: TSH: 0.745 u[IU]/mL (ref 0.450–4.500)

## 2023-05-01 ENCOUNTER — Ambulatory Visit: Payer: Medicare Other | Admitting: Family Medicine

## 2023-05-01 ENCOUNTER — Encounter: Payer: Self-pay | Admitting: Family Medicine

## 2023-05-01 DIAGNOSIS — E039 Hypothyroidism, unspecified: Secondary | ICD-10-CM

## 2023-05-01 DIAGNOSIS — F514 Sleep terrors [night terrors]: Secondary | ICD-10-CM | POA: Diagnosis not present

## 2023-05-01 MED ORDER — SYNTHROID 75 MCG PO TABS: ORAL_TABLET | ORAL | 2 refills | Status: DC

## 2023-05-01 MED ORDER — DESVENLAFAXINE SUCCINATE ER 50 MG PO TB24: 50.0000 mg | ORAL_TABLET | Freq: Every day | ORAL | 2 refills | Status: DC

## 2023-05-01 NOTE — Progress Notes (Unsigned)
 Subjective:  Patient ID: Cynthia Garrett, female    DOB: 12/14/1940  Age: 83 y.o. MRN: [id number]  CC: Medication Refill and Other (Pt had flu and covid on the same day. That night pt was unable to walk. Pt reports legs feel very heavy and she has to concentrate to pick feet up. )   HPI DONELLA PASCARELLA presents for follow up of weakness of entire body onset the night after taking COVID and Flu shots last fall. Can't exerise with legs, very weak, has to tell herself to pick her feet up. Had to have her husband lift herStill weak, but mobile. Upper extremities not affected. Not dyspneic.     follow-up on  thyroid. The patient has a history of hypothyroidism for many years. It has been stable recently. Pt. denies any change in  voice, loss of hair, heat or cold intolerance. Energy level has been adequate to good. Patient denies constipation and diarrhea. No myxedema. Medication is as noted below. Verified that pt is taking it daily on an empty stomach. Well tolerated.      05/01/2023    8:31 AM 11/02/2022    8:08 AM 09/21/2022    8:23 AM  Depression screen PHQ 2/9  Decreased Interest 1 1 1   Down, Depressed, Hopeless 1 0 0  PHQ - 2 Score 2 1 1   Altered sleeping 1 0 0  Tired, decreased energy 2 2 3   Change in appetite 0 0 0  Feeling bad or failure about yourself  0 0 0  Trouble concentrating 0 0 0  Moving slowly or fidgety/restless 2 0 0  Suicidal thoughts 0 0 0  PHQ-9 Score 7 3 4   Difficult doing work/chores  Not difficult at all Somewhat difficult    History Suda has a past medical history of Allergic rhinitis, seasonal, Allergy, Anxiety, Cataract, Headache(784.0), Hyperlipidemia, Hypothyroidism, Left breast mass, Lung nodules, Osteopenia, PONV (postoperative nausea and vomiting), and Thyroid disease.   She has a past surgical history that includes Hysterotomy; Colonoscopy; Cataract extraction w/PHACO (06/26/2011); Cataract extraction w/PHACO (07/06/2011); Breast lumpectomy with  radioactive seed localization (Left, 12/06/2016); Breast excisional biopsy (Left, 2018); and Bunionectomy (Right, 04/04/2021).   Her family history includes COPD in her brother; Diabetes in her sister; Heart disease in her mother; Hypertension in her mother and sister.She reports that she quit smoking about 49 years ago. Her smoking use included cigarettes. She started smoking about 59 years ago. She has a 2.5 pack-year smoking history. She has never used smokeless tobacco. She reports that she does not currently use alcohol. She reports that she does not use drugs.    ROS Review of Systems  Constitutional: Negative.   HENT: Negative.    Eyes:  Negative for visual disturbance.  Respiratory:  Negative for shortness of breath.   Cardiovascular:  Negative for chest pain.  Gastrointestinal:  Negative for abdominal pain.  Musculoskeletal:  Negative for arthralgias.  Neurological:  Positive for weakness (BLE).    Objective:  BP (!) 141/75   Pulse 85   Temp (!) 97.5 F (36.4 C)   Ht 5\' 2"  (1.575 m)   Wt 134 lb (60.8 kg)   SpO2 99%   BMI 24.51 kg/m   BP Readings from Last 3 Encounters:  05/01/23 (!) 141/75  11/02/22 121/74  09/21/22 115/77    Wt Readings from Last 3 Encounters:  05/01/23 134 lb (60.8 kg)  11/02/22 137 lb 12.8 oz (62.5 kg)  09/21/22 136 lb 9.6 oz (62 kg)  Physical Exam Constitutional:      General: She is not in acute distress.    Appearance: She is well-developed.  HENT:     Head: Normocephalic and atraumatic.  Eyes:     Conjunctiva/sclera: Conjunctivae normal.     Pupils: Pupils are equal, round, and reactive to light.  Neck:     Thyroid: No thyromegaly.  Cardiovascular:     Rate and Rhythm: Normal rate and regular rhythm.     Heart sounds: Normal heart sounds. No murmur heard. Pulmonary:     Effort: Pulmonary effort is normal. No respiratory distress.     Breath sounds: Normal breath sounds. No wheezing or rales.  Abdominal:     General: Bowel  sounds are normal. There is no distension.     Palpations: Abdomen is soft.     Tenderness: There is no abdominal tenderness.  Musculoskeletal:        General: Normal range of motion.     Cervical back: Normal range of motion and neck supple.  Lymphadenopathy:     Cervical: No cervical adenopathy.  Skin:    General: Skin is warm and dry.  Neurological:     Mental Status: She is alert and oriented to person, place, and time.  Psychiatric:        Behavior: Behavior normal.        Thought Content: Thought content normal.        Judgment: Judgment normal.    Results for orders placed or performed in visit on 05/01/23  Sedimentation rate   Collection Time: 05/01/23  9:05 AM  Result Value Ref Range   Sed Rate 16 0 - 40 mm/hr  C-reactive protein   Collection Time: 05/01/23  9:05 AM  Result Value Ref Range   CRP 3 0 - 10 mg/L  T4, free   Collection Time: 05/01/23  9:05 AM  Result Value Ref Range   Free T4 1.15 0.82 - 1.77 ng/dL  T3, Free   Collection Time: 05/01/23  9:05 AM  Result Value Ref Range   T3, Free 2.6 2.0 - 4.4 pg/mL      Assessment & Plan:   Aliviana was seen today for medication refill and other.  Diagnoses and all orders for this visit:  Hypothyroidism, unspecified type -     SYNTHROID 75 MCG tablet; TAKE 1 TABLET (75 MCG TOTAL) BY MOUTH DAILY BEFORE BREAKFAST. NAME BRAND ONLY -     Sedimentation rate -     C-reactive protein -     T4, free -     T3, Free  Night terrors, adult -     desvenlafaxine (PRISTIQ) 50 MG 24 hr tablet; Take 1 tablet (50 mg total) by mouth daily.   Consider PT, Pristiq reduced. She may have had a case of GBS, however she is omproving.  I have discontinued Malachi Bonds C. Tamblyn's risedronate and pregabalin. I have also changed her desvenlafaxine. Additionally, I am having her maintain her multivitamin with minerals, calcium citrate-vitamin D, trimethoprim, cholecalciferol, tretinoin, hydrochlorothiazide, mirtazapine, pravastatin,  cyanocobalamin, and Synthroid.  Allergies as of 05/01/2023       Reactions   Octacosanol    Tetanus Toxoids Swelling   Redness at site of injection   Sertraline Anxiety        Medication List        Accurate as of May 01, 2023 11:59 PM. If you have any questions, ask your nurse or doctor.  STOP taking these medications    pregabalin 50 MG capsule Commonly known as: LYRICA Stopped by: Riven Mabile   risedronate 150 MG tablet Commonly known as: ACTONEL Stopped by: Maribeth Jiles       TAKE these medications    calcium citrate-vitamin D 315-200 MG-UNIT tablet Commonly known as: CITRACAL+D Take 2 tablets by mouth 2 (two) times daily.   cholecalciferol 25 MCG (1000 UNIT) tablet Commonly known as: VITAMIN D3 Take 1,000 Units by mouth daily.   cyanocobalamin 1000 MCG tablet Commonly known as: VITAMIN B12 Take 1,000 mcg by mouth daily.   desvenlafaxine 50 MG 24 hr tablet Commonly known as: PRISTIQ Take 1 tablet (50 mg total) by mouth daily. What changed:  medication strength how much to take Changed by: Rupert Azzara   hydrochlorothiazide 12.5 MG capsule Commonly known as: MICROZIDE TAKE ONE CAPSULE DAILY AS NEEDED FOR SWELLING   mirtazapine 15 MG tablet Commonly known as: REMERON 1/2 to 1 pill at bedtime For sleep   multivitamin with minerals tablet Take 1 tablet by mouth daily.   pravastatin 40 MG tablet Commonly known as: PRAVACHOL Take 1 tablet (40 mg total) by mouth daily.   Synthroid 75 MCG tablet Generic drug: levothyroxine TAKE 1 TABLET (75 MCG TOTAL) BY MOUTH DAILY BEFORE BREAKFAST. NAME BRAND ONLY   tretinoin 0.025 % cream Commonly known as: RETIN-A Apply topically at bedtime as needed.   trimethoprim 100 MG tablet Commonly known as: TRIMPEX Take 100 mg by mouth daily.         Follow-up: Return in about 1 month (around 05/29/2023).  Mechele Claude, M.D.

## 2023-05-02 LAB — T3, FREE: T3, Free: 2.6 pg/mL (ref 2.0–4.4)

## 2023-05-02 LAB — SEDIMENTATION RATE: Sed Rate: 16 mm/h (ref 0–40)

## 2023-05-02 LAB — C-REACTIVE PROTEIN: CRP: 3 mg/L (ref 0–10)

## 2023-05-02 LAB — T4, FREE: Free T4: 1.15 ng/dL (ref 0.82–1.77)

## 2023-05-04 ENCOUNTER — Encounter: Payer: Self-pay | Admitting: Family Medicine

## 2023-05-04 NOTE — Progress Notes (Signed)
Hello Adelaida,  Your lab result is normal and/or stable.Some minor variations that are not significant are commonly marked abnormal, but do not represent any medical problem for you.  Best regards, Warren Stacks, M.D.

## 2023-05-31 ENCOUNTER — Encounter: Payer: Self-pay | Admitting: Family Medicine

## 2023-05-31 ENCOUNTER — Ambulatory Visit: Payer: Medicare Other | Admitting: Family Medicine

## 2023-05-31 VITALS — BP 126/71 | HR 78 | Temp 97.3°F | Ht 62.0 in | Wt 135.0 lb

## 2023-05-31 DIAGNOSIS — F514 Sleep terrors [night terrors]: Secondary | ICD-10-CM

## 2023-05-31 DIAGNOSIS — R269 Unspecified abnormalities of gait and mobility: Secondary | ICD-10-CM

## 2023-05-31 DIAGNOSIS — M5126 Other intervertebral disc displacement, lumbar region: Secondary | ICD-10-CM

## 2023-05-31 DIAGNOSIS — H6123 Impacted cerumen, bilateral: Secondary | ICD-10-CM

## 2023-05-31 MED ORDER — PREDNISONE 10 MG PO TABS: ORAL_TABLET | ORAL | 0 refills | Status: DC

## 2023-05-31 MED ORDER — DESVENLAFAXINE SUCCINATE ER 50 MG PO TB24
50.0000 mg | ORAL_TABLET | Freq: Every day | ORAL | 2 refills | Status: DC
Start: 2023-05-31 — End: 2023-09-03

## 2023-05-31 NOTE — Progress Notes (Signed)
 Subjective:  Patient ID: Cynthia Garrett, female    DOB: 23-Jan-1941  Age: 83 y.o. MRN: [id number]  CC: Medical Management of Chronic Issues   HPI CORINDA AMMON presents for fell recently. Feet feel like they weight 50 lbs. Twisted around and her bottom 1/2 didn't move, but top did and she fell. Brings in 7.5 yr MRI spine showin  DDD, spondylosis and herniation t12-L1 and L5-S1. Concerned this is causing her balance problem and pain in her back   Dcrease of Pristiq decreased the brain fog.     05/31/2023   11:39 AM 05/01/2023    8:31 AM 11/02/2022    8:08 AM  Depression screen PHQ 2/9  Decreased Interest  1 1  Down, Depressed, Hopeless  1 0  PHQ - 2 Score  2 1  Altered sleeping  1 0  Tired, decreased energy  2 2  Change in appetite  0 0  Feeling bad or failure about yourself   0 0  Trouble concentrating  0 0  Moving slowly or fidgety/restless  2 0  Suicidal thoughts  0 0  PHQ-9 Score  7 3  Difficult doing work/chores Not difficult at all  Not difficult at all    History Pinky has a past medical history of Allergic rhinitis, seasonal, Allergy, Anxiety, Cataract, Headache(784.0), Hyperlipidemia, Hypothyroidism, Left breast mass, Lung nodules, Osteopenia, PONV (postoperative nausea and vomiting), and Thyroid disease.   She has a past surgical history that includes Hysterotomy; Colonoscopy; Cataract extraction w/PHACO (06/26/2011); Cataract extraction w/PHACO (07/06/2011); Breast lumpectomy with radioactive seed localization (Left, 12/06/2016); Breast excisional biopsy (Left, 2018); and Bunionectomy (Right, 04/04/2021).   Her family history includes COPD in her brother; Diabetes in her sister; Heart disease in her mother; Hypertension in her mother and sister.She reports that she quit smoking about 50 years ago. Her smoking use included cigarettes. She started smoking about 60 years ago. She has a 2.5 pack-year smoking history. She has never used smokeless tobacco. She reports that  she does not currently use alcohol. She reports that she does not use drugs.    ROS Review of Systems  Constitutional: Negative.   HENT: Negative.    Eyes:  Negative for visual disturbance.  Respiratory:  Negative for shortness of breath.   Cardiovascular:  Negative for chest pain.  Gastrointestinal:  Negative for abdominal pain.  Musculoskeletal:  Positive for gait problem (balance issues). Negative for arthralgias.    Objective:  BP 126/71   Pulse 78   Temp (!) 97.3 F (36.3 C)   Ht 5\' 2"  (1.575 m)   Wt 135 lb (61.2 kg)   SpO2 98%   BMI 24.69 kg/m   BP Readings from Last 3 Encounters:  05/31/23 126/71  05/01/23 (!) 141/75  11/02/22 121/74    Wt Readings from Last 3 Encounters:  05/31/23 135 lb (61.2 kg)  05/01/23 134 lb (60.8 kg)  11/02/22 137 lb 12.8 oz (62.5 kg)     Physical Exam Constitutional:      General: She is not in acute distress.    Appearance: She is well-developed.  HENT:     Right Ear: There is impacted cerumen.     Left Ear: There is impacted cerumen.  Cardiovascular:     Rate and Rhythm: Normal rate and regular rhythm.  Pulmonary:     Breath sounds: Normal breath sounds.  Musculoskeletal:        General: Normal range of motion.  Skin:    General: Skin  is warm and dry.  Neurological:     Mental Status: She is alert and oriented to person, place, and time.      Assessment & Plan:  Gait disturbance -     predniSONE; Take 5 daily for 3 days followed by 4,3,2 and 1 for 3 days each.  Dispense: 45 tablet; Refill: 0  Night terrors, adult -     Desvenlafaxine Succinate ER; Take 1 tablet (50 mg total) by mouth daily.  Dispense: 30 tablet; Refill: 2  HNP (herniated nucleus pulposus), lumbar -     predniSONE; Take 5 daily for 3 days followed by 4,3,2 and 1 for 3 days each.  Dispense: 45 tablet; Refill: 0  Trial of prednisone for gait and lumbar spondylosis, Ortho referral if tx not effective. Would also need new MRI   Follow-up: Return in  about 3 months (around 08/31/2023).  Mechele Claude, M.D.

## 2023-06-08 ENCOUNTER — Encounter: Payer: Self-pay | Admitting: Family Medicine

## 2023-06-11 NOTE — Telephone Encounter (Signed)
 Please advise.  Copied from CRM [id number]. Topic: General - Other >> Jun 11, 2023  9:43 AM Emylou G wrote: Reason for CRM: Patient called.. had to stop taking prednisone.. she wants muscle relaxers prescribed?  Her [phone]

## 2023-06-13 ENCOUNTER — Encounter: Payer: Self-pay | Admitting: Family Medicine

## 2023-06-13 ENCOUNTER — Ambulatory Visit: Admitting: Family Medicine

## 2023-06-13 VITALS — BP 133/73 | HR 65 | Temp 98.0°F | Ht 62.0 in | Wt 136.0 lb

## 2023-06-13 DIAGNOSIS — M545 Low back pain, unspecified: Secondary | ICD-10-CM

## 2023-06-13 MED ORDER — TIZANIDINE HCL 4 MG PO TABS: 4.0000 mg | ORAL_TABLET | Freq: Four times a day (QID) | ORAL | 1 refills | Status: DC | PRN

## 2023-06-13 MED ORDER — TRAMADOL HCL 50 MG PO TABS: 50.0000 mg | ORAL_TABLET | Freq: Four times a day (QID) | ORAL | 0 refills | Status: DC

## 2023-06-13 MED ORDER — BETAMETHASONE SOD PHOS & ACET 6 (3-3) MG/ML IJ SUSP
6.0000 mg | Freq: Once | INTRAMUSCULAR | Status: AC
  Administered 2023-06-13: 6 mg via INTRAMUSCULAR

## 2023-06-13 NOTE — Patient Instructions (Signed)

## 2023-06-13 NOTE — Addendum Note (Signed)
 Addended by: Mechele Claude on: 06/13/2023 09:14 AM   Modules accepted: Orders

## 2023-06-13 NOTE — Progress Notes (Signed)
 Subjective:  Patient ID: Cynthia Garrett, female    DOB: 06-06-1940  Age: 83 y.o. MRN: [id number]  CC: Back Pain (Mid to lower back. Hurts to bend over. Gets worse throughout the day. Achy pain. Gradually getting worse. Pt believes it is muscle spasm because laying on back and stretch knees upward seems to help but tylenol does not. )   HPI Cynthia Garrett presents for Increasing low back pain.  Onset was 1 to 2 weeks ago.  There is pain in the lower back that makes it difficult to straighten out.  She reports going to a funeral several days ago and her pain was 9/10.  She had to seek assistance and leave early.  She felt devastated by the "spectacle "she made of herself.  Pain is now closer to a 5/10.  She points to the lower lumbar region.  She says she thinks it is in the muscles since bent knee chest raises give her temporary relief.  She is ready to attend physical therapy although sheWas not sure about that last time she was here.     05/31/2023   11:39 AM 05/01/2023    8:31 AM 11/02/2022    8:08 AM  Depression screen PHQ 2/9  Decreased Interest  1 1  Down, Depressed, Hopeless  1 0  PHQ - 2 Score  2 1  Altered sleeping  1 0  Tired, decreased energy  2 2  Change in appetite  0 0  Feeling bad or failure about yourself   0 0  Trouble concentrating  0 0  Moving slowly or fidgety/restless  2 0  Suicidal thoughts  0 0  PHQ-9 Score  7 3  Difficult doing work/chores Not difficult at all  Not difficult at all    History Cynthia Garrett has a past medical history of Allergic rhinitis, seasonal, Allergy, Anxiety, Cataract, Headache(784.0), Hyperlipidemia, Hypothyroidism, Left breast mass, Lung nodules, Osteopenia, PONV (postoperative nausea and vomiting), and Thyroid disease.   She has a past surgical history that includes Hysterotomy; Colonoscopy; Cataract extraction w/PHACO (06/26/2011); Cataract extraction w/PHACO (07/06/2011); Breast lumpectomy with radioactive seed localization (Left,  12/06/2016); Breast excisional biopsy (Left, 2018); and Bunionectomy (Right, 04/04/2021).   Her family history includes COPD in her brother; Diabetes in her sister; Heart disease in her mother; Hypertension in her mother and sister.She reports that she quit smoking about 50 years ago. Her smoking use included cigarettes. She started smoking about 60 years ago. She has a 2.5 pack-year smoking history. She has never used smokeless tobacco. She reports that she does not currently use alcohol. She reports that she does not use drugs.    ROS Review of Systems  Objective:  BP 133/73   Pulse 65   Temp 98 F (36.7 C)   Ht 5\' 2"  (1.575 m)   Wt 136 lb (61.7 kg)   SpO2 95%   BMI 24.87 kg/m   BP Readings from Last 3 Encounters:  06/13/23 133/73  05/31/23 126/71  05/01/23 (!) 141/75    Wt Readings from Last 3 Encounters:  06/13/23 136 lb (61.7 kg)  05/31/23 135 lb (61.2 kg)  05/01/23 134 lb (60.8 kg)     Physical Exam Constitutional:      General: She is in acute distress (from the back pain).     Appearance: She is well-developed. She is ill-appearing.  HENT:     Head: Normocephalic and atraumatic.  Eyes:     Conjunctiva/sclera: Conjunctivae normal.  Pupils: Pupils are equal, round, and reactive to light.  Cardiovascular:     Rate and Rhythm: Normal rate and regular rhythm.     Heart sounds: Normal heart sounds. No murmur heard. Pulmonary:     Effort: Pulmonary effort is normal. No respiratory distress.     Breath sounds: Normal breath sounds. No wheezing or rales.  Abdominal:     General: Bowel sounds are normal. There is no distension.     Palpations: Abdomen is soft.     Tenderness: There is no abdominal tenderness.  Musculoskeletal:        General: Tenderness (focused spasm at right L 4 musculature) present.     Cervical back: Normal range of motion and neck supple.     Lumbar back: Spasms and tenderness present. No deformity. Decreased range of motion.      Comments: SLR mild positive at 45 degrees.  Skin:    General: Skin is warm and dry.  Neurological:     Mental Status: She is alert and oriented to person, place, and time.     Deep Tendon Reflexes: Reflexes are normal and symmetric.  Psychiatric:        Behavior: Behavior normal.        Thought Content: Thought content normal.      Assessment & Plan:  Acute midline low back pain without sciatica -     Betamethasone Sod Phos & Acet -     Ambulatory referral to Physical Therapy -     Ambulatory referral to Orthopedics  Other orders -     tiZANidine HCl; Take 1 tablet (4 mg total) by mouth every 6 (six) hours as needed for muscle spasms.  Dispense: 30 tablet; Refill: 1 -     traMADol HCl; Take 1 tablet (50 mg total) by mouth 4 (four) times daily for 5 days. 1-2 tablets up to 4 times a day as needed for pain  Dispense: 20 tablet; Refill: 0     Follow-up: Return in about 2 weeks (around 06/27/2023), or if symptoms worsen or fail to improve, unless seen first by ortho.  Mechele Claude, M.D.

## 2023-07-04 DIAGNOSIS — M5459 Other low back pain: Secondary | ICD-10-CM | POA: Diagnosis not present

## 2023-07-05 ENCOUNTER — Ambulatory Visit: Admitting: Physical Medicine and Rehabilitation

## 2023-07-05 ENCOUNTER — Encounter: Payer: Self-pay | Admitting: Physical Medicine and Rehabilitation

## 2023-07-05 DIAGNOSIS — M545 Low back pain, unspecified: Secondary | ICD-10-CM

## 2023-07-05 DIAGNOSIS — G8929 Other chronic pain: Secondary | ICD-10-CM | POA: Diagnosis not present

## 2023-07-05 DIAGNOSIS — M47816 Spondylosis without myelopathy or radiculopathy, lumbar region: Secondary | ICD-10-CM

## 2023-07-05 DIAGNOSIS — M47819 Spondylosis without myelopathy or radiculopathy, site unspecified: Secondary | ICD-10-CM

## 2023-07-05 NOTE — Progress Notes (Unsigned)
 Pain Scale   Average Pain 3 Patient advising her pain starts in her middle back and radiates to her lower back when doing daily chores. Patient advises that sitting log periods of time increased stiffness in her back. No complaint of problems sleeping. Patient advised she started PT yesterday.        +Driver, -BT, -Dye Allergies.

## 2023-07-05 NOTE — Progress Notes (Unsigned)
 Cynthia Garrett - 83 y.o. female MRN [id number]  Date of birth: 1940/11/07  Office Visit Note: Visit Date: 07/05/2023 PCP: Roise Cleaver, MD Referred by: Roise Cleaver, MD  Subjective: Chief Complaint  Patient presents with   Middle Back - Pain   Lower Back - Pain   HPI: Cynthia Garrett is a 83 y.o. female who comes in today per the request of Dr. Roise Cleaver for evaluation of bilateral lower back pain. Pain ongoing intermittently for several years, worsened over the past several months. Her pain is now constant, reports difficulty completing household tasks such as changing sheets on bed, cooking and cleaning. She describes her pain as dull and aching sensation, currently rates 9 out of 10. Some relief of pain with home exercise regimen, rest and use of medications. Some relief of pain with Tramadol  and Zanaflex . She recently started formal physical therapy. Lumbar radiographs from 2023 shows advanced degenerative changes of lumbar spine. There is grade 2 anterolisthesis of L4 on L5 and severe lower lumbar facet arthropathy. No history of lumbar surgery/injections. She reports episode of radicular pain in the past that eventually resolved with time and medications. Patient denies focal weakness, numbness and tingling. No recent trauma or falls. She does use cane intermittently to assist with ambulation.       Review of Systems  Musculoskeletal:  Positive for back pain.  Neurological:  Negative for tingling, sensory change, focal weakness and weakness.  All other systems reviewed and are negative.  Otherwise per HPI.  Assessment & Plan: Visit Diagnoses:    ICD-10-CM   1. Chronic bilateral low back pain without sciatica  M54.50 MR LUMBAR SPINE WO CONTRAST   G89.29     2. Spondylosis without myelopathy or radiculopathy  M47.819 MR LUMBAR SPINE WO CONTRAST    3. Facet arthropathy, lumbar  M47.816 MR LUMBAR SPINE WO CONTRAST       Plan: Findings:  Chronic, worsening and severe  bilateral lower back pain. No radicular symptoms down the legs. Patient continues to have severe pain despite good conservative therapies such as formal physical therapy, home exercise regimen, rest and use of medications. Patients clinical presentation and exam are consistent with facet mediated pain. She does have pain with lumbar extension on exam today. I am also concerned about worsening central canal stenosis. Lumbar x-rays from 2023 shows fairly significantly anterolisthesis of L4 on L5 and lower lumbar facet arthropathy. We discussed treatment plan in detail today. Given chronicity of her symptoms and recent increased pain I placed order for lumbar MRI imaging. I also discussed possibility of diagnostic medial branch blocks and radiofrequency ablation. Patient has no questions at this time. She can continue with formal physical therapy. I will see her back for lumbar MRI review and to discuss further treatment. No red flag symptoms noted upon exam today.     Meds & Orders: No orders of the defined types were placed in this encounter.   Orders Placed This Encounter  Procedures   MR LUMBAR SPINE WO CONTRAST    Follow-up: Return for Lumbar MRI review.   Procedures: No procedures performed      Clinical History: Narrative & Impression CLINICAL DATA:  Back pain   EXAM: LUMBAR SPINE - 2-3 VIEW   COMPARISON:  Lumbar spine x-ray 02/11/2019   FINDINGS: Mild levocurvature of the lumbar spine. Lumbar vertebral body height are preserved without evidence of fracture. Minimal grade 1 anterolisthesis of L1 on L2. Grade 2 anterolisthesis of L4 on L5.  Moderate to severe intervertebral disc space narrowing at L2-L3 and L3-L4. Facet arthropathy in the lower lumbar spine.   IMPRESSION: Advanced degenerative changes of the lumbar spine.     Electronically Signed   By: Armond Lands M.D.   On: 09/07/2021 13:37   She reports that she quit smoking about 50 years ago. Her smoking use  included cigarettes. She started smoking about 60 years ago. She has a 2.5 pack-year smoking history. She has never used smokeless tobacco. No results for input(s): "HGBA1C", "LABURIC" in the last 8760 hours.  Objective:  VS:  HT:    WT:   BMI:     BP:   HR: bpm  TEMP: ( )  RESP:  Physical Exam Vitals and nursing note reviewed.  HENT:     Head: Normocephalic and atraumatic.     Right Ear: External ear normal.     Left Ear: External ear normal.     Nose: Nose normal.     Mouth/Throat:     Mouth: Mucous membranes are moist.  Eyes:     Extraocular Movements: Extraocular movements intact.  Cardiovascular:     Rate and Rhythm: Normal rate.     Pulses: Normal pulses.  Pulmonary:     Effort: Pulmonary effort is normal.  Abdominal:     General: Abdomen is flat. There is no distension.  Musculoskeletal:        General: Tenderness present.     Cervical back: Normal range of motion.     Comments: Patient rises from seated position to standing without difficulty. Pain noted with facet loading and lumbar extension. 5/5 strength noted with bilateral hip flexion, knee flexion/extension, ankle dorsiflexion/plantarflexion and EHL. No clonus noted bilaterally. No pain upon palpation of greater trochanters. No pain with internal/external rotation of bilateral hips. Sensation intact bilaterally. Negative slump test bilaterally. Ambulates without aid, gait steady.     Skin:    General: Skin is warm and dry.     Capillary Refill: Capillary refill takes less than 2 seconds.  Neurological:     General: No focal deficit present.     Mental Status: She is alert and oriented to person, place, and time.  Psychiatric:        Mood and Affect: Mood normal.        Behavior: Behavior normal.     Ortho Exam  Imaging: No results found.  Past Medical/Family/Surgical/Social History: Medications & Allergies reviewed per EMR, new medications updated. 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 12/06/2016   Chronic seasonal allergic rhinitis 11/17/2015   Anxiety state 11/17/2015   Hypothyroidism 11/17/2015   Hyperlipidemia 11/17/2015   Osteopenia 11/17/2015   Tendonitis 11/17/2015   Multiple lung nodules on CT 10/25/2015   Irritable bowel syndrome with both constipation and diarrhea 09/17/2015   Past Medical History:  Diagnosis Date   Allergic rhinitis, seasonal    Allergy    Anxiety    Cataract    extraction   Headache(784.0)    Hyperlipidemia    Hypothyroidism    Left breast mass    Lung nodules    Osteopenia    PONV (postoperative nausea and vomiting)    Thyroid  disease    Family History  Problem Relation Age of Onset   Heart disease Mother    Hypertension Mother    Diabetes Sister  Hypertension Sister    COPD Brother    Anesthesia problems Neg Hx    Hypotension Neg Hx    Malignant hyperthermia Neg Hx    Pseudochol deficiency Neg Hx    Colon cancer Neg Hx    Cancer Neg Hx    Breast cancer Neg Hx    Past Surgical History:  Procedure Laterality Date   BREAST EXCISIONAL BIOPSY Left 2018   BREAST LUMPECTOMY WITH RADIOACTIVE SEED LOCALIZATION Left 12/06/2016   Procedure: LEFT BREAST LUMPECTOMY WITH RADIOACTIVE SEED LOCALIZATION;  Surgeon: Boyce Byes, MD;  Location: [facility];  Service: General;  Laterality: Left;   BUNIONECTOMY Right 04/04/2021   5th toe   CATARACT EXTRACTION W/PHACO  06/26/2011   Procedure: CATARACT EXTRACTION PHACO AND INTRAOCULAR LENS PLACEMENT (IOC);  Surgeon: Anner Kill, MD;  Location: [facility];  Service: Ophthalmology;  Laterality: Right;  CDE 10.34   CATARACT EXTRACTION W/PHACO  07/06/2011   Procedure: CATARACT EXTRACTION PHACO AND INTRAOCULAR LENS PLACEMENT (IOC);  Surgeon: Anner Kill, MD;  Location: [facility];  Service: Ophthalmology;  Laterality: Left;  CDE:9.92   COLONOSCOPY      Hysterotomy     fibroid tumor   Social History   Occupational History    Employer: RETIRED  Tobacco Use   Smoking status: Former    Current packs/day: 0.00    Average packs/day: 0.3 packs/day for 10.0 years (2.5 ttl pk-yrs)    Types: Cigarettes    Start date: 06/08/1963    Quit date: 06/07/1973    Years since quitting: 50.1   Smokeless tobacco: Never   Tobacco comments:    Significant second-hand exposure.  Vaping Use   Vaping status: Never Used  Substance and Sexual Activity   Alcohol use: Not Currently    Alcohol/week: 0.0 standard drinks of alcohol    Comment: social   Drug use: No   Sexual activity: Yes    Birth control/protection: Post-menopausal

## 2023-07-05 NOTE — Progress Notes (Unsigned)
 Core Outcome Measures Index (COMI) Back Score  Average Pain 8  COMI Score  70%

## 2023-07-07 ENCOUNTER — Ambulatory Visit
Admission: RE | Admit: 2023-07-07 | Discharge: 2023-07-07 | Disposition: A | Discharge status: Home / Self Care | Source: Ambulatory Visit | Attending: Physical Medicine and Rehabilitation | Admitting: Physical Medicine and Rehabilitation

## 2023-07-07 DIAGNOSIS — M5126 Other intervertebral disc displacement, lumbar region: Secondary | ICD-10-CM | POA: Diagnosis not present

## 2023-07-07 DIAGNOSIS — M47819 Spondylosis without myelopathy or radiculopathy, site unspecified: Secondary | ICD-10-CM

## 2023-07-07 DIAGNOSIS — M48061 Spinal stenosis, lumbar region without neurogenic claudication: Secondary | ICD-10-CM | POA: Diagnosis not present

## 2023-07-07 DIAGNOSIS — G8929 Other chronic pain: Secondary | ICD-10-CM

## 2023-07-07 DIAGNOSIS — M4316 Spondylolisthesis, lumbar region: Secondary | ICD-10-CM | POA: Diagnosis not present

## 2023-07-07 DIAGNOSIS — M47816 Spondylosis without myelopathy or radiculopathy, lumbar region: Secondary | ICD-10-CM

## 2023-07-09 DIAGNOSIS — M5459 Other low back pain: Secondary | ICD-10-CM | POA: Diagnosis not present

## 2023-07-10 ENCOUNTER — Ambulatory Visit: Payer: Medicare Other

## 2023-07-10 VITALS — BP 133/73 | HR 65 | Ht 62.0 in | Wt 136.0 lb

## 2023-07-10 DIAGNOSIS — Z0000 Encounter for general adult medical examination without abnormal findings: Secondary | ICD-10-CM

## 2023-07-10 NOTE — Patient Instructions (Signed)
 Cynthia Garrett , Thank you for taking time to come for your Medicare Wellness Visit. I appreciate your ongoing commitment to your health goals. Please review the following plan we discussed and let me know if I can assist you in the future.   Referrals/Orders/Follow-Ups/Clinician Recommendations: n/a  This is a list of the screening recommended for you and due dates:  Health Maintenance  Topic Date Due   COVID-19 Vaccine (5 - 2024-25 season) 06/22/2023   Flu Shot  10/05/2023   Mammogram  12/11/2023   DEXA scan (bone density measurement)  03/24/2024   Medicare Annual Wellness Visit  07/09/2024   Pneumonia Vaccine  Completed   Zoster (Shingles) Vaccine  Completed   HPV Vaccine  Aged Out   Meningitis B Vaccine  Aged Out   DTaP/Tdap/Td vaccine  Discontinued   Colon Cancer Screening  Discontinued   Hepatitis C Screening  Discontinued    Advanced directives: (Copy Requested) Please bring a copy of your health care power of attorney and living will to the office to be added to your chart at your convenience. You can mail to [facility] [address] [place], [place] [address] or email to [email or web address]  Next Medicare Annual Wellness Visit scheduled for next year: Yes

## 2023-07-10 NOTE — Progress Notes (Signed)
 Subjective:   Cynthia Garrett is a 83 y.o. who presents for a Medicare Wellness preventive visit.  Visit Complete: Virtual I connected with  Cynthia Garrett on 07/10/23 by a audio enabled telemedicine application and verified that I am speaking with the correct person using two identifiers.  Patient Location: Home  Provider Location: Home Office  I discussed the limitations of evaluation and management by telemedicine. The patient expressed understanding and agreed to proceed.  Vital Signs: Because this visit was a virtual/telehealth visit, some criteria may be missing or patient reported. Any vitals not documented were not able to be obtained and vitals that have been documented are patient reported.  VideoDeclined- This patient declined Librarian, academic. Therefore the visit was completed with audio only.  Persons Participating in Visit: Patient.  AWV Questionnaire: Yes: Patient Medicare AWV questionnaire was completed by the patient on 07/06/23; I have confirmed that all information answered by patient is correct and no changes since this date.  Cardiac Risk Factors include: advanced age (>4men, >69 women);dyslipidemia     Objective:    Today's Vitals   07/10/23 1016  BP: 133/73  Pulse: 65  Weight: 136 lb (61.7 kg)  Height: 5\' 2"  (1.575 m)   Body mass index is 24.87 kg/m.     07/10/2023   10:36 AM 07/07/2022    2:45 PM 07/05/2021    2:51 PM 06/30/2020   11:03 AM 06/18/2019    8:39 AM 12/06/2016   12:05 PM 11/29/2016    4:20 PM  Advanced Directives  Does Patient Have a Medical Advance Directive? Yes Yes Yes Yes Yes Yes Yes  Type of Estate agent of [place];Living will Healthcare Power of [place];Living will Healthcare Power of [place];Living will Healthcare Power of [place];Living will Living will Healthcare Power of [place];Living will Living will;Healthcare Power of Attorney  Does patient want to make changes to medical  advance directive?     No - Patient declined No - Patient declined   Copy of Healthcare Power of Attorney in Chart? No - copy requested No - copy requested No - copy requested No - copy requested  No - copy requested     Current Medications (verified) Outpatient Encounter Medications as of 07/10/2023  Medication Sig   calcium citrate-vitamin D  (CITRACAL+D) 315-200 MG-UNIT per tablet Take 2 tablets by mouth 2 (two) times daily.   cholecalciferol (VITAMIN D3) 25 MCG (1000 UNIT) tablet Take 1,000 Units by mouth daily.   cyanocobalamin (VITAMIN B12) 1000 MCG tablet Take 1,000 mcg by mouth daily.   desvenlafaxine  (PRISTIQ ) 50 MG 24 hr tablet Take 1 tablet (50 mg total) by mouth daily.   hydrochlorothiazide  (MICROZIDE ) 12.5 MG capsule TAKE ONE CAPSULE DAILY AS NEEDED FOR SWELLING   mirtazapine  (REMERON ) 15 MG tablet 1/2 to 1 pill at bedtime For sleep   Multiple Vitamins-Minerals (MULTIVITAMIN WITH MINERALS) tablet Take 1 tablet by mouth daily.   pravastatin  (PRAVACHOL ) 40 MG tablet Take 1 tablet (40 mg total) by mouth daily.   SYNTHROID  75 MCG tablet TAKE 1 TABLET (75 MCG TOTAL) BY MOUTH DAILY BEFORE BREAKFAST. NAME BRAND ONLY   tiZANidine  (ZANAFLEX ) 4 MG tablet Take 1 tablet (4 mg total) by mouth every 6 (six) hours as needed for muscle spasms.   tretinoin (RETIN-A) 0.025 % cream Apply topically at bedtime as needed.   trimethoprim  (TRIMPEX ) 100 MG tablet Take 100 mg by mouth daily.   No facility-administered encounter medications on file as of 07/10/2023.  Allergies (verified) Octacosanol, Tetanus toxoids, and Sertraline    History: Past Medical History:  Diagnosis Date   Allergic rhinitis, seasonal    Allergy    Anxiety    Arthritis    Cataract    extraction   Headache(784.0)    Hyperlipidemia    Hypothyroidism    Left breast mass    Lung nodules    Osteopenia    PONV (postoperative nausea and vomiting)    Thyroid  disease    Past Surgical History:  Procedure Laterality Date    BREAST EXCISIONAL BIOPSY Left 2018   BREAST LUMPECTOMY WITH RADIOACTIVE SEED LOCALIZATION Left 12/06/2016   Procedure: LEFT BREAST LUMPECTOMY WITH RADIOACTIVE SEED LOCALIZATION;  Surgeon: Boyce Byes, MD;  Location: [facility];  Service: General;  Laterality: Left;   BREAST SURGERY     BUNIONECTOMY Right 04/04/2021   5th toe   CATARACT EXTRACTION W/PHACO  06/26/2011   Procedure: CATARACT EXTRACTION PHACO AND INTRAOCULAR LENS PLACEMENT (IOC);  Surgeon: Anner Kill, MD;  Location: [facility];  Service: Ophthalmology;  Laterality: Right;  CDE 10.34   CATARACT EXTRACTION W/PHACO  07/06/2011   Procedure: CATARACT EXTRACTION PHACO AND INTRAOCULAR LENS PLACEMENT (IOC);  Surgeon: Anner Kill, MD;  Location: [facility];  Service: Ophthalmology;  Laterality: Left;  CDE:9.92   COLONOSCOPY     EYE SURGERY     Hysterotomy     fibroid tumor   Family History  Problem Relation Age of Onset   Heart disease Mother    Hypertension Mother    Diabetes Sister    Hypertension Sister    COPD Brother    Anesthesia problems Neg Hx    Hypotension Neg Hx    Malignant hyperthermia Neg Hx    Pseudochol deficiency Neg Hx    Colon cancer Neg Hx    Cancer Neg Hx    Breast cancer Neg Hx    Social History   Socioeconomic History   Marital status: Married    Spouse name: Richard   Number of children: 2   Years of education: Not on file   Highest education level: GED or equivalent  Occupational History    Employer: RETIRED  Tobacco Use   Smoking status: Former    Current packs/day: 0.00    Average packs/day: 0.3 packs/day for 10.0 years (2.5 ttl pk-yrs)    Types: Cigarettes    Start date: 06/08/1963    Quit date: 06/07/1973    Years since quitting: 50.1   Smokeless tobacco: Never   Tobacco comments:    Significant second-hand exposure.  Vaping Use   Vaping status: Never Used  Substance and Sexual Activity   Alcohol use: Never    Comment: social   Drug use: No   Sexual activity: Yes    Birth  control/protection: Post-menopausal  Other Topics Concern   Not on file  Social History Narrative   Originally from [place]. Always lived in [place]. Previously has traveled from [facility] to [place] , [place], [place] , [place], [place], & [place]. Has dog. No bird exposure. Had bats in her attic previously that have been removed. No mold or hot tub exposure. Previously worked as an Midwife in a Hotel manager. Previously enjoyed Yoga & Zumba. She has been doing Tai Chi.    Social Drivers of Corporate investment banker Strain: Low Risk  (07/10/2023)   Overall Financial Resource Strain (CARDIA)    Difficulty of Paying Living Expenses: Not hard at all  Food Insecurity: No Food Insecurity (  07/10/2023)   Hunger Vital Sign    Worried About Running Out of Food in the Last Year: Never true    Ran Out of Food in the Last Year: Never true  Transportation Needs: No Transportation Needs (07/10/2023)   PRAPARE - Administrator, Civil Service (Medical): No    Lack of Transportation (Non-Medical): No  Physical Activity: Sufficiently Active (07/10/2023)   Exercise Vital Sign    Days of Exercise per Week: 7 days    Minutes of Exercise per Session: 60 min  Recent Concern: Physical Activity - Insufficiently Active (04/27/2023)   Exercise Vital Sign    Days of Exercise per Week: 2 days    Minutes of Exercise per Session: 40 min  Stress: No Stress Concern Present (07/10/2023)   [employer] of Occupational Health - Occupational Stress Questionnaire    Feeling of Stress : Not at all  Recent Concern: Stress - Stress Concern Present (04/27/2023)   [employer] of Occupational Health - Occupational Stress Questionnaire    Feeling of Stress : To some extent  Social Connections: Socially Integrated (07/10/2023)   Social Connection and Isolation Panel [NHANES]    Frequency of Communication with Friends and Family: More than three times a week    Frequency of Social Gatherings with Friends and Family: More than  three times a week    Attends Religious Services: More than 4 times per year    Active Member of [employer] or Organizations: Yes    Attends Engineer, structural: More than 4 times per year    Marital Status: Married    Tobacco Counseling Counseling given: Yes Tobacco comments: Significant second-hand exposure.    Clinical Intake:  Pre-visit preparation completed: Yes  Pain : No/denies pain     BMI - recorded: 24.87 Nutritional Status: BMI of 19-24  Normal Nutritional Risks: None Diabetes: No  No results found for: "HGBA1C"   How often do you need to have someone help you when you read instructions, pamphlets, or other written materials from your doctor or pharmacy?: 1 - Never  Interpreter Needed?: No  Information entered by :: Alia T/cma   Activities of Daily Living     07/06/2023    2:14 PM  In your present state of health, do you have any difficulty performing the following activities:  Hearing? 0  Vision? 0  Difficulty concentrating or making decisions? 0  Walking or climbing stairs? 1  Dressing or bathing? 0  Doing errands, shopping? 0  Preparing Food and eating ? N  Using the Toilet? N  In the past six months, have you accidently leaked urine? N  Do you have problems with loss of bowel control? N  Managing your Medications? N  Managing your Finances? N  Housekeeping or managing your Housekeeping? N    Patient Care Team: Roise Cleaver, MD as PCP - General (Family Medicine) Charity Conch, DPM as Consulting Physician (Podiatry) Wash Hack, MD as Referring Physician (Dermatology) Erman Hayward, MD as Consulting Physician (Urology)  Indicate any recent Medical Services you may have received from other than [facility] providers in the past year (date may be approximate).     Assessment:   This is a routine wellness examination for [place].  Hearing/Vision screen Hearing Screening - Comments:: Pt denies hearing dif Vision  Screening - Comments:: Pt denies vision dif--pt goes to    Goals Addressed             This Visit's Progress  Patient Stated       Pt wants to work on getting her back better and to be the best 83yr old lady that she can be       Depression Screen     07/10/2023   10:37 AM 05/31/2023   11:39 AM 05/01/2023    8:31 AM 11/02/2022    8:08 AM 09/21/2022    8:23 AM 09/21/2022    8:15 AM 07/07/2022    2:44 PM  PHQ 2/9 Scores  PHQ - 2 Score 3  2 1 1  0 0  PHQ- 9 Score 15  7 3 4     Exception Documentation  Patient refusal         Fall Risk     07/06/2023    2:14 PM 11/02/2022    8:08 AM 09/21/2022    8:15 AM 07/07/2022    2:43 PM 07/03/2022    9:05 AM  Fall Risk   Falls in the past year? 1 0 0 0 0  Number falls in past yr: 0   0 0  Injury with Fall? 0   0   Risk for fall due to :    No Fall Risks   Follow up    Falls prevention discussed     MEDICARE RISK AT HOME:  Medicare Risk at Home Any stairs in or around the home?: (Patient-Rptd) No If so, are there any without handrails?: (Patient-Rptd) No Home free of loose throw rugs in walkways, pet beds, electrical cords, etc?: (Patient-Rptd) No Adequate lighting in your home to reduce risk of falls?: (Patient-Rptd) Yes Life alert?: (Patient-Rptd) No Use of a cane, walker or w/c?: (Patient-Rptd) No Grab bars in the bathroom?: (Patient-Rptd) Yes Shower chair or bench in shower?: (Patient-Rptd) Yes Elevated toilet seat or a handicapped toilet?: (Patient-Rptd) Yes  TIMED UP AND GO:  Was the test performed?  no  Cognitive Function: 6CIT completed        07/10/2023   10:44 AM 07/07/2022    2:46 PM 07/05/2021    2:53 PM 06/18/2019    8:32 AM  6CIT Screen  What Year? 0 points 0 points 0 points 0 points  What month? 0 points 0 points 0 points 0 points  What time? 0 points 0 points 0 points 0 points  Count back from 20 0 points 0 points 0 points 0 points  Months in reverse 0 points 0 points 0 points 0 points  Repeat phrase 0 points 0  points 4 points 0 points  Total Score 0 points 0 points 4 points 0 points    Immunizations Immunization History  Administered Date(s) Administered   Fluad Quad(high Dose 65+) 12/15/2015, 12/03/2018, 11/25/2021   Influenza Split 12/11/2013, 12/20/2016   Influenza, High Dose Seasonal PF 12/14/2014, 12/15/2015, 12/13/2017, 12/13/2017, 12/08/2020, 12/22/2022   Influenza-Unspecified 12/15/2015   Moderna Covid-19 Fall Seasonal Vaccine 60yrs & older 12/22/2022   Moderna Sars-Covid-2 Vaccination 04/08/2019, 05/06/2019, 12/29/2019   Pneumococcal Conjugate-13 06/16/2015   Pneumococcal Polysaccharide-23 03/06/2005   Pneumococcal-Unspecified 03/06/2014   Respiratory Syncytial Virus Vaccine,Recomb Aduvanted(Arexvy) 02/22/2022   Td 03/06/2010   Zoster Recombinant(Shingrix) 08/16/2020, 11/18/2020    Screening Tests Health Maintenance  Topic Date Due   COVID-19 Vaccine (5 - 2024-25 season) 06/22/2023   Medicare Annual Wellness (AWV)  07/07/2023   INFLUENZA VACCINE  10/05/2023   MAMMOGRAM  12/11/2023   DEXA SCAN  03/24/2024   Pneumonia Vaccine 59+ Years old  Completed   Zoster Vaccines- Shingrix  Completed   HPV VACCINES  Aged Out   Meningococcal B Vaccine  Aged Out   DTaP/Tdap/Td  Discontinued   Colonoscopy  Discontinued   Hepatitis C Screening  Discontinued    Health Maintenance  Health Maintenance Due  Topic Date Due   COVID-19 Vaccine (5 - 2024-25 season) 06/22/2023   Medicare Annual Wellness (AWV)  07/07/2023   Health Maintenance Items Addressed: See Nurse Notes  Additional Screening:  Vision Screening: Recommended annual ophthalmology exams for early detection of glaucoma and other disorders of the eye.  Dental Screening: Recommended annual dental exams for proper oral hygiene  Community Resource Referral / Chronic Care Management: CRR required this visit?  No   CCM required this visit?  No     Plan:     I have personally reviewed and noted the following in the  patient's chart:   Medical and social history Use of alcohol, tobacco or illicit drugs  Current medications and supplements including opioid prescriptions. Patient is not currently taking opioid prescriptions. Functional ability and status Nutritional status Physical activity Advanced directives List of other physicians Hospitalizations, surgeries, and ER visits in previous 12 months Vitals Screenings to include cognitive, depression, and falls Referrals and appointments  In addition, I have reviewed and discussed with patient certain preventive protocols, quality metrics, and best practice recommendations. A written personalized care plan for preventive services as well as general preventive health recommendations were provided to patient.     Michaelle Adolphus, CMA   07/10/2023   After Visit Summary: (MyChart) Due to this being a telephonic visit, the after visit summary with patients personalized plan was offered to patient via MyChart   Notes: Nothing significant to report at this time.

## 2023-07-11 DIAGNOSIS — M5459 Other low back pain: Secondary | ICD-10-CM | POA: Diagnosis not present

## 2023-07-16 ENCOUNTER — Ambulatory Visit: Admitting: Family Medicine

## 2023-07-16 DIAGNOSIS — M5459 Other low back pain: Secondary | ICD-10-CM | POA: Diagnosis not present

## 2023-07-18 DIAGNOSIS — M5459 Other low back pain: Secondary | ICD-10-CM | POA: Diagnosis not present

## 2023-07-19 ENCOUNTER — Ambulatory Visit: Admitting: Physical Medicine and Rehabilitation

## 2023-07-23 DIAGNOSIS — M5459 Other low back pain: Secondary | ICD-10-CM | POA: Diagnosis not present

## 2023-07-26 ENCOUNTER — Encounter: Payer: Self-pay | Admitting: Physical Medicine and Rehabilitation

## 2023-07-26 ENCOUNTER — Ambulatory Visit: Admitting: Physical Medicine and Rehabilitation

## 2023-07-26 DIAGNOSIS — M5441 Lumbago with sciatica, right side: Secondary | ICD-10-CM | POA: Diagnosis not present

## 2023-07-26 DIAGNOSIS — G8929 Other chronic pain: Secondary | ICD-10-CM | POA: Diagnosis not present

## 2023-07-26 DIAGNOSIS — M48061 Spinal stenosis, lumbar region without neurogenic claudication: Secondary | ICD-10-CM | POA: Diagnosis not present

## 2023-07-26 DIAGNOSIS — M5442 Lumbago with sciatica, left side: Secondary | ICD-10-CM | POA: Diagnosis not present

## 2023-07-26 DIAGNOSIS — M5416 Radiculopathy, lumbar region: Secondary | ICD-10-CM

## 2023-07-26 NOTE — Progress Notes (Unsigned)
 MRI review

## 2023-07-26 NOTE — Progress Notes (Unsigned)
 Cynthia Garrett - 83 y.o. female MRN [id number]  Date of birth: August 10, 1940  Office Visit Note: Visit Date: 07/26/2023 PCP: Roise Cleaver, MD Referred by: Roise Cleaver, MD  Subjective: Chief Complaint  Patient presents with   Follow-up    MRI review   HPI: Cynthia Garrett is a 83 y.o. female who comes in today for evaluation of chronic, worsening and severe bilateral lower back pain radiating to hips. Pain ongoing for several years. Her pain is constant, worsens with activity and movement. Her pain worsens with household tasks such as unloading dishwasher and folding clothes. She describes her pain as dull and aching sensation, currently rates as 4 out of 10. Some relief of pain with home exercise regimen, rest and use of medications. Recently completed course of formal physical therapy with short term relief of pain. Recent lumbar MRI imaging shows moderate multifactorial spinal and bilateral lateral recess stenosis at L4-L5. There is 7 mm degenerative anterolisthesis at L4. No history of lumbar surgery/injections. She is here today for lumbar MRI review. Patient denies focal weakness, numbness and tingling. No recent trauma or falls.       Review of Systems  Musculoskeletal:  Positive for back pain.  Neurological:  Negative for tingling, sensory change, focal weakness and weakness.  All other systems reviewed and are negative.  Otherwise per HPI.  Assessment & Plan: Visit Diagnoses:    ICD-10-CM   1. Chronic bilateral low back pain with bilateral sciatica  M54.42 Ambulatory referral to Physical Medicine Rehab   M54.41    G89.29     2. Radiculopathy, lumbar region  M54.16 Ambulatory referral to Physical Medicine Rehab    3. Spinal stenosis of lumbar region, unspecified whether neurogenic claudication present  M48.061 Ambulatory referral to Physical Medicine Rehab       Plan: Findings:  Chronic, worsening and severe bilateral lower back pain radiating to hips. Patient  continues to have severe pain despite good conservative therapies such as formal physical therapy, home exercise regimen, rest and use of medications. I discussed recent lumbar MRI with patient today using imaging and spine model. Patients clinical presentation and exam are consistent with lumbar radiculopathy. There is moderate multifactorial central canal and lateral recess stenosis at L4-L5, there is also degenerative anterolisthesis at this level. We discussed treatment plan in detail today. Next step is to perform diagnostic and hopefully therapeutic L4-L5 interlaminar epidural steroid injection under fluoroscopic guidance. If good relief of pain with this injection we can repeat this procedure infrequently as needed. Dr. Daisey Dryer at bedside to discuss injection procedure, she has no questions at this time. No red flag symptoms noted upon exam today.     Meds & Orders: No orders of the defined types were placed in this encounter.   Orders Placed This Encounter  Procedures   Ambulatory referral to Physical Medicine Rehab    Follow-up: Return for L4-L5 interlaminar epidural steroid injection.   Procedures: No procedures performed      Clinical History: CLINICAL DATA:  Chronic low back pain worsening over the past 6 months.   EXAM: MRI LUMBAR SPINE WITHOUT CONTRAST   TECHNIQUE: Multiplanar, multisequence MR imaging of the lumbar spine was performed. No intravenous contrast was administered.   COMPARISON:  Radiographs 09/07/2021   FINDINGS: Segmentation: There are five lumbar type vertebral bodies. The last full intervertebral disc space is labeled L5-S1. This correlates with the lumbar radiographs.   Alignment:  Degenerative anterolisthesis of L4 estimated at 7 mm.  Vertebrae:  No fracture, evidence of discitis, or bone lesion.   Conus medullaris and cauda equina: Conus extends to the T12-L1 level. Conus and cauda equina appear normal.   Paraspinal and other soft tissues: No  significant paraspinal or retroperitoneal findings. No worrisome renal lesions, aortic aneurysm or retroperitoneal adenopathy.   Disc levels:   T12-L1: No significant findings.   L1-2: Mild bulging annulus, osteophytic ridging and mild to moderate facet disease contributing to mild bilateral lateral recess stenosis but no significant spinal or foraminal stenosis.   L2-3: Advanced degenerative disc disease with marked disc space narrowing. Broad-based bulging desiccated disc and osteophytic ridging in combination with moderate facet disease contributes to mild/moderate spinal and bilateral lateral recess stenosis. No foraminal stenosis.   L3-4: Bulging degenerated annulus, osteophytic ridging and facet disease contributing to mild spinal and bilateral lateral recess stenosis. No significant foraminal stenosis.   L4-5: Bulging and uncovered disc along with osteophytic ridging and severe facet disease contributing to moderate spinal and bilateral lateral recess stenosis. No significant foraminal stenosis.   L5-S1: Advanced left-sided facet disease and bulging uncovered disc with small central disc protrusion. No significant neural compression, spinal or foraminal stenosis.   IMPRESSION: 1. Degenerative anterolisthesis of L4 estimated at 7 mm. 2. Mild bilateral lateral recess stenosis at L1-2. 3. Mild/moderate multifactorial spinal and bilateral lateral recess stenosis at L2-3. 4. Mild multifactorial spinal and bilateral lateral recess stenosis at L3-4. 5. Moderate multifactorial spinal and bilateral lateral recess stenosis at L4-5. 6. Advanced left-sided facet disease and bulging uncovered disc with small central disc protrusion at L5-S1. No significant neural compression, spinal or foraminal stenosis.     Electronically Signed   By: Marrian Siva M.D.   On: 07/24/2023 17:42   She reports that she quit smoking about 50 years ago. Her smoking use included cigarettes. She  started smoking about 60 years ago. She has a 2.5 pack-year smoking history. She has never used smokeless tobacco. No results for input(s): "HGBA1C", "LABURIC" in the last 8760 hours.  Objective:  VS:  HT:    WT:   BMI:     BP:   HR: bpm  TEMP: ( )  RESP:  Physical Exam Vitals and nursing note reviewed.  HENT:     Head: Normocephalic and atraumatic.     Right Ear: External ear normal.     Left Ear: External ear normal.     Nose: Nose normal.     Mouth/Throat:     Mouth: Mucous membranes are moist.  Eyes:     Extraocular Movements: Extraocular movements intact.  Cardiovascular:     Rate and Rhythm: Normal rate.     Pulses: Normal pulses.  Pulmonary:     Effort: Pulmonary effort is normal.  Abdominal:     General: Abdomen is flat. There is no distension.  Musculoskeletal:        General: Tenderness present.     Cervical back: Normal range of motion.     Comments: Patient is slow to rise from seated position to standing. Good lumbar range of motion. No pain noted with facet loading. 5/5 strength noted with bilateral hip flexion, knee flexion/extension, ankle dorsiflexion/plantarflexion and EHL. No clonus noted bilaterally. No pain upon palpation of greater trochanters. No pain with internal/external rotation of bilateral hips. Sensation intact bilaterally. Negative slump test bilaterally. Ambulates without aid, gait steady.     Skin:    General: Skin is warm and dry.     Capillary Refill: Capillary refill  takes less than 2 seconds.  Neurological:     General: No focal deficit present.     Mental Status: She is alert and oriented to person, place, and time.  Psychiatric:        Mood and Affect: Mood normal.        Behavior: Behavior normal.     Ortho Exam  Imaging: No results found.  Past Medical/Family/Surgical/Social History: Medications & Allergies reviewed per EMR, new medications updated. 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/17/2015   Past Medical History:  Diagnosis Date   Allergic rhinitis, seasonal    Allergy    Anxiety    Arthritis    Cataract    extraction   Headache(784.0)    Hyperlipidemia    Hypothyroidism    Left breast mass    Lung nodules    Osteopenia    PONV (postoperative nausea and vomiting)    Thyroid  disease    Family History  Problem Relation Age of Onset   Heart disease Mother    Hypertension Mother    Diabetes Sister    Hypertension Sister    COPD Brother    Anesthesia problems Neg Hx    Hypotension Neg Hx    Malignant hyperthermia Neg Hx    Pseudochol deficiency Neg Hx    Colon cancer Neg Hx    Cancer Neg Hx    Breast cancer Neg Hx    Past Surgical History:  Procedure Laterality Date   BREAST EXCISIONAL BIOPSY Left 2018   BREAST LUMPECTOMY WITH RADIOACTIVE SEED LOCALIZATION Left 12/06/2016   Procedure: LEFT BREAST LUMPECTOMY WITH RADIOACTIVE SEED LOCALIZATION;  Surgeon: Boyce Byes, MD;  Location: [facility];  Service: General;  Laterality: Left;   BREAST SURGERY     BUNIONECTOMY Right 04/04/2021   5th toe   CATARACT EXTRACTION W/PHACO  06/26/2011   Procedure: CATARACT EXTRACTION PHACO AND INTRAOCULAR LENS PLACEMENT (IOC);  Surgeon: Anner Kill, MD;  Location: [facility];  Service: Ophthalmology;  Laterality: Right;  CDE 10.34   CATARACT EXTRACTION W/PHACO  07/06/2011   Procedure: CATARACT EXTRACTION PHACO AND INTRAOCULAR LENS PLACEMENT (IOC);  Surgeon: Anner Kill, MD;  Location: [facility];  Service: Ophthalmology;  Laterality: Left;  CDE:9.92   COLONOSCOPY      EYE SURGERY     Hysterotomy     fibroid tumor   Social History   Occupational History    Employer: RETIRED  Tobacco Use   Smoking status: Former    Current packs/day: 0.00    Average packs/day: 0.3 packs/day for 10.0 years (2.5 ttl pk-yrs)    Types: Cigarettes    Start date: 06/08/1963    Quit date: 06/07/1973    Years since quitting: 50.1   Smokeless tobacco: Never   Tobacco comments:    Significant second-hand exposure.  Vaping Use   Vaping status: Never Used  Substance and Sexual Activity   Alcohol use: Never    Comment: social   Drug use: No   Sexual activity: Yes    Birth control/protection: Post-menopausal

## 2023-07-31 ENCOUNTER — Other Ambulatory Visit: Payer: Self-pay | Admitting: Physical Medicine and Rehabilitation

## 2023-07-31 MED ORDER — DIAZEPAM 5 MG PO TABS: ORAL_TABLET | ORAL | 0 refills | Status: DC

## 2023-08-07 DIAGNOSIS — R350 Frequency of micturition: Secondary | ICD-10-CM | POA: Diagnosis not present

## 2023-08-08 DIAGNOSIS — M5459 Other low back pain: Secondary | ICD-10-CM | POA: Diagnosis not present

## 2023-08-13 DIAGNOSIS — M5459 Other low back pain: Secondary | ICD-10-CM | POA: Diagnosis not present

## 2023-08-15 DIAGNOSIS — M5459 Other low back pain: Secondary | ICD-10-CM | POA: Diagnosis not present

## 2023-08-27 ENCOUNTER — Ambulatory Visit: Admitting: Physical Medicine and Rehabilitation

## 2023-08-27 ENCOUNTER — Other Ambulatory Visit: Payer: Self-pay

## 2023-08-27 VITALS — BP 148/90 | HR 87

## 2023-08-27 DIAGNOSIS — M5416 Radiculopathy, lumbar region: Secondary | ICD-10-CM | POA: Diagnosis not present

## 2023-08-27 MED ORDER — METHYLPREDNISOLONE ACETATE 40 MG/ML IJ SUSP
40.0000 mg | Freq: Once | INTRAMUSCULAR | Status: AC
  Administered 2023-08-27: 40 mg

## 2023-08-27 NOTE — Progress Notes (Signed)
 Pain Scale   Average Pain 4 Patient advising she has chronic lower back pain and pain increases when doing daily chores. Patient advising she has a decrease in lower back pain when sitting and lying down        +Driver, -BT, -Dye Allergies.

## 2023-08-27 NOTE — Progress Notes (Signed)
 Cynthia Garrett - 83 y.o. female MRN [id number]  Date of birth: 01/07/1941  Office Visit Note: Visit Date: 08/27/2023 PCP: Zollie Lowers, MD Referred by: Zollie Lowers, MD  Subjective: Chief Complaint  Patient presents with   Lower Back - Pain   HPI:  Cynthia Garrett is a 83 y.o. female who comes in today at the request of Duwaine Pouch, FNP for planned Left L4-5 Lumbar Interlaminar epidural steroid injection with fluoroscopic guidance.  The patient has failed conservative care including home exercise, medications, time and activity modification.  This injection will be diagnostic and hopefully therapeutic.  Please see requesting physician notes for further details and justification.   ROS Otherwise per HPI.  Assessment & Plan: Visit Diagnoses:    ICD-10-CM   1. Radiculopathy, lumbar region  M54.16 XR C-ARM NO REPORT    Epidural Steroid injection    methylPREDNISolone acetate (DEPO-MEDROL) injection 40 mg      Plan: No additional findings.   Meds & Orders:  Meds ordered this encounter  Medications   methylPREDNISolone acetate (DEPO-MEDROL) injection 40 mg    Orders Placed This Encounter  Procedures   XR C-ARM NO REPORT   Epidural Steroid injection    Follow-up: Return for visit to requesting provider as needed.   Procedures: No procedures performed  Lumbar Epidural Steroid Injection - Interlaminar Approach with Fluoroscopic Guidance  Patient: Cynthia Garrett      Date of Birth: 1940-08-24 MRN: [id number] PCP: Zollie Lowers, MD      Visit Date: 08/27/2023   Universal Protocol:     Consent Given By: the patient  Position: PRONE  Additional Comments: Vital signs were monitored before and after the procedure. Patient was prepped and draped in the usual sterile fashion. The correct patient, procedure, and site was verified.   Injection Procedure Details:   Procedure diagnoses: Radiculopathy, lumbar region [M54.16]   Meds Administered:  Meds ordered  this encounter  Medications   methylPREDNISolone acetate (DEPO-MEDROL) injection 40 mg     Laterality: Left  Location/Site:  L4-5  Needle: 3.5 in., 20 ga. Tuohy  Needle Placement: Paramedian epidural  Findings:   -Comments: Excellent flow of contrast into the epidural space.  Procedure Details: Using a paramedian approach from the side mentioned above, the region overlying the inferior lamina was localized under fluoroscopic visualization and the soft tissues overlying this structure were infiltrated with 4 ml. of 1% Lidocaine  without Epinephrine . The Tuohy needle was inserted into the epidural space using a paramedian approach.   The epidural space was localized using loss of resistance along with counter oblique bi-planar fluoroscopic views.  After negative aspirate for air, blood, and CSF, a 2 ml. volume of Isovue-250 was injected into the epidural space and the flow of contrast was observed. Radiographs were obtained for documentation purposes.    The injectate was administered into the level noted above.   Additional Comments:  No complications occurred Dressing: 2 x 2 sterile gauze and Band-Aid    Post-procedure details: Patient was observed during the procedure. Post-procedure instructions were reviewed.  Patient left the clinic in stable condition.   Clinical History: CLINICAL DATA:  Chronic low back pain worsening over the past 6 months.   EXAM: MRI LUMBAR SPINE WITHOUT CONTRAST   TECHNIQUE: Multiplanar, multisequence MR imaging of the lumbar spine was performed. No intravenous contrast was administered.   COMPARISON:  Radiographs 09/07/2021   FINDINGS: Segmentation: There are five lumbar type vertebral bodies. The last full intervertebral disc space  is labeled L5-S1. This correlates with the lumbar radiographs.   Alignment:  Degenerative anterolisthesis of L4 estimated at 7 mm.   Vertebrae:  No fracture, evidence of discitis, or bone lesion.   Conus  medullaris and cauda equina: Conus extends to the T12-L1 level. Conus and cauda equina appear normal.   Paraspinal and other soft tissues: No significant paraspinal or retroperitoneal findings. No worrisome renal lesions, aortic aneurysm or retroperitoneal adenopathy.   Disc levels:   T12-L1: No significant findings.   L1-2: Mild bulging annulus, osteophytic ridging and mild to moderate facet disease contributing to mild bilateral lateral recess stenosis but no significant spinal or foraminal stenosis.   L2-3: Advanced degenerative disc disease with marked disc space narrowing. Broad-based bulging desiccated disc and osteophytic ridging in combination with moderate facet disease contributes to mild/moderate spinal and bilateral lateral recess stenosis. No foraminal stenosis.   L3-4: Bulging degenerated annulus, osteophytic ridging and facet disease contributing to mild spinal and bilateral lateral recess stenosis. No significant foraminal stenosis.   L4-5: Bulging and uncovered disc along with osteophytic ridging and severe facet disease contributing to moderate spinal and bilateral lateral recess stenosis. No significant foraminal stenosis.   L5-S1: Advanced left-sided facet disease and bulging uncovered disc with small central disc protrusion. No significant neural compression, spinal or foraminal stenosis.   IMPRESSION: 1. Degenerative anterolisthesis of L4 estimated at 7 mm. 2. Mild bilateral lateral recess stenosis at L1-2. 3. Mild/moderate multifactorial spinal and bilateral lateral recess stenosis at L2-3. 4. Mild multifactorial spinal and bilateral lateral recess stenosis at L3-4. 5. Moderate multifactorial spinal and bilateral lateral recess stenosis at L4-5. 6. Advanced left-sided facet disease and bulging uncovered disc with small central disc protrusion at L5-S1. No significant neural compression, spinal or foraminal stenosis.     Electronically Signed    By: MYRTIS Stammer M.D.   On: 07/24/2023 17:42     Objective:  VS:  HT:    WT:   BMI:     BP:(!) 148/90  HR:87bpm  TEMP: ( )  RESP:  Physical Exam Vitals and nursing note reviewed.  Constitutional:      General: She is not in acute distress.    Appearance: Normal appearance. She is not ill-appearing.  HENT:     Head: Normocephalic and atraumatic.     Right Ear: External ear normal.     Left Ear: External ear normal.   Eyes:     Extraocular Movements: Extraocular movements intact.    Cardiovascular:     Rate and Rhythm: Normal rate.     Pulses: Normal pulses.  Pulmonary:     Effort: Pulmonary effort is normal. No respiratory distress.  Abdominal:     General: There is no distension.     Palpations: Abdomen is soft.   Musculoskeletal:        General: Tenderness present.     Cervical back: Neck supple.     Right lower leg: No edema.     Left lower leg: No edema.     Comments: Patient has good distal strength with no pain over the greater trochanters.  No clonus or focal weakness.   Skin:    Findings: No erythema, lesion or rash.   Neurological:     General: No focal deficit present.     Mental Status: She is alert and oriented to person, place, and time.     Sensory: No sensory deficit.     Motor: No weakness or abnormal muscle tone.  Coordination: Coordination normal.   Psychiatric:        Mood and Affect: Mood normal.        Behavior: Behavior normal.      Imaging: No results found.

## 2023-08-27 NOTE — Patient Instructions (Signed)

## 2023-08-27 NOTE — Procedures (Signed)
 Lumbar Epidural Steroid Injection - Interlaminar Approach with Fluoroscopic Guidance  Patient: Cynthia Garrett      Date of Birth: [birth year] MRN: [id number] PCP: Zollie Lowers, MD      Visit Date: 08/27/2023   Universal Protocol:     Consent Given By: the patient  Position: PRONE  Additional Comments: Vital signs were monitored before and after the procedure. Patient was prepped and draped in the usual sterile fashion. The correct patient, procedure, and site was verified.   Injection Procedure Details:   Procedure diagnoses: Radiculopathy, lumbar region [M54.16]   Meds Administered:  Meds ordered this encounter  Medications   methylPREDNISolone acetate (DEPO-MEDROL) injection 40 mg     Laterality: Left  Location/Site:  L4-5  Needle: 3.5 in., 20 ga. Tuohy  Needle Placement: Paramedian epidural  Findings:   -Comments: Excellent flow of contrast into the epidural space.  Procedure Details: Using a paramedian approach from the side mentioned above, the region overlying the inferior lamina was localized under fluoroscopic visualization and the soft tissues overlying this structure were infiltrated with 4 ml. of 1% Lidocaine  without Epinephrine . The Tuohy needle was inserted into the epidural space using a paramedian approach.   The epidural space was localized using loss of resistance along with counter oblique bi-planar fluoroscopic views.  After negative aspirate for air, blood, and CSF, a 2 ml. volume of Isovue-250 was injected into the epidural space and the flow of contrast was observed. Radiographs were obtained for documentation purposes.    The injectate was administered into the level noted above.   Additional Comments:  No complications occurred Dressing: 2 x 2 sterile gauze and Band-Aid    Post-procedure details: Patient was observed during the procedure. Post-procedure instructions were reviewed.  Patient left the clinic in stable condition.

## 2023-09-03 ENCOUNTER — Encounter: Payer: Self-pay | Admitting: Family Medicine

## 2023-09-03 ENCOUNTER — Ambulatory Visit (INDEPENDENT_AMBULATORY_CARE_PROVIDER_SITE_OTHER): Admitting: Family Medicine

## 2023-09-03 VITALS — BP 125/80 | HR 83 | Temp 98.2°F | Ht 62.0 in | Wt 132.0 lb

## 2023-09-03 DIAGNOSIS — E039 Hypothyroidism, unspecified: Secondary | ICD-10-CM | POA: Diagnosis not present

## 2023-09-03 DIAGNOSIS — F514 Sleep terrors [night terrors]: Secondary | ICD-10-CM | POA: Diagnosis not present

## 2023-09-03 DIAGNOSIS — E782 Mixed hyperlipidemia: Secondary | ICD-10-CM

## 2023-09-03 MED ORDER — PRAVASTATIN SODIUM 40 MG PO TABS: 40.0000 mg | ORAL_TABLET | Freq: Every day | ORAL | 3 refills | Status: AC

## 2023-09-03 MED ORDER — SYNTHROID 75 MCG PO TABS
ORAL_TABLET | ORAL | 2 refills | Status: AC
Start: 2023-09-03 — End: ?

## 2023-09-03 MED ORDER — MIRTAZAPINE 15 MG PO TABS: ORAL_TABLET | ORAL | 3 refills | Status: AC

## 2023-09-03 MED ORDER — HYDROCHLOROTHIAZIDE 12.5 MG PO CAPS: ORAL_CAPSULE | ORAL | 3 refills | Status: AC

## 2023-09-03 MED ORDER — DESVENLAFAXINE SUCCINATE ER 50 MG PO TB24: 50.0000 mg | ORAL_TABLET | Freq: Every day | ORAL | 2 refills | Status: DC

## 2023-09-03 MED ORDER — TRAMADOL HCL 50 MG PO TABS: 50.0000 mg | ORAL_TABLET | Freq: Four times a day (QID) | ORAL | 0 refills | Status: AC

## 2023-09-03 NOTE — Progress Notes (Signed)
 Subjective:  Patient ID: Cynthia Garrett, female    DOB: June 06, 1940  Age: 83 y.o. MRN: [id number]  CC: Medical Management of Chronic Issues   HPI [name] presents for low back pain 5/10. Muscle relaxer made her too drowsy.   follow-up on  thyroid . The patient has a history of hypothyroidism for many years. It has been stable recently. Pt. denies any change in  voice, loss of hair, heat or cold intolerance. Energy level is ZERO. Patient denies constipation and diarrhea. No myxedema. Medication is as noted below. Verified that pt is taking it daily on an empty stomach. Well tolerated.      09/03/2023   10:31 AM 07/10/2023   10:37 AM 05/31/2023   11:39 AM  Depression screen PHQ 2/9  Decreased Interest 1 2   Down, Depressed, Hopeless 1 1   PHQ - 2 Score 2 3   Altered sleeping 1 2   Tired, decreased energy 1 3   Change in appetite 1 2   Feeling bad or failure about yourself  0 2   Trouble concentrating 0 0   Moving slowly or fidgety/restless 1 3   Suicidal thoughts 0 0   PHQ-9 Score 6 15   Difficult doing work/chores  Somewhat difficult Not difficult at all    History Victor has a past medical history of Allergic rhinitis, seasonal, Allergy, Anxiety, Arthritis, Cataract, Headache(784.0), Hyperlipidemia, Hypothyroidism, Left breast mass, Lung nodules, Osteopenia, PONV (postoperative nausea and vomiting), and Thyroid  disease.   She has a past surgical history that includes Hysterotomy; Colonoscopy; Cataract extraction w/PHACO (06/26/2011); Cataract extraction w/PHACO (07/06/2011); Breast lumpectomy with radioactive seed localization (Left, 12/06/2016); Breast excisional biopsy (Left, 2018); Bunionectomy (Right, 04/04/2021); Eye surgery; and Breast surgery.   Her family history includes COPD in her brother; Diabetes in her sister; Heart disease in her mother; Hypertension in her mother and sister.She reports that she quit smoking about 50 years ago. Her smoking use included  cigarettes. She started smoking about 60 years ago. She has a 2.5 pack-year smoking history. She has never used smokeless tobacco. She reports that she does not drink alcohol and does not use drugs.    ROS Review of Systems  Constitutional: Negative.   HENT:  Negative for congestion.   Eyes:  Negative for visual disturbance.  Respiratory:  Negative for shortness of breath and wheezing.   Cardiovascular:  Negative for chest pain.  Gastrointestinal:  Negative for abdominal pain, constipation, diarrhea, nausea and vomiting.  Genitourinary:  Negative for difficulty urinating.  Musculoskeletal:  Positive for arthralgias and back pain. Negative for myalgias.  Neurological:  Negative for headaches.  Psychiatric/Behavioral:  Negative for sleep disturbance.     Objective:  BP 125/80   Pulse 83   Temp 98.2 F (36.8 C)   Ht 5' 2 (1.575 m)   Wt 132 lb (59.9 kg)   SpO2 97%   BMI 24.14 kg/m   BP Readings from Last 3 Encounters:  09/03/23 125/80  08/27/23 (!) 148/90  07/10/23 133/73    Wt Readings from Last 3 Encounters:  09/03/23 132 lb (59.9 kg)  07/10/23 136 lb (61.7 kg)  06/13/23 136 lb (61.7 kg)     Physical Exam Constitutional:      General: She is not in acute distress.    Appearance: She is well-developed.   Cardiovascular:     Rate and Rhythm: Normal rate and regular rhythm.  Pulmonary:     Breath sounds: Normal breath sounds.  Musculoskeletal:        General: Normal range of motion.   Skin:    General: Skin is warm and dry.   Neurological:     Mental Status: She is alert and oriented to person, place, and time.      Assessment & Plan:  Hypothyroidism, unspecified type -     Synthroid ; TAKE 1 TABLET (75 MCG TOTAL) BY MOUTH DAILY BEFORE BREAKFAST. NAME BRAND ONLY  Dispense: 90 tablet; Refill: 2 -     Lipid panel -     CBC with Differential/Platelet -     CMP14+EGFR  Night terrors, adult -     Mirtazapine ; 1/2 to 1 pill at bedtime For sleep  Dispense:  90 tablet; Refill: 3 -     Desvenlafaxine  Succinate ER; Take 1 tablet (50 mg total) by mouth daily.  Dispense: 30 tablet; Refill: 2  Mixed hyperlipidemia -     Pravastatin  Sodium; Take 1 tablet (40 mg total) by mouth daily.  Dispense: 90 tablet; Refill: 3 -     Lipid panel -     CBC with Differential/Platelet -     CMP14+EGFR  Other orders -     hydroCHLOROthiazide ; TAKE ONE CAPSULE DAILY AS NEEDED FOR SWELLING  Dispense: 90 capsule; Refill: 3 -     traMADol  HCl; Take 1 tablet (50 mg total) by mouth 4 (four) times daily for 5 days. 1-2 tablets up to 4 times a day as needed for pain  Dispense: 20 tablet; Refill: 0     Follow-up: Return in about 6 months (around 03/04/2024).  Butler Der, M.D.

## 2023-09-05 ENCOUNTER — Other Ambulatory Visit

## 2023-09-05 DIAGNOSIS — E782 Mixed hyperlipidemia: Secondary | ICD-10-CM | POA: Diagnosis not present

## 2023-09-05 DIAGNOSIS — E039 Hypothyroidism, unspecified: Secondary | ICD-10-CM | POA: Diagnosis not present

## 2023-09-05 LAB — LIPID PANEL

## 2023-09-06 LAB — LIPID PANEL
Cholesterol, Total: 157 mg/dL (ref 100–199)
HDL: 62 mg/dL (ref >39)
LDL CALC COMMENT:: 2.5 ratio (ref 0.0–4.4)
LDL Chol Calc (NIH): 73 mg/dL (ref 0–99)
Triglycerides: 129 mg/dL (ref 0–149)
VLDL Cholesterol Cal: 22 mg/dL (ref 5–40)

## 2023-09-06 LAB — CBC WITH DIFFERENTIAL/PLATELET
Basophils Absolute: 0.1 10*3/uL (ref 0.0–0.2)
Basos: 1 %
EOS (ABSOLUTE): 0.1 10*3/uL (ref 0.0–0.4)
Eos: 1 %
Hematocrit: 40.4 % (ref 34.0–46.6)
Hemoglobin: 13.4 g/dL (ref 11.1–15.9)
Immature Grans (Abs): 0 10*3/uL (ref 0.0–0.1)
Immature Granulocytes: 0 %
Lymphocytes Absolute: 2.2 10*3/uL (ref 0.7–3.1)
Lymphs: 25 %
MCH: 31.6 pg (ref 26.6–33.0)
MCHC: 33.2 g/dL (ref 31.5–35.7)
MCV: 95 fL (ref 79–97)
Monocytes Absolute: 0.6 10*3/uL (ref 0.1–0.9)
Monocytes: 6 %
Neutrophils Absolute: 5.8 10*3/uL (ref 1.4–7.0)
Neutrophils: 67 %
Platelets: 267 10*3/uL (ref 150–450)
RBC: 4.24 x10E6/uL (ref 3.77–5.28)
RDW: 12.1 % (ref 11.7–15.4)
WBC: 8.7 10*3/uL (ref 3.4–10.8)

## 2023-09-06 LAB — CMP14+EGFR
ALT: 25 IU/L (ref 0–32)
AST: 29 IU/L (ref 0–40)
Albumin: 4.8 g/dL — AB (ref 3.7–4.7)
Alkaline Phosphatase: 75 IU/L (ref 44–121)
BUN/Creatinine Ratio: 30 — AB (ref 12–28)
BUN: 24 mg/dL (ref 8–27)
Bilirubin Total: 0.4 mg/dL (ref 0.0–1.2)
CO2: 23 mmol/L (ref 20–29)
Calcium: 10 mg/dL (ref 8.7–10.3)
Chloride: 101 mmol/L (ref 96–106)
Creatinine, Ser: 0.81 mg/dL (ref 0.57–1.00)
Globulin, Total: 2.5 g/dL (ref 1.5–4.5)
Glucose: 95 mg/dL (ref 70–99)
Potassium: 4 mmol/L (ref 3.5–5.2)
Sodium: 142 mmol/L (ref 134–144)
Total Protein: 7.3 g/dL (ref 6.0–8.5)
eGFR: 72 mL/min/{1.73_m2} (ref >59)

## 2023-09-10 ENCOUNTER — Telehealth: Payer: Self-pay | Admitting: Physical Medicine and Rehabilitation

## 2023-09-10 NOTE — Telephone Encounter (Signed)
 Pt called stating the injection did nothing and need to set an follow up apt. Please call pt at [phone]

## 2023-09-13 ENCOUNTER — Ambulatory Visit: Payer: Self-pay | Admitting: Family Medicine

## 2023-09-13 NOTE — Progress Notes (Signed)
Hello Cynthia Garrett,  Your lab result is normal and/or stable.Some minor variations that are not significant are commonly marked abnormal, but do not represent any medical problem for you.  Best regards, Warren Stacks, M.D.

## 2023-09-15 ENCOUNTER — Emergency Department (HOSPITAL_BASED_OUTPATIENT_CLINIC_OR_DEPARTMENT_OTHER)

## 2023-09-15 ENCOUNTER — Emergency Department (HOSPITAL_BASED_OUTPATIENT_CLINIC_OR_DEPARTMENT_OTHER): Admission: EM | Admit: 2023-09-15 | Discharge: 2023-09-15 | Disposition: A | Discharge status: Home / Self Care

## 2023-09-15 ENCOUNTER — Other Ambulatory Visit: Payer: Self-pay

## 2023-09-15 ENCOUNTER — Emergency Department (HOSPITAL_BASED_OUTPATIENT_CLINIC_OR_DEPARTMENT_OTHER): Admitting: Radiology

## 2023-09-15 DIAGNOSIS — M25511 Pain in right shoulder: Secondary | ICD-10-CM | POA: Diagnosis present

## 2023-09-15 DIAGNOSIS — Y9301 Activity, walking, marching and hiking: Secondary | ICD-10-CM | POA: Insufficient documentation

## 2023-09-15 DIAGNOSIS — Y92008 Other place in unspecified non-institutional (private) residence as the place of occurrence of the external cause: Secondary | ICD-10-CM | POA: Insufficient documentation

## 2023-09-15 DIAGNOSIS — S01412A Laceration without foreign body of left cheek and temporomandibular area, initial encounter: Secondary | ICD-10-CM | POA: Insufficient documentation

## 2023-09-15 DIAGNOSIS — S0990XA Unspecified injury of head, initial encounter: Secondary | ICD-10-CM | POA: Diagnosis not present

## 2023-09-15 DIAGNOSIS — S42291A Other displaced fracture of upper end of right humerus, initial encounter for closed fracture: Secondary | ICD-10-CM

## 2023-09-15 DIAGNOSIS — S42211A Unspecified displaced fracture of surgical neck of right humerus, initial encounter for closed fracture: Secondary | ICD-10-CM | POA: Diagnosis not present

## 2023-09-15 DIAGNOSIS — W01190A Fall on same level from slipping, tripping and stumbling with subsequent striking against furniture, initial encounter: Secondary | ICD-10-CM | POA: Insufficient documentation

## 2023-09-15 DIAGNOSIS — S01112A Laceration without foreign body of left eyelid and periocular area, initial encounter: Secondary | ICD-10-CM | POA: Diagnosis not present

## 2023-09-15 DIAGNOSIS — I672 Cerebral atherosclerosis: Secondary | ICD-10-CM | POA: Diagnosis not present

## 2023-09-15 DIAGNOSIS — I6782 Cerebral ischemia: Secondary | ICD-10-CM | POA: Diagnosis not present

## 2023-09-15 MED ORDER — OXYCODONE-ACETAMINOPHEN 5-325 MG PO TABS
1.0000 | ORAL_TABLET | Freq: Once | ORAL | Status: AC
  Administered 2023-09-15: 1 via ORAL
  Filled 2023-09-15: qty 1

## 2023-09-15 MED ORDER — OXYCODONE HCL 5 MG PO TABS: 5.0000 mg | ORAL_TABLET | Freq: Four times a day (QID) | ORAL | 0 refills | Status: DC | PRN

## 2023-09-15 NOTE — Discharge Instructions (Addendum)
 Your x-rays today shows that you have a fracture of your right humeral head which is your upper arm/shoulder bone.   You have been provided a shoulder sling to wear.  This will help keep the fracture in place.  Please wear this at all times until cleared by orthopedics.  You may take up to 1000mg  of tylenol  every 6 hours as needed for pain. Do not take more then 4g per day.   You may use up to 600mg  ibuprofen every 6 hours as needed for pain.  Do not exceed 2.4g of ibuprofen per day.  You may alternate these medications for complete coverage. See instructions below: Take 600mg  ibuprofen, then 3 hours later take 1000mg  tylenol , then 3 hours later 600mg  ibuprofen, then 3 hours later 1000mg  tylenol , so on and so forth for pain control.    You have been prescribed Oxycodone -this is a narcotic/controlled substance medication that has potential addicting qualities.  You may take 1 tablet every 4-6 hours as needed for severe pain not controlled with tylenol  and ibuprofen.  Do not drive or operate heavy machinery when taking this medicine as it can be sedating. Do not drink alcohol or take other sedating medications when taking this medicine for safety reasons.  Keep this out of reach of small children.     Call the orthopedics office (Dr. Celena) listed below to schedule a follow-up appointment within the next week  Your cut today was covered with skin glue to help it heal.  Please follow the instructions below:  Do not scratch, rub, or pick at the adhesive. Leave tissue adhesive in place. It will come off naturally after 7-10 days. Do not place tape over the adhesive. The adhesive could come off the wound when you pull the tape off. Do not take baths, swim, or use a hot tubs. You can shower after the first 24 hours. Cover the dressing with a watertight covering when you take a shower. Do not use any soaps, petroleum jelly products, or ointments on the wound. Certain ointments can weaken the  adhesive.   Return the ER for any pus draining from the wound, increased redness around the wound, any other new or concerning symptoms.   Return to the ER for any uncontrolled pain, numbness or tingling, discoloration, any other new or concerning symptoms.

## 2023-09-15 NOTE — ED Provider Notes (Addendum)
 [place] EMERGENCY DEPARTMENT AT [facility] Provider Note   CSN: [id number] Arrival date & time: 09/15/23  1654     Patient presents with: Cynthia Garrett   Cynthia Garrett is a 83 y.o. female presents with concern for a mechanical fall that occurred just prior to arrival.  States she was walking down her hallway when she tripped into the side table.  She hit the left eye on the table and then fell onto the right shoulder and right knee.  She denies any loss of consciousness.  Denies any headache, nausea or vomiting, or vision changes.  Reporting significant pain to the right shoulder.  Denies any paresthesias in the upper or lower extremities.  Was able to ambulate without difficulty after this accident.  Patient is not on any anticoagulation   HPI     Prior to Admission medications   Medication Sig Start Date End Date Taking? Authorizing Provider  oxyCODONE  (ROXICODONE ) 5 MG immediate release tablet Take 1 tablet (5 mg total) by mouth every 6 (six) hours as needed for severe pain (pain score 7-10) or breakthrough pain (Pain not controlled with Tylenol  and ibuprofen). 09/15/23  Yes Veta Palma, PA-C  calcium citrate-vitamin D  (CITRACAL+D) 315-200 MG-UNIT per tablet Take 2 tablets by mouth 2 (two) times daily.    [provider]  cholecalciferol (VITAMIN D3) 25 MCG (1000 UNIT) tablet Take 1,000 Units by mouth daily.    [provider]  cyanocobalamin (VITAMIN B12) 1000 MCG tablet Take 1,000 mcg by mouth daily.    [provider]  desvenlafaxine  (PRISTIQ ) 50 MG 24 hr tablet Take 1 tablet (50 mg total) by mouth daily. 09/03/23   Zollie Lowers, MD  hydrochlorothiazide  (MICROZIDE ) 12.5 MG capsule TAKE ONE CAPSULE DAILY AS NEEDED FOR SWELLING 09/03/23   Zollie Lowers, MD  mirtazapine  (REMERON ) 15 MG tablet 1/2 to 1 pill at bedtime For sleep 09/03/23   Zollie Lowers, MD  Multiple Vitamins-Minerals (MULTIVITAMIN WITH MINERALS) tablet Take 1 tablet by mouth  daily.    [provider]  pravastatin  (PRAVACHOL ) 40 MG tablet Take 1 tablet (40 mg total) by mouth daily. 09/03/23   Zollie Lowers, MD  SYNTHROID  75 MCG tablet TAKE 1 TABLET (75 MCG TOTAL) BY MOUTH DAILY BEFORE BREAKFAST. NAME BRAND ONLY 09/03/23   Zollie Lowers, MD  tretinoin (RETIN-A) 0.025 % cream Apply topically at bedtime as needed. 04/14/21   [provider]  trimethoprim  (TRIMPEX ) 100 MG tablet Take 100 mg by mouth daily. 05/24/19   [provider]    Allergies: Octacosanol, Tetanus toxoids, Tizanidine , and Sertraline     Review of Systems  Musculoskeletal:        Right shoulder pain    Updated Vital Signs BP (!) 145/89 (BP Location: Left Arm)   Pulse 73   Temp 98.4 F (36.9 C) (Oral)   Resp 16   Ht 5' 2 (1.575 m)   Wt 60.8 kg   SpO2 99%   BMI 24.51 kg/m   Physical Exam Vitals and nursing note reviewed.  Constitutional:      Appearance: Normal appearance.  HENT:     Head: Atraumatic.      Comments: Edema underneath the left eye Eyes:     Extraocular Movements: Extraocular movements intact.     Pupils: Pupils are equal, round, and reactive to light.  Cardiovascular:     Rate and Rhythm: Normal rate and regular rhythm.     Comments: Radial pulse 2+ bilaterally Pulmonary:     Effort:  Pulmonary effort is normal.  Musculoskeletal:     Comments: General No obvious deformity. No erythema, edema, contusions, open wounds   Palpation Tender to palpation over the right mid humerus.  Nontender to the humeral head or distal humerus. Non-tender of the left humerus  Non-tender to palpation of the clavicles, radius and ulna, carpal bones, 1st-5th metacarpals and phalanges bilaterally Non tender over the femur, patella, tibia or fibula bilaterally  Non-tender over the cervical, thoracic, or lumbar spinous processes. Non-tender to palpation of the paraspinal region of the back or chest wall diffusely  No tenderness of the pelvis  diffusely  ROM Patient unable to perform range of motion of the right shoulder due to pain.  Full range of motion of the left shoulder. Full elbow, wrist, knee flexion and extension bilaterally Intact plantarflexion and dorsiflexion, hip flexion bilaterally  Sensation: Sensation intact throughout the bilateral upper and lower extremity  Strength: 5/5 strength with hand squeeze bilaterally 5/5 strength with resisted knee flexion and extension and ankle plantarflexion and dorsiflexion bilaterally    Skin:    Comments: 1cm well approximated laceration under the left eye. No active bleeding. No foreign body  Neurological:     General: No focal deficit present.     Mental Status: She is alert.     Comments: Intact sensation of the 1st through 5th digits of the bilateral upper extremities  Psychiatric:        Mood and Affect: Mood normal.        Behavior: Behavior normal.     (all labs ordered are listed, but only abnormal results are displayed) Labs Reviewed - No data to display  EKG: None  Radiology: DG Shoulder Right Result Date: 09/15/2023 CLINICAL DATA:  Filling guardian, shoulder injury, limited range of motion EXAM: RIGHT SHOULDER - 2+ VIEW; RIGHT HUMERUS - 2+ VIEW COMPARISON:  None Available. FINDINGS: Comminuted minimally displaced fracture of the anatomic neck of the right humeral head. Evaluation for intra-articular extension is limited by degenerative change. IMPRESSION: Comminuted minimally displaced fracture of the anatomic neck of the right humeral head. Evaluation for intra-articular extension is limited by degenerative change. Electronically Signed   By: Norman Gatlin M.D.   On: 09/15/2023 18:05   DG Humerus Right Result Date: 09/15/2023 CLINICAL DATA:  Filling guardian, shoulder injury, limited range of motion EXAM: RIGHT SHOULDER - 2+ VIEW; RIGHT HUMERUS - 2+ VIEW COMPARISON:  None Available. FINDINGS: Comminuted minimally displaced fracture of the anatomic neck  of the right humeral head. Evaluation for intra-articular extension is limited by degenerative change. IMPRESSION: Comminuted minimally displaced fracture of the anatomic neck of the right humeral head. Evaluation for intra-articular extension is limited by degenerative change. Electronically Signed   By: Norman Gatlin M.D.   On: 09/15/2023 18:05   CT Head Wo Contrast Result Date: 09/15/2023 CLINICAL DATA:  [place] in the garden, abrasion under left eye, head trauma EXAM: CT HEAD WITHOUT CONTRAST TECHNIQUE: Contiguous axial images were obtained from the base of the skull through the vertex without intravenous contrast. RADIATION DOSE REDUCTION: This exam was performed according to the departmental dose-optimization program which includes automated exposure control, adjustment of the mA and/or kV according to patient size and/or use of iterative reconstruction technique. COMPARISON:  None Available. FINDINGS: Brain: No intracranial hemorrhage, mass effect, or evidence of acute infarct. No hydrocephalus. No extra-axial fluid collection. Age-commensurate cerebral atrophy and chronic small vessel ischemic disease. Vascular: No hyperdense vessel. Intracranial arterial calcification. Skull: No fracture or focal lesion. Sinuses/Orbits:  No acute finding. Other: Left infraorbital soft tissue contusion IMPRESSION: 1. No acute intracranial abnormality. 2. Left infraorbital soft tissue contusion. No fracture. Electronically Signed   By: Norman Gatlin M.D.   On: 09/15/2023 17:59     .Laceration Repair  Date/Time: 09/15/2023 7:28 PM  Performed by: Veta Palma, PA-C Authorized by: Veta Palma, PA-C   Consent:    Consent obtained:  Verbal   Consent given by:  Patient   Risks, benefits, and alternatives were discussed: yes     Risks discussed:  Infection, pain and poor cosmetic result   Alternatives discussed:  No treatment Universal protocol:    Patient identity confirmed:  Verbally with  patient Anesthesia:    Anesthesia method:  None Laceration details:    Location:  Face   Length (cm):  1 Pre-procedure details:    Preparation:  Patient was prepped and draped in usual sterile fashion and imaging obtained to evaluate for foreign bodies Exploration:    Imaging outcome: foreign body not noted     Wound exploration: wound explored through full range of motion and entire depth of wound visualized     Wound extent: no foreign body   Treatment:    Area cleansed with:  Chlorhexidine    Debridement:  None   Undermining:  None Skin repair:    Repair method:  Tissue adhesive (Dermabond) Repair type:    Repair type:  Simple Post-procedure details:    Dressing:  Open (no dressing)   Procedure completion:  Tolerated well, no immediate complications    Medications Ordered in the ED  oxyCODONE -acetaminophen  (PERCOCET/ROXICET) 5-325 MG per tablet 1 tablet (1 tablet Oral Given 09/15/23 1724)                                    Medical Decision Making Amount and/or Complexity of Data Reviewed Radiology: ordered.  Risk Prescription drug management.     Differential diagnosis includes but is not limited to fracture, dislocation, tendon injury, nerve injury, vascular injury, intracranial hemorrhage, skull fracture  ED Course:  Upon initial evaluation, patient is well-appearing, no acute distress.  Stable vitals aside from mildly elevated blood pressure 159/88.  Good story for mechanical fall, no concern for other etiology at this time.  Reporting pain to her right shoulder.  No obvious deformity on exam, but is tender to the mid humerus.  Decreased range of motion of the right shoulder due to pain.  She remains neurovascular intact in the bilateral upper extremities.  She did hit underneath the left eye, and there is a small 1 cm laceration present.  No active bleeding.  No foreign debris noted.  Mild amount of edema underneath the left eye.  EOM intact and pupils equal round  reactive.  No cervical, thoracic, or lumbar spinal tenderness palpation.  No tenderness diffusely of the bilateral lower extremities.  Able to ambulate without difficulty.  Imaging Studies ordered: I ordered imaging studies including x-ray right shoulder, x-ray right humerus, CT head I independently visualized the imaging with scope of interpretation limited to determining acute life threatening conditions related to emergency care. Imaging showed  X-ray right shoulder comminuted minimally displaced fracture of the anatomical neck of the right humeral head.  Evaluation for intra-articular extension is limited by degenerative change CT head without acute abnormality.  Left infraorbital soft tissue contusion.  No fracture I agree with the radiologist interpretation   Medications Given: Percocet  Upon re-evaluation, patient remains well-appearing.  Discussed that she has a right humeral head fracture.  No intracranial abnormalities on CT scan.  Reports pain well-controlled with the Percocet given.  Patient was placed into shoulder sling.  Remains neurovascularly intact in the right upper extremity.  Was going to send oxycodone  to 24-hour pharmacy for her to pick up tonight, but patient would like it sent to her normal pharmacy to pick up in the morning.  States she will take her home tramadol  if she has any breakthrough pain.  The small laceration to her face was repaired with Dermabond.  Patient tolerates well.  Stable and appropriate for discharge home    Impression: Closed right humeral neck fracture  Disposition:  The patient was discharged home with instructions to take Tylenol  and ibuprofen as needed for pain.  Oxycodone  as needed for breakthrough pain.  Follow-up with Dr. Celena with orthopedics within the next week for further management.  Wear shoulder sling provided at all times. Return precautions given.    Record Review: External records from outside source obtained and reviewed  including PDMP where she was prescribed tramadol  by outside provider in June 2024     This chart was dictated using voice recognition software, Nurse, children's. Despite the best efforts of this provider to proofread and correct errors, errors may still occur which can change documentation meaning.       Final diagnoses:  Humeral head fracture, right, closed, initial encounter    ED Discharge Orders          Ordered    oxyCODONE  (ROXICODONE ) 5 MG immediate release tablet  Every 6 hours PRN       Note to Pharmacy: For right humeral head fracture   09/15/23 1840               Veta Palma, PA-C 09/15/23 1929    Veta Palma, PA-C 09/15/23 1930    Simon Lavonia SAILOR, MD 09/15/23 2045

## 2023-09-15 NOTE — ED Notes (Signed)
Patient transported to X-ray & CT °

## 2023-09-15 NOTE — ED Triage Notes (Signed)
 Pt POV reporting mechanical fall in hallway at home, hit head on on end table and fell on R side, reporting R shoulder pain, unable to move arm. Abrasion noted to L cheek. No LOC, no thinners.

## 2023-09-19 DIAGNOSIS — S42201A Unspecified fracture of upper end of right humerus, initial encounter for closed fracture: Secondary | ICD-10-CM | POA: Diagnosis not present

## 2023-09-24 DIAGNOSIS — S42211D Unspecified displaced fracture of surgical neck of right humerus, subsequent encounter for fracture with routine healing: Secondary | ICD-10-CM | POA: Diagnosis not present

## 2023-09-27 ENCOUNTER — Ambulatory Visit: Admitting: Physical Medicine and Rehabilitation

## 2023-09-28 DIAGNOSIS — S42211D Unspecified displaced fracture of surgical neck of right humerus, subsequent encounter for fracture with routine healing: Secondary | ICD-10-CM | POA: Diagnosis not present

## 2023-10-01 DIAGNOSIS — S42211D Unspecified displaced fracture of surgical neck of right humerus, subsequent encounter for fracture with routine healing: Secondary | ICD-10-CM | POA: Diagnosis not present

## 2023-10-03 DIAGNOSIS — S42201D Unspecified fracture of upper end of right humerus, subsequent encounter for fracture with routine healing: Secondary | ICD-10-CM | POA: Diagnosis not present

## 2023-10-03 DIAGNOSIS — S42211D Unspecified displaced fracture of surgical neck of right humerus, subsequent encounter for fracture with routine healing: Secondary | ICD-10-CM | POA: Diagnosis not present

## 2023-10-08 ENCOUNTER — Telehealth: Payer: Self-pay | Admitting: Orthopaedic Surgery

## 2023-10-08 DIAGNOSIS — S42211D Unspecified displaced fracture of surgical neck of right humerus, subsequent encounter for fracture with routine healing: Secondary | ICD-10-CM | POA: Diagnosis not present

## 2023-10-08 NOTE — Telephone Encounter (Signed)
 Pt called and was seen at [facility] for right shoulder and right knee. Pt was to have surgery with Dr Celena Beers but out of  network with her insurance and was told to call our office. Spoke to triage and advised to send message to see when pt can be seen in out office for eval of shoulder. Please call pt about this message at [phone]. Pt states if we call to please leave detailed message if she is unable to answer. Pt phone number is [phone]

## 2023-10-08 NOTE — Telephone Encounter (Signed)
 I can see her at my next available appt if she wants to wait.

## 2023-10-08 NOTE — Telephone Encounter (Signed)
 Do you want to see/ work her in ? Your next available appt is 10/16/2023.

## 2023-10-10 DIAGNOSIS — S42211D Unspecified displaced fracture of surgical neck of right humerus, subsequent encounter for fracture with routine healing: Secondary | ICD-10-CM | POA: Diagnosis not present

## 2023-10-16 ENCOUNTER — Encounter: Admitting: Orthopaedic Surgery

## 2023-10-16 DIAGNOSIS — S42211D Unspecified displaced fracture of surgical neck of right humerus, subsequent encounter for fracture with routine healing: Secondary | ICD-10-CM | POA: Diagnosis not present

## 2023-10-17 DIAGNOSIS — S42211D Unspecified displaced fracture of surgical neck of right humerus, subsequent encounter for fracture with routine healing: Secondary | ICD-10-CM | POA: Diagnosis not present

## 2023-10-22 ENCOUNTER — Telehealth: Payer: Self-pay | Admitting: Physical Medicine and Rehabilitation

## 2023-10-22 DIAGNOSIS — S42211D Unspecified displaced fracture of surgical neck of right humerus, subsequent encounter for fracture with routine healing: Secondary | ICD-10-CM | POA: Diagnosis not present

## 2023-10-24 DIAGNOSIS — S42201D Unspecified fracture of upper end of right humerus, subsequent encounter for fracture with routine healing: Secondary | ICD-10-CM | POA: Diagnosis not present

## 2023-10-25 DIAGNOSIS — S42211D Unspecified displaced fracture of surgical neck of right humerus, subsequent encounter for fracture with routine healing: Secondary | ICD-10-CM | POA: Diagnosis not present

## 2023-10-29 ENCOUNTER — Encounter: Payer: Self-pay | Admitting: Physical Medicine and Rehabilitation

## 2023-10-29 ENCOUNTER — Ambulatory Visit: Admitting: Physical Medicine and Rehabilitation

## 2023-10-29 DIAGNOSIS — M47817 Spondylosis without myelopathy or radiculopathy, lumbosacral region: Secondary | ICD-10-CM | POA: Diagnosis not present

## 2023-10-29 DIAGNOSIS — G8929 Other chronic pain: Secondary | ICD-10-CM

## 2023-10-29 DIAGNOSIS — M47816 Spondylosis without myelopathy or radiculopathy, lumbar region: Secondary | ICD-10-CM

## 2023-10-29 DIAGNOSIS — S42211D Unspecified displaced fracture of surgical neck of right humerus, subsequent encounter for fracture with routine healing: Secondary | ICD-10-CM | POA: Diagnosis not present

## 2023-10-29 DIAGNOSIS — M545 Low back pain, unspecified: Secondary | ICD-10-CM | POA: Diagnosis not present

## 2023-10-29 NOTE — Progress Notes (Signed)
 Pain Scale   Average Pain 8 Patient advising she has lower back pain that is constant and she didn't get any relief after injection in June.        +Driver, -BT, -Dye Allergies.

## 2023-10-29 NOTE — Progress Notes (Signed)
 TATIONA STECH - 83 y.o. female MRN [id number]  Date of birth: Jul 04, 1940  Office Visit Note: Visit Date: 10/29/2023 PCP: Zollie Lowers, MD Referred by: Zollie Lowers, MD  Subjective: Chief Complaint  Patient presents with   Lower Back - Pain   HPI: Cynthia Garrett is a 83 y.o. female who comes in today for evaluation of chronic, worsening and severe bilateral lower back pain radiating to bilateral hips. Pain ongoing for several years. Her pain worsens with bending, standing and walking. Reports severe pain with household activities such as washing dishes, cooking and laundry. She describes pain as dull and aching sensation, currently rates as 5 out of 10. Recently completed course of formal physical therapy with short term relief of pain. Lumbar MRI imaging from May shows moderate multifactorial spinal and bilateral lateral recess stenosis at L4-L5. There is 7 mm degenerative anterolisthesis at L4. She underwent left L4-L5 interlaminar epidural steroid injection in our office on 08/27/2023, no relief of pain with this procedure. She sustained mechanical fall at home on 09/15/2023, sustained right humeral head fracture, currently being managed by Dr. Ozell Bruch. Currently attending PT for her right arm. She is using cane to assist with ambulation.      Review of Systems  Musculoskeletal:  Positive for back pain.  Neurological:  Negative for tingling, sensory change, focal weakness and weakness.  All other systems reviewed and are negative.  Otherwise per HPI.  Assessment & Plan: Visit Diagnoses:    ICD-10-CM   1. Chronic bilateral low back pain without sciatica  M54.50 Ambulatory referral to Physical Medicine Rehab   G89.29     2. Spondylosis without myelopathy or radiculopathy, lumbosacral region  M47.817 Ambulatory referral to Physical Medicine Rehab    3. Facet arthropathy, lumbar  M47.816 Ambulatory referral to Physical Medicine Rehab       Plan: Findings:  Chronic,  worsening and severe bilateral lower back pain radiating to bilateral hips. Patient continues to have severe pain despite good conservative therapies such as home exercise regimen, rest and use of medications. Patients clinical presentation and exam are consistent with facet mediated pain. There is facet arthropathy at the level of L4-L5. We discussed treatment plan in detail today. Next step is to perform diagnostic and hopefully therapeutic bilateral L4-L5 facet joint injection under fluoroscopic guidance. If good relief of pain with facet joint injections we would consider longer sustained pain relief with radiofrequency ablation. Patient has no questions at this time. She is scheduled to be cleared by Dr. Bruch in the upcoming weeks, should be able to lay on table for injection. No red flag symptoms noted upon exam today.     Meds & Orders: No orders of the defined types were placed in this encounter.   Orders Placed This Encounter  Procedures   Ambulatory referral to Physical Medicine Rehab    Follow-up: Return for Bilateral L4-L5 facet joint injection.   Procedures: No procedures performed      Clinical History: CLINICAL DATA:  Chronic low back pain worsening over the past 6 months.   EXAM: MRI LUMBAR SPINE WITHOUT CONTRAST   TECHNIQUE: Multiplanar, multisequence MR imaging of the lumbar spine was performed. No intravenous contrast was administered.   COMPARISON:  Radiographs 09/07/2021   FINDINGS: Segmentation: There are five lumbar type vertebral bodies. The last full intervertebral disc space is labeled L5-S1. This correlates with the lumbar radiographs.   Alignment:  Degenerative anterolisthesis of L4 estimated at 7 mm.   Vertebrae:  No fracture, evidence of discitis, or bone lesion.   Conus medullaris and cauda equina: Conus extends to the T12-L1 level. Conus and cauda equina appear normal.   Paraspinal and other soft tissues: No significant paraspinal  or retroperitoneal findings. No worrisome renal lesions, aortic aneurysm or retroperitoneal adenopathy.   Disc levels:   T12-L1: No significant findings.   L1-2: Mild bulging annulus, osteophytic ridging and mild to moderate facet disease contributing to mild bilateral lateral recess stenosis but no significant spinal or foraminal stenosis.   L2-3: Advanced degenerative disc disease with marked disc space narrowing. Broad-based bulging desiccated disc and osteophytic ridging in combination with moderate facet disease contributes to mild/moderate spinal and bilateral lateral recess stenosis. No foraminal stenosis.   L3-4: Bulging degenerated annulus, osteophytic ridging and facet disease contributing to mild spinal and bilateral lateral recess stenosis. No significant foraminal stenosis.   L4-5: Bulging and uncovered disc along with osteophytic ridging and severe facet disease contributing to moderate spinal and bilateral lateral recess stenosis. No significant foraminal stenosis.   L5-S1: Advanced left-sided facet disease and bulging uncovered disc with small central disc protrusion. No significant neural compression, spinal or foraminal stenosis.   IMPRESSION: 1. Degenerative anterolisthesis of L4 estimated at 7 mm. 2. Mild bilateral lateral recess stenosis at L1-2. 3. Mild/moderate multifactorial spinal and bilateral lateral recess stenosis at L2-3. 4. Mild multifactorial spinal and bilateral lateral recess stenosis at L3-4. 5. Moderate multifactorial spinal and bilateral lateral recess stenosis at L4-5. 6. Advanced left-sided facet disease and bulging uncovered disc with small central disc protrusion at L5-S1. No significant neural compression, spinal or foraminal stenosis.     Electronically Signed   By: MYRTIS Stammer M.D.   On: 07/24/2023 17:42   She reports that she quit smoking about 50 years ago. Her smoking use included cigarettes. She started smoking about 60  years ago. She has a 2.5 pack-year smoking history. She has never used smokeless tobacco. No results for input(s): HGBA1C, LABURIC in the last 8760 hours.  Objective:  VS:  HT:    WT:   BMI:     BP:   HR: bpm  TEMP: ( )  RESP:  Physical Exam Vitals and nursing note reviewed.  HENT:     Head: Normocephalic and atraumatic.     Right Ear: External ear normal.     Left Ear: External ear normal.     Nose: Nose normal.     Mouth/Throat:     Mouth: Mucous membranes are moist.  Eyes:     Extraocular Movements: Extraocular movements intact.  Cardiovascular:     Rate and Rhythm: Normal rate.     Pulses: Normal pulses.  Pulmonary:     Effort: Pulmonary effort is normal.  Abdominal:     General: Abdomen is flat. There is no distension.  Musculoskeletal:        General: Tenderness present.     Cervical back: Normal range of motion.     Comments: Patient is slow to rise from seated position to standing. Pain noted with facet loading and lumbar extension. 5/5 strength noted with bilateral hip flexion, knee flexion/extension, ankle dorsiflexion/plantarflexion and EHL. No clonus noted bilaterally. No pain upon palpation of greater trochanters. No pain with internal/external rotation of bilateral hips. Sensation intact bilaterally. Negative slump test bilaterally. Ambulates with cane, gait slow and unsteady.   Skin:    General: Skin is warm and dry.     Capillary Refill: Capillary refill takes less than 2 seconds.  Neurological:     General: No focal deficit present.     Mental Status: She is alert and oriented to person, place, and time.     Gait: Gait abnormal.  Psychiatric:        Mood and Affect: Mood normal.        Behavior: Behavior normal.     Ortho Exam  Imaging: No results found.  Past Medical/Family/Surgical/Social History: Medications & Allergies reviewed per EMR, new medications updated. 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 12/06/2016   Chronic seasonal allergic rhinitis 11/17/2015   Anxiety state 11/17/2015   Hypothyroidism 11/17/2015   Hyperlipidemia 11/17/2015   Osteopenia 11/17/2015   Tendonitis 11/17/2015   Multiple lung nodules on CT 10/25/2015   Irritable bowel syndrome with both constipation and diarrhea 09/17/2015   Past Medical History:  Diagnosis Date   Allergic rhinitis, seasonal    Allergy    Anxiety    Arthritis    Cataract    extraction   Headache(784.0)    Hyperlipidemia    Hypothyroidism    Left breast mass    Lung nodules    Osteopenia    PONV (postoperative nausea and vomiting)    Thyroid  disease    Family History  Problem Relation Age of Onset   Heart disease Mother    Hypertension Mother    Diabetes Sister    Hypertension Sister    COPD Brother    Anesthesia problems Neg Hx    Hypotension Neg Hx    Malignant hyperthermia Neg Hx    Pseudochol deficiency Neg Hx    Colon cancer Neg Hx    Cancer Neg Hx    Breast cancer Neg Hx    Past Surgical History:  Procedure Laterality Date   BREAST EXCISIONAL BIOPSY Left 2018   BREAST LUMPECTOMY WITH RADIOACTIVE SEED LOCALIZATION Left 12/06/2016   Procedure: LEFT BREAST LUMPECTOMY WITH RADIOACTIVE SEED LOCALIZATION;  Surgeon: Gail Favorite, MD;  Location: [facility];  Service: General;  Laterality: Left;   BREAST SURGERY     BUNIONECTOMY Right 04/04/2021   5th toe   CATARACT EXTRACTION W/PHACO  06/26/2011   Procedure: CATARACT EXTRACTION PHACO AND INTRAOCULAR LENS PLACEMENT (IOC);  Surgeon: Cherene Mania, MD;  Location: [facility];  Service: Ophthalmology;  Laterality: Right;  CDE 10.34   CATARACT EXTRACTION W/PHACO  07/06/2011   Procedure: CATARACT EXTRACTION PHACO AND INTRAOCULAR LENS PLACEMENT (IOC);  Surgeon: Cherene Mania, MD;  Location: [facility];  Service: Ophthalmology;  Laterality: Left;  CDE:9.92   COLONOSCOPY      EYE SURGERY     Hysterotomy     fibroid tumor   Social History   Occupational History    Employer: RETIRED  Tobacco Use   Smoking status: Former    Current packs/day: 0.00    Average packs/day: 0.3 packs/day for 10.0 years (2.5 ttl pk-yrs)    Types: Cigarettes    Start date: 06/08/1963    Quit date: 06/07/1973    Years since quitting: 50.4   Smokeless tobacco: Never   Tobacco comments:    Significant second-hand exposure.  Vaping Use   Vaping status: Never Used  Substance and Sexual Activity   Alcohol use: Never    Comment: social   Drug use: No   Sexual activity: Yes    Birth control/protection: Post-menopausal

## 2023-11-01 ENCOUNTER — Telehealth: Payer: Self-pay | Admitting: Family Medicine

## 2023-11-01 DIAGNOSIS — S42211D Unspecified displaced fracture of surgical neck of right humerus, subsequent encounter for fracture with routine healing: Secondary | ICD-10-CM | POA: Diagnosis not present

## 2023-11-01 DIAGNOSIS — Z0279 Encounter for issue of other medical certificate: Secondary | ICD-10-CM

## 2023-11-01 NOTE — Telephone Encounter (Signed)
 pt dropped off handicap forms to be completed and signed.  Form Fee Paid? (Y/N)       yes     If NO, form is placed on front office manager desk to hold until payment received. If YES, then form will be placed in the RX/HH Nurse Coordinators box for completion.  Form will not be processed until payment is received

## 2023-11-08 ENCOUNTER — Ambulatory Visit: Payer: Self-pay

## 2023-11-08 NOTE — Telephone Encounter (Signed)
 Scheduled appt for Monday first available

## 2023-11-08 NOTE — Telephone Encounter (Signed)
 FYI Only or Action Required?: Action required by provider: request for appointment.  Patient was last seen in primary care on 09/03/2023 by Zollie Lowers, MD.  Called Nurse Triage reporting Leg Swelling.  Symptoms began about a month ago.  Interventions attempted: Nothing.  Symptoms are: gradually worsening.  Triage Disposition: See Physician Within 24 Hours  Patient/caregiver understands and will follow disposition?: No, wishes to speak with PCP        Copied from CRM #[id number]. Topic: Clinical - Red Word Triage >> Nov 08, 2023  2:20 PM Turkey B wrote: [facility] that prompted transfer to Nurse Triage: Patient states had a fall in July  and broke shoulder,now she is saying she can;t walk well Reason for Disposition  [1] MODERATE leg swelling (e.g., swelling extends up to knees) AND [2] new-onset or getting worse  Answer Assessment - Initial Assessment Questions Pt states symptoms started with her feet feeling like they weigh 50 pounds and states it is hard to move her legs. Pt states she is taking a prescription med for swelling in fingers but is unsure of the name. Patient states her R calf is bigger than her L calf/ This RN offered to schedule patient with another provider but patient declined. Pt states she only wants to see her provider for symptoms and is requesting an earlier appointment with him. Will route to office for follow up.    1. ONSET: When did the swelling start? (e.g., minutes, hours, days)     Months  2. LOCATION: What part of the leg is swollen?  Are both legs swollen or just one leg?     R leg  3. SEVERITY: How bad is the swelling? (e.g., localized; mild, moderate, severe)     Moderate  4. REDNESS: Is there redness or signs of infection?     Denies  5. PAIN: Is the swelling painful to touch? If Yes, ask: How painful is it?   (Scale 1-10; mild, moderate or severe)     Denies  6. FEVER: Do you have a fever? If Yes, ask: What is it, how was  it measured, and when did it start?      Denies  8. MEDICAL HISTORY: Do you have a history of blood clots (e.g., DVT), cancer, heart failure, kidney disease, or liver failure?     Denies  9. RECURRENT SYMPTOM: Have you had leg swelling before? If Yes, ask: When was the last time? What happened that time?     Not at this level, mild amount before.  10. OTHER SYMPTOMS: Do you have any other symptoms? (e.g., chest pain, difficulty breathing)       Lack of energy  Protocols used: Leg Swelling and Edema-A-AH

## 2023-11-09 NOTE — Telephone Encounter (Signed)
LMOVM handicap form ready

## 2023-11-12 ENCOUNTER — Ambulatory Visit (INDEPENDENT_AMBULATORY_CARE_PROVIDER_SITE_OTHER): Admitting: Family Medicine

## 2023-11-12 ENCOUNTER — Ambulatory Visit (INDEPENDENT_AMBULATORY_CARE_PROVIDER_SITE_OTHER)

## 2023-11-12 ENCOUNTER — Encounter: Payer: Self-pay | Admitting: Family Medicine

## 2023-11-12 VITALS — BP 134/84 | HR 82 | Temp 97.8°F | Ht 62.0 in | Wt 130.0 lb

## 2023-11-12 DIAGNOSIS — E039 Hypothyroidism, unspecified: Secondary | ICD-10-CM

## 2023-11-12 DIAGNOSIS — R5383 Other fatigue: Secondary | ICD-10-CM | POA: Diagnosis not present

## 2023-11-12 DIAGNOSIS — S42211D Unspecified displaced fracture of surgical neck of right humerus, subsequent encounter for fracture with routine healing: Secondary | ICD-10-CM | POA: Diagnosis not present

## 2023-11-12 DIAGNOSIS — J449 Chronic obstructive pulmonary disease, unspecified: Secondary | ICD-10-CM | POA: Diagnosis not present

## 2023-11-12 DIAGNOSIS — R251 Tremor, unspecified: Secondary | ICD-10-CM

## 2023-11-12 DIAGNOSIS — R601 Generalized edema: Secondary | ICD-10-CM

## 2023-11-12 DIAGNOSIS — R059 Cough, unspecified: Secondary | ICD-10-CM | POA: Diagnosis not present

## 2023-11-12 DIAGNOSIS — R0602 Shortness of breath: Secondary | ICD-10-CM | POA: Diagnosis not present

## 2023-11-12 NOTE — Progress Notes (Signed)
 Subjective:  Patient ID: Cynthia Garrett, female    DOB: 1940/12/09  Age: 83 y.o. MRN: [id number]  CC: Leg Swelling   HPI  Discussed the use of AI scribe software for clinical note transcription with the patient, who gave verbal consent to proceed.  History of Present Illness Cynthia Garrett is an 83 year old female who presents with bilateral leg swelling and fatigue.  She has been experiencing bilateral leg swelling for several weeks, possibly up to two months, initially starting in the right leg. The swelling is more pronounced in the afternoons and evenings, with noticeable puffiness in the feet and ankles. She has difficulty walking and balancing, and reports that the swelling tends to decrease overnight.  She experiences significant fatigue and lack of energy, which has been a concern for months. She questions whether her medications might be contributing to her fatigue. She is currently taking mirtazapine  for sleep, which she increased to a full pill after breaking her shoulder, and hydrochlorothiazide  as a fluid pill, which she takes infrequently. She also takes vitamin B12 and has been trying to drink more water to address potential dehydration. Her energy levels were low even before the shoulder injury.  She is concerned about a tremor in her fingers and questions if it could be related to Parkinson's disease. She denies taking oxytocin, which was prescribed after her shoulder injury, and is concerned about the potential side effects of desvenlafaxine  (Pristiq ) on her energy levels.   Thyroid   med being taken as directed. Energy poor. No constipation or weight change. No excessive cold or hot flashes       09/03/2023   10:31 AM 07/10/2023   10:37 AM 05/31/2023   11:39 AM  Depression screen PHQ 2/9  Decreased Interest 1 2   Down, Depressed, Hopeless 1 1   PHQ - 2 Score 2 3   Altered sleeping 1 2   Tired, decreased energy 1 3   Change in appetite 1 2   Feeling bad or failure  about yourself  0 2   Trouble concentrating 0 0   Moving slowly or fidgety/restless 1 3   Suicidal thoughts 0 0   PHQ-9 Score 6 15   Difficult doing work/chores  Somewhat difficult Not difficult at all    History [name] has a past medical history of Allergic rhinitis, seasonal, Allergy, Anxiety, Arthritis, Cataract, Headache(784.0), Hyperlipidemia, Hypothyroidism, Left breast mass, Lung nodules, Osteopenia, PONV (postoperative nausea and vomiting), and Thyroid  disease.   She has a past surgical history that includes Hysterotomy; Colonoscopy; Cataract extraction w/PHACO (06/26/2011); Cataract extraction w/PHACO (07/06/2011); Breast lumpectomy with radioactive seed localization (Left, 12/06/2016); Breast excisional biopsy (Left, 2018); Bunionectomy (Right, 04/04/2021); Eye surgery; and Breast surgery.   Her family history includes COPD in her brother; Diabetes in her sister; Heart disease in her mother; Hypertension in her mother and sister.She reports that she quit smoking about 50 years ago. Her smoking use included cigarettes. She started smoking about 60 years ago. She has a 2.5 pack-year smoking history. She has never used smokeless tobacco. She reports that she does not drink alcohol and does not use drugs.    ROS Review of Systems  Constitutional: Negative.   HENT:  Negative for congestion.   Eyes:  Negative for visual disturbance.  Respiratory:  Negative for shortness of breath.   Cardiovascular:  Positive for leg swelling. Negative for chest pain.  Gastrointestinal:  Negative for abdominal pain, constipation, diarrhea, nausea and vomiting.  Genitourinary:  Negative  for difficulty urinating.  Musculoskeletal:  Negative for arthralgias and myalgias.  Neurological:  Positive for tremors. Negative for headaches.  Psychiatric/Behavioral:  Negative for sleep disturbance.     Objective:  BP 134/84   Pulse 82   Temp 97.8 F (36.6 C)   Ht 5' 2 (1.575 m)   Wt 130 lb (59 kg)   SpO2  98%   BMI 23.78 kg/m   BP Readings from Last 3 Encounters:  11/12/23 134/84  09/15/23 (!) 145/89  09/03/23 125/80    Wt Readings from Last 3 Encounters:  11/12/23 130 lb (59 kg)  09/15/23 134 lb (60.8 kg)  09/03/23 132 lb (59.9 kg)     Physical Exam Constitutional:      General: She is not in acute distress.    Appearance: She is well-developed.  Cardiovascular:     Rate and Rhythm: Normal rate and regular rhythm.  Pulmonary:     Breath sounds: Normal breath sounds.  Musculoskeletal:        General: Normal range of motion.  Skin:    General: Skin is warm and dry.  Neurological:     Mental Status: She is alert and oriented to person, place, and time.     Comments: Fine tremor for outstretched hands       Assessment & Plan:  Generalized edema -     CBC with Differential/Platelet -     CMP14+EGFR -     TSH + free T4 -     Brain natriuretic peptide -     DG Chest 2 View; Future  Tremor  Fatigue, unspecified type -     CBC with Differential/Platelet -     CMP14+EGFR -     TSH + free T4 -     Brain natriuretic peptide -     DG Chest 2 View; Future  Hypothyroidism, unspecified type -     TSH + free T4  Work up of tremor deferred until follow up.   Assessment and Plan Assessment & Plan Bilateral lower extremity edema and fatigue   Edema has been present for several weeks, initially more pronounced on the right but now affecting both legs. It decreases overnight and worsens during the day. Fatigue is also reported. Differential diagnosis includes cardiac and renal causes. Recent blood work indicated possible dehydration. Thyroid  function was normal in February and July. Non-compliance with diuretic may contribute to edema. Order repeat thyroid  function tests, kidney function tests, and blood counts. Instruct to take hydrochlorothiazide  daily for edema. Order chest x-ray to evaluate heart size and function.  Insomnia   Insomnia is managed with mirtazapine ,  increased to a full pill after a shoulder fracture. The increase may contribute to fatigue. Reports improved sleep but advised to balance sleep quality with potential side effects of increased fatigue. Reduce mirtazapine  to half a pill daily.  Depression and anxiety   Depression and anxiety are managed with desvenlafaxine  (Pristiq ). Reports persistent fatigue and questions if medication contributes. Advised against immediate changes to avoid exacerbating anxiety or depression. Monitor response to changes in other medications before considering adjustments to desvenlafaxine .  Recent right shoulder fracture, healing   The right shoulder fracture occurred on September 15, 2023. She is undergoing physical therapy with reported improvement in sleep as she can now get into bed. Recovery is ongoing, and energy levels may be affected by the healing process. Continue physical therapy for shoulder rehabilitation.        Follow-up: Return in about 2 weeks (  around 11/26/2023).  Butler Der, M.D.

## 2023-11-13 ENCOUNTER — Ambulatory Visit: Payer: Self-pay | Admitting: Family Medicine

## 2023-11-13 LAB — CBC WITH DIFFERENTIAL/PLATELET
Basophils Absolute: 0 x10E3/uL (ref 0.0–0.2)
Basos: 0 %
EOS (ABSOLUTE): 0.1 x10E3/uL (ref 0.0–0.4)
Eos: 1 %
Hematocrit: 40.3 % (ref 34.0–46.6)
Hemoglobin: 13 g/dL (ref 11.1–15.9)
Immature Grans (Abs): 0 x10E3/uL (ref 0.0–0.1)
Immature Granulocytes: 0 %
Lymphocytes Absolute: 1.4 x10E3/uL (ref 0.7–3.1)
Lymphs: 18 %
MCH: 30.8 pg (ref 26.6–33.0)
MCHC: 32.3 g/dL (ref 31.5–35.7)
MCV: 96 fL (ref 79–97)
Monocytes Absolute: 0.5 x10E3/uL (ref 0.1–0.9)
Monocytes: 6 %
Neutrophils Absolute: 5.7 x10E3/uL (ref 1.4–7.0)
Neutrophils: 75 %
Platelets: 266 x10E3/uL (ref 150–450)
RBC: 4.22 x10E6/uL (ref 3.77–5.28)
RDW: 11.9 % (ref 11.7–15.4)
WBC: 7.7 x10E3/uL (ref 3.4–10.8)

## 2023-11-13 LAB — CMP14+EGFR
ALT: 18 IU/L (ref 0–32)
AST: 23 IU/L (ref 0–40)
Albumin: 4.3 g/dL (ref 3.7–4.7)
Alkaline Phosphatase: 85 IU/L (ref 44–121)
BUN/Creatinine Ratio: 22 (ref 12–28)
BUN: 16 mg/dL (ref 8–27)
Bilirubin Total: 0.3 mg/dL (ref 0.0–1.2)
CO2: 23 mmol/L (ref 20–29)
Calcium: 9.6 mg/dL (ref 8.7–10.3)
Chloride: 102 mmol/L (ref 96–106)
Creatinine, Ser: 0.73 mg/dL (ref 0.57–1.00)
Globulin, Total: 2.7 g/dL (ref 1.5–4.5)
Glucose: 104 mg/dL — ABNORMAL HIGH (ref 70–99)
Potassium: 4.6 mmol/L (ref 3.5–5.2)
Sodium: 140 mmol/L (ref 134–144)
Total Protein: 7 g/dL (ref 6.0–8.5)
eGFR: 82 mL/min/1.73 (ref >59)

## 2023-11-13 LAB — TSH+FREE T4
Free T4: 1.4 ng/dL (ref 0.82–1.77)
TSH: 1 u[IU]/mL (ref 0.450–4.500)

## 2023-11-13 LAB — BRAIN NATRIURETIC PEPTIDE: BNP: 55.9 pg/mL (ref 0.0–100.0)

## 2023-11-13 NOTE — Progress Notes (Signed)
Hello Adelaida,  Your lab result is normal and/or stable.Some minor variations that are not significant are commonly marked abnormal, but do not represent any medical problem for you.  Best regards, Warren Stacks, M.D.

## 2023-11-14 DIAGNOSIS — S42211D Unspecified displaced fracture of surgical neck of right humerus, subsequent encounter for fracture with routine healing: Secondary | ICD-10-CM | POA: Diagnosis not present

## 2023-11-19 ENCOUNTER — Other Ambulatory Visit: Payer: Self-pay | Admitting: Family Medicine

## 2023-11-19 DIAGNOSIS — Z1231 Encounter for screening mammogram for malignant neoplasm of breast: Secondary | ICD-10-CM

## 2023-11-19 DIAGNOSIS — S42211D Unspecified displaced fracture of surgical neck of right humerus, subsequent encounter for fracture with routine healing: Secondary | ICD-10-CM | POA: Diagnosis not present

## 2023-11-21 DIAGNOSIS — S42201D Unspecified fracture of upper end of right humerus, subsequent encounter for fracture with routine healing: Secondary | ICD-10-CM | POA: Diagnosis not present

## 2023-11-26 ENCOUNTER — Other Ambulatory Visit: Payer: Self-pay

## 2023-11-26 ENCOUNTER — Ambulatory Visit: Admitting: Physical Medicine and Rehabilitation

## 2023-11-26 VITALS — BP 145/88 | HR 92

## 2023-11-26 DIAGNOSIS — M47816 Spondylosis without myelopathy or radiculopathy, lumbar region: Secondary | ICD-10-CM | POA: Diagnosis not present

## 2023-11-26 MED ORDER — METHYLPREDNISOLONE ACETATE 80 MG/ML IJ SUSP
40.0000 mg | Freq: Once | INTRAMUSCULAR | Status: AC
  Administered 2023-11-26: 40 mg

## 2023-11-26 NOTE — Progress Notes (Signed)
 Pain Scale   Average Pain 5 Patient advising she has chronic lower back pain and pain increases when bending and walking. Pain decreases with heat and rest        +Driver, -BT, -Dye Allergies.

## 2023-11-27 ENCOUNTER — Ambulatory Visit: Admitting: Family Medicine

## 2023-11-27 DIAGNOSIS — S42211D Unspecified displaced fracture of surgical neck of right humerus, subsequent encounter for fracture with routine healing: Secondary | ICD-10-CM | POA: Diagnosis not present

## 2023-11-29 DIAGNOSIS — S42211D Unspecified displaced fracture of surgical neck of right humerus, subsequent encounter for fracture with routine healing: Secondary | ICD-10-CM | POA: Diagnosis not present

## 2023-12-03 NOTE — Procedures (Signed)
 Lumbar Facet Joint Intra-Articular Injection(s) with Fluoroscopic Guidance  Patient: Cynthia Garrett      Date of Birth: [birth year] MRN: [id number] PCP: Zollie Lowers, MD      Visit Date: 11/26/2023   Universal Protocol:    Date/Time: 11/26/2023  Consent Given By: the patient  Position: PRONE   Additional Comments: Vital signs were monitored before and after the procedure. Patient was prepped and draped in the usual sterile fashion. The correct patient, procedure, and site was verified.   Injection Procedure Details:  Procedure Site One Meds Administered:  Meds ordered this encounter  Medications   methylPREDNISolone  acetate (DEPO-MEDROL ) injection 40 mg     Laterality: Bilateral  Location/Site:  L4-L5  Needle size: 22 guage  Needle type: Spinal  Needle Placement: Articular  Findings:  -Comments: Excellent flow of contrast producing a partial arthrogram.  Procedure Details: The fluoroscope beam is vertically oriented in AP, and the inferior recess is visualized beneath the lower pole of the inferior apophyseal process, which represents the target point for needle insertion. When direct visualization is difficult the target point is located at the medial projection of the vertebral pedicle. The region overlying each aforementioned target is locally anesthetized with a 1 to 2 ml. volume of 1% Lidocaine  without Epinephrine .   The spinal needle was inserted into each of the above mentioned facet joints using biplanar fluoroscopic guidance. A 0.25 to 0.5 ml. volume of Isovue-250 was injected and a partial facet joint arthrogram was obtained. A single spot film was obtained of the resulting arthrogram.    One to 1.25 ml of the steroid/anesthetic solution was then injected into each of the facet joints noted above.   Additional Comments:  No complications occurred Dressing: 2 x 2 sterile gauze and Band-Aid    Post-procedure details: Patient was observed during the  procedure. Post-procedure instructions were reviewed.  Patient left the clinic in stable condition.

## 2023-12-03 NOTE — Progress Notes (Signed)
 BEATRIX BREECE - 83 y.o. female MRN [id number]  Date of birth: 1941/01/16  Office Visit Note: Visit Date: 11/26/2023 PCP: Zollie Lowers, MD Referred by: Zollie Lowers, MD  Subjective: Chief Complaint  Patient presents with   Lower Back - Pain   HPI:  Cynthia Garrett is a 83 y.o. female who comes in today at the request of Duwaine Pouch, FNP for planned Bilateral  L4-5 Lumbar facet/medial branch block with fluoroscopic guidance.  The patient has failed conservative care including home exercise, medications, time and activity modification.  This injection will be diagnostic and hopefully therapeutic.  Please see requesting physician notes for further details and justification.  Exam has shown concordant pain with facet joint loading.   ROS Otherwise per HPI.  Assessment & Plan: Visit Diagnoses:    ICD-10-CM   1. Spondylosis without myelopathy or radiculopathy, lumbar region  M47.816 XR C-ARM NO REPORT    Facet Injection    methylPREDNISolone  acetate (DEPO-MEDROL ) injection 40 mg      Plan: No additional findings.   Meds & Orders:  Meds ordered this encounter  Medications   methylPREDNISolone  acetate (DEPO-MEDROL ) injection 40 mg    Orders Placed This Encounter  Procedures   Facet Injection   XR C-ARM NO REPORT    Follow-up: Return for visit to requesting provider as needed.   Procedures: No procedures performed  Lumbar Facet Joint Intra-Articular Injection(s) with Fluoroscopic Guidance  Patient: Cynthia Garrett      Date of Birth: [birth year] MRN: [id number] PCP: Zollie Lowers, MD      Visit Date: 11/26/2023   Universal Protocol:    Date/Time: 11/26/2023  Consent Given By: the patient  Position: PRONE   Additional Comments: Vital signs were monitored before and after the procedure. Patient was prepped and draped in the usual sterile fashion. The correct patient, procedure, and site was verified.   Injection Procedure Details:  Procedure Site  One Meds Administered:  Meds ordered this encounter  Medications   methylPREDNISolone  acetate (DEPO-MEDROL ) injection 40 mg     Laterality: Bilateral  Location/Site:  L4-L5  Needle size: 22 guage  Needle type: Spinal  Needle Placement: Articular  Findings:  -Comments: Excellent flow of contrast producing a partial arthrogram.  Procedure Details: The fluoroscope beam is vertically oriented in AP, and the inferior recess is visualized beneath the lower pole of the inferior apophyseal process, which represents the target point for needle insertion. When direct visualization is difficult the target point is located at the medial projection of the vertebral pedicle. The region overlying each aforementioned target is locally anesthetized with a 1 to 2 ml. volume of 1% Lidocaine  without Epinephrine .   The spinal needle was inserted into each of the above mentioned facet joints using biplanar fluoroscopic guidance. A 0.25 to 0.5 ml. volume of Isovue-250 was injected and a partial facet joint arthrogram was obtained. A single spot film was obtained of the resulting arthrogram.    One to 1.25 ml of the steroid/anesthetic solution was then injected into each of the facet joints noted above.   Additional Comments:  No complications occurred Dressing: 2 x 2 sterile gauze and Band-Aid    Post-procedure details: Patient was observed during the procedure. Post-procedure instructions were reviewed.  Patient left the clinic in stable condition.    Clinical History: CLINICAL DATA:  Chronic low back pain worsening over the past 6 months.   EXAM: MRI LUMBAR SPINE WITHOUT CONTRAST   TECHNIQUE: Multiplanar, multisequence MR imaging of  the lumbar spine was performed. No intravenous contrast was administered.   COMPARISON:  Radiographs 09/07/2021   FINDINGS: Segmentation: There are five lumbar type vertebral bodies. The last full intervertebral disc space is labeled L5-S1. This  correlates with the lumbar radiographs.   Alignment:  Degenerative anterolisthesis of L4 estimated at 7 mm.   Vertebrae:  No fracture, evidence of discitis, or bone lesion.   Conus medullaris and cauda equina: Conus extends to the T12-L1 level. Conus and cauda equina appear normal.   Paraspinal and other soft tissues: No significant paraspinal or retroperitoneal findings. No worrisome renal lesions, aortic aneurysm or retroperitoneal adenopathy.   Disc levels:   T12-L1: No significant findings.   L1-2: Mild bulging annulus, osteophytic ridging and mild to moderate facet disease contributing to mild bilateral lateral recess stenosis but no significant spinal or foraminal stenosis.   L2-3: Advanced degenerative disc disease with marked disc space narrowing. Broad-based bulging desiccated disc and osteophytic ridging in combination with moderate facet disease contributes to mild/moderate spinal and bilateral lateral recess stenosis. No foraminal stenosis.   L3-4: Bulging degenerated annulus, osteophytic ridging and facet disease contributing to mild spinal and bilateral lateral recess stenosis. No significant foraminal stenosis.   L4-5: Bulging and uncovered disc along with osteophytic ridging and severe facet disease contributing to moderate spinal and bilateral lateral recess stenosis. No significant foraminal stenosis.   L5-S1: Advanced left-sided facet disease and bulging uncovered disc with small central disc protrusion. No significant neural compression, spinal or foraminal stenosis.   IMPRESSION: 1. Degenerative anterolisthesis of L4 estimated at 7 mm. 2. Mild bilateral lateral recess stenosis at L1-2. 3. Mild/moderate multifactorial spinal and bilateral lateral recess stenosis at L2-3. 4. Mild multifactorial spinal and bilateral lateral recess stenosis at L3-4. 5. Moderate multifactorial spinal and bilateral lateral recess stenosis at L4-5. 6. Advanced left-sided  facet disease and bulging uncovered disc with small central disc protrusion at L5-S1. No significant neural compression, spinal or foraminal stenosis.     Electronically Signed   By: MYRTIS Stammer M.D.   On: 07/24/2023 17:42     Objective:  VS:  HT:    WT:   BMI:     BP:(!) 145/88  HR:92bpm  TEMP: ( )  RESP:  Physical Exam Vitals and nursing note reviewed.  Constitutional:      General: She is not in acute distress.    Appearance: Normal appearance. She is not ill-appearing.  HENT:     Head: Normocephalic and atraumatic.     Right Ear: External ear normal.     Left Ear: External ear normal.  Eyes:     Extraocular Movements: Extraocular movements intact.  Cardiovascular:     Rate and Rhythm: Normal rate.     Pulses: Normal pulses.  Pulmonary:     Effort: Pulmonary effort is normal. No respiratory distress.  Abdominal:     General: There is no distension.     Palpations: Abdomen is soft.  Musculoskeletal:        General: Tenderness present.     Cervical back: Neck supple.     Right lower leg: No edema.     Left lower leg: No edema.     Comments: Patient has good distal strength with no pain over the greater trochanters.  No clonus or focal weakness.  Skin:    Findings: No erythema, lesion or rash.  Neurological:     General: No focal deficit present.     Mental Status: She is alert and  oriented to person, place, and time.     Sensory: No sensory deficit.     Motor: No weakness or abnormal muscle tone.     Coordination: Coordination normal.  Psychiatric:        Mood and Affect: Mood normal.        Behavior: Behavior normal.      Imaging: No results found.

## 2023-12-05 DIAGNOSIS — S42211D Unspecified displaced fracture of surgical neck of right humerus, subsequent encounter for fracture with routine healing: Secondary | ICD-10-CM | POA: Diagnosis not present

## 2023-12-06 ENCOUNTER — Ambulatory Visit (INDEPENDENT_AMBULATORY_CARE_PROVIDER_SITE_OTHER): Admitting: Family Medicine

## 2023-12-06 ENCOUNTER — Encounter: Payer: Self-pay | Admitting: Family Medicine

## 2023-12-06 VITALS — BP 121/78 | HR 81 | Temp 97.8°F | Ht 62.0 in | Wt 127.4 lb

## 2023-12-06 DIAGNOSIS — R202 Paresthesia of skin: Secondary | ICD-10-CM | POA: Diagnosis not present

## 2023-12-06 DIAGNOSIS — I10 Essential (primary) hypertension: Secondary | ICD-10-CM | POA: Diagnosis not present

## 2023-12-06 DIAGNOSIS — Z7409 Other reduced mobility: Secondary | ICD-10-CM | POA: Diagnosis not present

## 2023-12-06 DIAGNOSIS — R200 Anesthesia of skin: Secondary | ICD-10-CM

## 2023-12-06 NOTE — Progress Notes (Signed)
 Subjective:  Patient ID: Cynthia Garrett, female    DOB: 1940-05-31  Age: 83 y.o. MRN: [id number]  CC: Edema (2 week follow up)   HPI  Discussed the use of AI scribe software for clinical note transcription with the patient, who gave verbal consent to proceed.  History of Present Illness Cynthia Garrett is an 83 year old female with a history of neuropathy who presents with swelling and numbness in her feet.  She experiences swelling in her feet, particularly across the top of her toes, which sometimes makes her toes feel tight and difficult to wiggle. The sensation is described as 'numb and prickly' rather than painful. The swelling is not present at the time of the visit.  She experiences cramps and a sensation of heaviness in her legs, describing it as feeling like she has 'leg weights on.' She notes that she wakes up at night with her left foot hurting without any apparent reason and experiences frequent cramping.  She reports having no balance and difficulty walking. She uses a walker for mobility as she does not feel secure with a cane.  She is currently taking hydrochlorothiazide  intermittently, not daily, due to the inconvenience of frequent urination. She mentions her blood pressure was 125 over something, which she considers fine.  She is undergoing physical therapy for a previously broken shoulder and mentions difficulty standing up to exercise. She is concerned about her balance and the risk of falling.  She has a history of neuropathy. She has previously been prescribed tramadol  for pain and gabapentin  for nerve-related pain, which helped with her sciatic nerve pain.  She takes mirtazapine  for sleep, having reduced the dose to half a pill, and Pristiq . She previously took vitamin B12 but stopped as she was unsure of its benefits.  No severe pain in feet but reports numbness and tingling. No numbness or tingling in tongue.          09/03/2023   10:31 AM 07/10/2023    10:37 AM 05/31/2023   11:39 AM  Depression screen PHQ 2/9  Decreased Interest 1 2   Down, Depressed, Hopeless 1 1   PHQ - 2 Score 2 3   Altered sleeping 1 2   Tired, decreased energy 1 3   Change in appetite 1 2   Feeling bad or failure about yourself  0 2   Trouble concentrating 0 0   Moving slowly or fidgety/restless 1 3   Suicidal thoughts 0 0   PHQ-9 Score 6 15   Difficult doing work/chores  Somewhat difficult Not difficult at all    History [name] has a past medical history of Allergic rhinitis, seasonal, Allergy, Anxiety, Arthritis, Cataract, Headache(784.0), Hyperlipidemia, Hypothyroidism, Left breast mass, Lung nodules, Osteopenia, PONV (postoperative nausea and vomiting), and Thyroid  disease.   She has a past surgical history that includes Hysterotomy; Colonoscopy; Cataract extraction w/PHACO (06/26/2011); Cataract extraction w/PHACO (07/06/2011); Breast lumpectomy with radioactive seed localization (Left, 12/06/2016); Breast excisional biopsy (Left, 2018); Bunionectomy (Right, 04/04/2021); Eye surgery; and Breast surgery.   Her family history includes COPD in her brother; Diabetes in her sister; Heart disease in her mother; Hypertension in her mother and sister.She reports that she quit smoking about 50 years ago. Her smoking use included cigarettes. She started smoking about 60 years ago. She has a 2.5 pack-year smoking history. She has never used smokeless tobacco. She reports that she does not drink alcohol and does not use drugs.    ROS Review of Systems  Constitutional: Negative.   HENT: Negative.    Eyes:  Negative for visual disturbance.  Respiratory:  Negative for shortness of breath.   Cardiovascular:  Positive for leg swelling. Negative for chest pain.  Gastrointestinal:  Negative for abdominal pain.  Musculoskeletal:  Negative for arthralgias.    Objective:  BP 121/78   Pulse 81   Temp 97.8 F (36.6 C)   Ht 5' 2 (1.575 m)   Wt 127 lb 6.4 oz (57.8 kg)    SpO2 98%   BMI 23.30 kg/m   BP Readings from Last 3 Encounters:  12/06/23 121/78  11/26/23 (!) 145/88  11/12/23 134/84    Wt Readings from Last 3 Encounters:  12/06/23 127 lb 6.4 oz (57.8 kg)  11/12/23 130 lb (59 kg)  09/15/23 134 lb (60.8 kg)     Physical Exam Physical Exam GENERAL: Alert, cooperative, well developed, no acute distress HEENT: Normocephalic, normal oropharynx, moist mucous membranes CHEST: Clear to auscultation bilaterally, no wheezes, rhonchi, or crackles CARDIOVASCULAR: Normal heart rate and rhythm, S1 and S2 normal without murmurs ABDOMEN: Soft, non-tender, non-distended, without organomegaly, normal bowel sounds EXTREMITIES: No cyanosis or edema NEUROLOGICAL: Cranial nerves grossly intact, moves all extremities without gross motor or sensory deficit   Assessment & Plan:  Numbness and tingling of both feet -     Vitamin B12 -     Folate  Impaired functional mobility, balance, gait, and endurance  Primary hypertension    Assessment and Plan Assessment & Plan 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 No severe pain is reported, and neuropathy is likely the cause of these symptoms. Tramadol  is available but not in use. Consider gabapentin  or pregabalin  for nerve-related pain if symptoms worsen. Check vitamin B12 and folic acid levels to rule out deficiency contributing to neuropathy.  Impaired balance and risk of falls   Her impaired balance and risk of falls are due to neuropathy and inability to wiggle toes. She currently uses a walker for stability. A previous fall resulted in a broken shoulder, and physical therapy is ongoing for the shoulder. Consider balance-focused physical therapy after completing current shoulder therapy.  Edema of lower extremities   She experiences  intermittent swelling of the lower extremities, particularly across the top of the toes. No dangerous underlying cause is identified from lab work, and the swelling is not associated with severe pain. Advise elevating feet above heart level to reduce swelling. Use hydrochlorothiazide  as needed for swelling, especially when not going out.  Hypertension   Her blood pressure is well-controlled at 125/?. Hydrochlorothiazide  aids in blood pressure management. Use hydrochlorothiazide  as needed for fluid management.       Follow-up: Return in about 6 weeks (around 01/17/2024).  Butler Der, M.D.

## 2023-12-07 LAB — FOLATE: Folate: 17.3 ng/mL (ref >3.0)

## 2023-12-07 LAB — VITAMIN B12: Vitamin B-12: 323 pg/mL (ref 232–1245)

## 2023-12-09 ENCOUNTER — Encounter: Payer: Self-pay | Admitting: Family Medicine

## 2023-12-11 DIAGNOSIS — H02882 Meibomian gland dysfunction right lower eyelid: Secondary | ICD-10-CM | POA: Diagnosis not present

## 2023-12-11 DIAGNOSIS — H02885 Meibomian gland dysfunction left lower eyelid: Secondary | ICD-10-CM | POA: Diagnosis not present

## 2023-12-11 DIAGNOSIS — Z961 Presence of intraocular lens: Secondary | ICD-10-CM | POA: Diagnosis not present

## 2023-12-11 DIAGNOSIS — S42211D Unspecified displaced fracture of surgical neck of right humerus, subsequent encounter for fracture with routine healing: Secondary | ICD-10-CM | POA: Diagnosis not present

## 2023-12-11 DIAGNOSIS — H01021 Squamous blepharitis right upper eyelid: Secondary | ICD-10-CM | POA: Diagnosis not present

## 2023-12-11 DIAGNOSIS — H01024 Squamous blepharitis left upper eyelid: Secondary | ICD-10-CM | POA: Diagnosis not present

## 2023-12-12 ENCOUNTER — Ambulatory Visit

## 2023-12-14 DIAGNOSIS — S42211D Unspecified displaced fracture of surgical neck of right humerus, subsequent encounter for fracture with routine healing: Secondary | ICD-10-CM | POA: Diagnosis not present

## 2023-12-17 DIAGNOSIS — S42211D Unspecified displaced fracture of surgical neck of right humerus, subsequent encounter for fracture with routine healing: Secondary | ICD-10-CM | POA: Diagnosis not present

## 2023-12-19 DIAGNOSIS — S42211D Unspecified displaced fracture of surgical neck of right humerus, subsequent encounter for fracture with routine healing: Secondary | ICD-10-CM | POA: Diagnosis not present

## 2023-12-24 DIAGNOSIS — S42211D Unspecified displaced fracture of surgical neck of right humerus, subsequent encounter for fracture with routine healing: Secondary | ICD-10-CM | POA: Diagnosis not present

## 2023-12-26 DIAGNOSIS — H353131 Nonexudative age-related macular degeneration, bilateral, early dry stage: Secondary | ICD-10-CM | POA: Diagnosis not present

## 2023-12-26 DIAGNOSIS — H43813 Vitreous degeneration, bilateral: Secondary | ICD-10-CM | POA: Diagnosis not present

## 2023-12-28 ENCOUNTER — Encounter: Payer: Self-pay | Admitting: *Deleted

## 2023-12-31 DIAGNOSIS — S42211D Unspecified displaced fracture of surgical neck of right humerus, subsequent encounter for fracture with routine healing: Secondary | ICD-10-CM | POA: Diagnosis not present

## 2024-01-02 DIAGNOSIS — S42201D Unspecified fracture of upper end of right humerus, subsequent encounter for fracture with routine healing: Secondary | ICD-10-CM | POA: Diagnosis not present

## 2024-01-07 ENCOUNTER — Telehealth: Payer: Self-pay | Admitting: Family Medicine

## 2024-01-07 ENCOUNTER — Encounter: Payer: Self-pay | Admitting: Radiology

## 2024-01-07 ENCOUNTER — Other Ambulatory Visit: Payer: Self-pay | Admitting: Family Medicine

## 2024-01-07 DIAGNOSIS — F514 Sleep terrors [night terrors]: Secondary | ICD-10-CM

## 2024-01-07 MED ORDER — DESVENLAFAXINE SUCCINATE ER 25 MG PO TB24: 25.0000 mg | ORAL_TABLET | Freq: Every day | ORAL | 2 refills | Status: DC

## 2024-01-07 NOTE — Telephone Encounter (Signed)
 I sent in a prescription for the lower dose (25 mg) of pristiq . Follow up in office in 2-3 weeks to see how it is doing for her

## 2024-01-07 NOTE — Telephone Encounter (Signed)
 Pt notified and placed on schedule. LS

## 2024-01-07 NOTE — Telephone Encounter (Signed)
 Copied from CRM [id number]. Topic: Clinical - Medication Question >> Jan 07, 2024  8:55 AM Diannia H wrote: Reason for CRM: Patient is calling because provider has her on desvenlafaxine  (PRISTIQ ) 50 MG 24 hr tablet and after about 30 mins of taking the medicine she starts to shake and feels funny. Her ears also starts popping. She wants to know can he send her a new rx in for a lighter dosage. Patients callback number is [phone].

## 2024-01-07 NOTE — Telephone Encounter (Signed)
 Pt reports that she has been feeling off balance, shaky and having a buzzing in her ears for quite some time. Pt believes its due to the pristiq  because the two days she did not take it she felt better and symptoms went away. Can pt get on a lower dose or possibly discontinue?

## 2024-01-10 ENCOUNTER — Telehealth: Payer: Self-pay | Admitting: Physical Medicine and Rehabilitation

## 2024-01-10 NOTE — Telephone Encounter (Signed)
Patient called. She would like an appointment with Dr. Newton.  

## 2024-01-14 ENCOUNTER — Ambulatory Visit

## 2024-01-15 ENCOUNTER — Ambulatory Visit
Admission: RE | Admit: 2024-01-15 | Discharge: 2024-01-15 | Disposition: A | Source: Ambulatory Visit | Attending: Family Medicine | Admitting: Family Medicine

## 2024-01-15 DIAGNOSIS — Z1231 Encounter for screening mammogram for malignant neoplasm of breast: Secondary | ICD-10-CM

## 2024-01-17 ENCOUNTER — Ambulatory Visit: Admitting: Physical Medicine and Rehabilitation

## 2024-01-23 ENCOUNTER — Encounter: Payer: Self-pay | Admitting: Physical Medicine and Rehabilitation

## 2024-01-23 ENCOUNTER — Ambulatory Visit: Admitting: Physical Medicine and Rehabilitation

## 2024-01-23 DIAGNOSIS — M48062 Spinal stenosis, lumbar region with neurogenic claudication: Secondary | ICD-10-CM

## 2024-01-23 DIAGNOSIS — M5416 Radiculopathy, lumbar region: Secondary | ICD-10-CM | POA: Diagnosis not present

## 2024-01-23 DIAGNOSIS — R269 Unspecified abnormalities of gait and mobility: Secondary | ICD-10-CM | POA: Diagnosis not present

## 2024-01-23 NOTE — Progress Notes (Signed)
 Pain Scale   Average Pain 10 Patient advising she has chronic lower back pain that increases when standing and walking and pain decreases when sitting        +Driver, -BT, -Dye Allergies.

## 2024-01-23 NOTE — Progress Notes (Signed)
 Cynthia Garrett - 83 y.o. female MRN [id number]  Date of birth: 09-11-1940  Office Visit Note: Visit Date: 01/23/2024 PCP: Zollie Lowers, MD Referred by: Zollie Lowers, MD  Subjective: Chief Complaint  Patient presents with   Lower Back - Pain   HPI: Cynthia Garrett is a 83 y.o. female who comes in today for evaluation of chronic, worsening and severe bilateral lower back pain. Pain ongoing for several years, worsens with prolonged standing and walking. She describes pain as sore and aching sensation, currently rates as 8 out of 10. Feel she has to lean over for comfort.  Some relief of pain with Tramadol  and Zanaflex . She recently had to stop formal physical therapy due to increased pain. Lumbar MRI imaging from May of this year shows moderate multifactorial spinal and bilateral lateral recess stenosis at L4-L5. Advanced left-sided facet disease and bulging uncovered disc with small central disc protrusion at L5-S1. History of left L4-L5 interlaminar epidural steroid injection and bilateral L4-L5 facet joint injection with minimal relief of pain. Patient currently using rolling walker to assist with ambulation. No recent trauma or falls.      Review of Systems  Musculoskeletal:  Positive for back pain.  Neurological:  Negative for tingling, sensory change, focal weakness and weakness.  All other systems reviewed and are negative.  Otherwise per HPI.  Assessment & Plan: Visit Diagnoses:    ICD-10-CM   1. Lumbar radiculopathy  M54.16 Ambulatory referral to Physical Medicine Rehab    2. Spinal stenosis of lumbar region with neurogenic claudication  M48.062 Ambulatory referral to Physical Medicine Rehab    3. Gait abnormality  R26.9 Ambulatory referral to Physical Medicine Rehab       Plan: Findings:  Chronic, worsening and severe bilateral lower back pain. Patient continues to have severe pain despite good conservative therapies such as formal physical therapy, home exercise  regimen, rest and use of medications. Although, she does not have classic presentation or neurogenic claudication, I do think her pain fits with this. There is central canal stenosis at L4-L5. Severe pain with prolonged standing and walking. We discussed treatment plan in detail today. Next step is to perform diagnostic and hopefully therapeutic bilateral L4 transforaminal epidural steroid injection. Patient also mentioned possibility of surgery. I explained to her that with the degenerative anterolisthesis at L4-L5 that she would likely require fusion. Given her advanced age I am unsure she would be great surgical candidate, but I am happy to refer if she chooses. She would like to proceed with injection at this time. Other treatment options would be chronic pain management and re-grouping with physical therapy. No red flag symptoms noted upon exam today.     Meds & Orders: No orders of the defined types were placed in this encounter.   Orders Placed This Encounter  Procedures   Ambulatory referral to Physical Medicine Rehab    Follow-up: Return for Bilateral L4 transforaminal epidural steroid injection.   Procedures: No procedures performed      Clinical History: CLINICAL DATA:  Chronic low back pain worsening over the past 6 months.   EXAM: MRI LUMBAR SPINE WITHOUT CONTRAST   TECHNIQUE: Multiplanar, multisequence MR imaging of the lumbar spine was performed. No intravenous contrast was administered.   COMPARISON:  Radiographs 09/07/2021   FINDINGS: Segmentation: There are five lumbar type vertebral bodies. The last full intervertebral disc space is labeled L5-S1. This correlates with the lumbar radiographs.   Alignment:  Degenerative anterolisthesis of L4 estimated at  7 mm.   Vertebrae:  No fracture, evidence of discitis, or bone lesion.   Conus medullaris and cauda equina: Conus extends to the T12-L1 level. Conus and cauda equina appear normal.   Paraspinal and other soft  tissues: No significant paraspinal or retroperitoneal findings. No worrisome renal lesions, aortic aneurysm or retroperitoneal adenopathy.   Disc levels:   T12-L1: No significant findings.   L1-2: Mild bulging annulus, osteophytic ridging and mild to moderate facet disease contributing to mild bilateral lateral recess stenosis but no significant spinal or foraminal stenosis.   L2-3: Advanced degenerative disc disease with marked disc space narrowing. Broad-based bulging desiccated disc and osteophytic ridging in combination with moderate facet disease contributes to mild/moderate spinal and bilateral lateral recess stenosis. No foraminal stenosis.   L3-4: Bulging degenerated annulus, osteophytic ridging and facet disease contributing to mild spinal and bilateral lateral recess stenosis. No significant foraminal stenosis.   L4-5: Bulging and uncovered disc along with osteophytic ridging and severe facet disease contributing to moderate spinal and bilateral lateral recess stenosis. No significant foraminal stenosis.   L5-S1: Advanced left-sided facet disease and bulging uncovered disc with small central disc protrusion. No significant neural compression, spinal or foraminal stenosis.   IMPRESSION: 1. Degenerative anterolisthesis of L4 estimated at 7 mm. 2. Mild bilateral lateral recess stenosis at L1-2. 3. Mild/moderate multifactorial spinal and bilateral lateral recess stenosis at L2-3. 4. Mild multifactorial spinal and bilateral lateral recess stenosis at L3-4. 5. Moderate multifactorial spinal and bilateral lateral recess stenosis at L4-5. 6. Advanced left-sided facet disease and bulging uncovered disc with small central disc protrusion at L5-S1. No significant neural compression, spinal or foraminal stenosis.     Electronically Signed   By: MYRTIS Stammer M.D.   On: 07/24/2023 17:42   She reports that she quit smoking about 50 years ago. Her smoking use included  cigarettes. She started smoking about 60 years ago. She has a 2.5 pack-year smoking history. She has never used smokeless tobacco. No results for input(s): HGBA1C, LABURIC in the last 8760 hours.  Objective:  VS:  HT:    WT:   BMI:     BP:   HR: bpm  TEMP: ( )  RESP:  Physical Exam Vitals and nursing note reviewed.  HENT:     Head: Normocephalic and atraumatic.     Right Ear: External ear normal.     Left Ear: External ear normal.     Nose: Nose normal.     Mouth/Throat:     Mouth: Mucous membranes are moist.  Eyes:     Extraocular Movements: Extraocular movements intact.  Cardiovascular:     Rate and Rhythm: Normal rate.     Pulses: Normal pulses.  Pulmonary:     Effort: Pulmonary effort is normal.  Abdominal:     General: Abdomen is flat. There is no distension.  Musculoskeletal:        General: Tenderness present.     Cervical back: Normal range of motion.     Comments: Patient is slow to rise from seated position to standing. Pain noted with facet loading and lumbar extension. 5/5 strength noted with bilateral hip flexion, knee flexion/extension, ankle dorsiflexion/plantarflexion and EHL. No clonus noted bilaterally. No pain upon palpation of greater trochanters. No pain with internal/external rotation of bilateral hips. Sensation intact bilaterally. Negative slump test bilaterally. Ambulates with rolling walker, gait slow and unsteady.   Skin:    General: Skin is warm and dry.     Capillary Refill:  Capillary refill takes less than 2 seconds.  Neurological:     Mental Status: She is alert and oriented to person, place, and time.     Gait: Gait abnormal.  Psychiatric:        Mood and Affect: Mood normal.        Behavior: Behavior normal.     Ortho Exam  Imaging: No results found.  Past Medical/Family/Surgical/Social History: Medications & Allergies reviewed per EMR, new medications updated. 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 12/06/2016   Chronic seasonal allergic rhinitis 11/17/2015   Anxiety state 11/17/2015   Hypothyroidism 11/17/2015   Hyperlipidemia 11/17/2015   Osteopenia 11/17/2015   Tendonitis 11/17/2015   Multiple lung nodules on CT 10/25/2015   Irritable bowel syndrome with both constipation and diarrhea 09/17/2015   Past Medical History:  Diagnosis Date   Allergic rhinitis, seasonal    Allergy    Anxiety    Arthritis    Cataract    extraction   Headache(784.0)    Hyperlipidemia    Hypothyroidism    Left breast mass    Lung nodules    Osteopenia    PONV (postoperative nausea and vomiting)    Thyroid  disease    Family History  Problem Relation Age of Onset   Heart disease Mother    Hypertension Mother    Diabetes Sister    Hypertension Sister    COPD Brother    Anesthesia problems Neg Hx    Hypotension Neg Hx    Malignant hyperthermia Neg Hx    Pseudochol deficiency Neg Hx    Colon cancer Neg Hx    Cancer Neg Hx    Breast cancer Neg Hx    Past Surgical History:  Procedure Laterality Date   BREAST EXCISIONAL BIOPSY Left 2018   BREAST LUMPECTOMY WITH RADIOACTIVE SEED LOCALIZATION Left 12/06/2016   Procedure: LEFT BREAST LUMPECTOMY WITH RADIOACTIVE SEED LOCALIZATION;  Surgeon: Gail Favorite, MD;  Location: [facility];  Service: General;  Laterality: Left;   BREAST SURGERY     BUNIONECTOMY Right 04/04/2021   5th toe   CATARACT EXTRACTION W/PHACO  06/26/2011   Procedure: CATARACT EXTRACTION PHACO AND INTRAOCULAR LENS PLACEMENT (IOC);  Surgeon: Cherene Mania, MD;  Location: [facility];  Service: Ophthalmology;  Laterality: Right;  CDE 10.34   CATARACT EXTRACTION W/PHACO  07/06/2011   Procedure: CATARACT EXTRACTION PHACO AND INTRAOCULAR LENS PLACEMENT (IOC);  Surgeon: Cherene Mania, MD;  Location: [facility];  Service: Ophthalmology;  Laterality: Left;  CDE:9.92    COLONOSCOPY     EYE SURGERY     Hysterotomy     fibroid tumor   Social History   Occupational History    Employer: RETIRED  Tobacco Use   Smoking status: Former    Current packs/day: 0.00    Average packs/day: 0.3 packs/day for 10.0 years (2.5 ttl pk-yrs)    Types: Cigarettes    Start date: 06/08/1963    Quit date: 06/07/1973    Years since quitting: 50.6   Smokeless tobacco: Never   Tobacco comments:    Significant second-hand exposure.  Vaping Use   Vaping status: Never Used  Substance and Sexual Activity   Alcohol use: Never    Comment: social   Drug use: No   Sexual activity: Yes    Birth control/protection: Post-menopausal

## 2024-01-29 ENCOUNTER — Other Ambulatory Visit: Payer: Self-pay | Admitting: Family Medicine

## 2024-01-29 DIAGNOSIS — F514 Sleep terrors [night terrors]: Secondary | ICD-10-CM

## 2024-01-30 ENCOUNTER — Ambulatory Visit (INDEPENDENT_AMBULATORY_CARE_PROVIDER_SITE_OTHER): Admitting: Family Medicine

## 2024-01-30 ENCOUNTER — Encounter: Payer: Self-pay | Admitting: Family Medicine

## 2024-01-30 VITALS — BP 111/71 | HR 85 | Temp 97.1°F | Ht 62.0 in | Wt 127.0 lb

## 2024-01-30 DIAGNOSIS — G6289 Other specified polyneuropathies: Secondary | ICD-10-CM | POA: Diagnosis not present

## 2024-01-30 DIAGNOSIS — E538 Deficiency of other specified B group vitamins: Secondary | ICD-10-CM | POA: Diagnosis not present

## 2024-01-30 DIAGNOSIS — F411 Generalized anxiety disorder: Secondary | ICD-10-CM

## 2024-01-30 DIAGNOSIS — E782 Mixed hyperlipidemia: Secondary | ICD-10-CM

## 2024-01-30 DIAGNOSIS — R600 Localized edema: Secondary | ICD-10-CM

## 2024-01-30 MED ORDER — CYANOCOBALAMIN 1000 MCG/ML IJ SOLN
1000.0000 ug | INTRAMUSCULAR | Status: AC
  Administered 2024-01-30 – 2024-02-26 (×2): 1000 ug via INTRAMUSCULAR

## 2024-01-30 NOTE — Progress Notes (Signed)
 Subjective:  Patient ID: Cynthia Garrett, female    DOB: [birth year]  Age: 83 y.o. MRN: [id number]  CC: Medical Management of Chronic Issues (No concerns at this time. )   HPI  Discussed the use of AI scribe software for clinical note transcription with the patient, who gave verbal consent to proceed.  History of Present Illness Cynthia Garrett is an 83 year old female who presents with fatigue, anxiety, and back pain.  She experiences significant fatigue, describing a lack of energy sufficient to walk across the yard. She reports increasing weakness and a sensation of feeling 'jerky all over on the inside.' Despite taking Pristiq  25 mg every other day, she continues to feel anxious and lacks energy. Her vitamin B12 level is on the low side of normal, and she takes B12 supplements.  She experiences back pain that worsens with bending over, such as when washing her face or brushing her teeth. She has consulted a back specialist who suggested another epidural, but she is not a candidate for surgery. She takes half a pill at bedtime to aid sleep, which provides temporary pain relief and helps her sleep.  She has noticed swelling in her ankles, which her physical therapist suggested might be due to poor circulation. She takes a fluid pill but not daily, as it causes weakness and frequent urination. She attempts to stay hydrated by drinking water and protein drinks.  She has experienced unintentional weight loss, having dropped to 124 pounds at one point, but is currently stable at 127 pounds. She tries to maintain her weight by drinking milkshakes. A chest x-ray three months ago was normal, showing no signs of pneumonia, heart failure, or lung disease. No fluid in her lungs.       09/03/2023   10:31 AM 05/01/2023    8:31 AM 11/02/2022    8:08 AM 09/21/2022    8:23 AM  GAD 7 : Generalized Anxiety Score  Nervous, Anxious, on Edge 1 1 0 1  Control/stop worrying 1 1 1 1   Worry too much -  different things 1 1 1  0  Trouble relaxing 0 0 1 0  Restless 0 0 0 0  Easily annoyed or irritable 0 0 0 0  Afraid - awful might happen 0 0 1 0  Total GAD 7 Score 3 3 4 2   Anxiety Difficulty Not difficult at all Not difficult at all Not difficult at all Not difficult at all          09/03/2023   10:31 AM 07/10/2023   10:37 AM 05/31/2023   11:39 AM  Depression screen PHQ 2/9  Decreased Interest 1 2   Down, Depressed, Hopeless 1 1   PHQ - 2 Score 2 3   Altered sleeping 1 2   Tired, decreased energy 1 3   Change in appetite 1 2   Feeling bad or failure about yourself  0 2   Trouble concentrating 0 0   Moving slowly or fidgety/restless 1 3   Suicidal thoughts 0 0   PHQ-9 Score 6  15    Difficult doing work/chores  Somewhat difficult Not difficult at all     Data saved with a previous flowsheet row definition    History Charolette has a past medical history of Allergic rhinitis, seasonal, Allergy, Anxiety, Arthritis, Cataract, Headache(784.0), Hyperlipidemia, Hypothyroidism, Left breast mass, Lung nodules, Osteopenia, PONV (postoperative nausea and vomiting), and Thyroid  disease.   She has a past surgical history that includes Hysterotomy;  Colonoscopy; Cataract extraction w/PHACO (06/26/2011); Cataract extraction w/PHACO (07/06/2011); Breast lumpectomy with radioactive seed localization (Left, 12/06/2016); Breast excisional biopsy (Left, 2018); Bunionectomy (Right, 04/04/2021); Eye surgery; and Breast surgery.   Her family history includes COPD in her brother; Diabetes in her sister; Heart disease in her mother; Hypertension in her mother and sister.She reports that she quit smoking about 50 years ago. Her smoking use included cigarettes. She started smoking about 60 years ago. She has a 2.5 pack-year smoking history. She has never used smokeless tobacco. She reports that she does not drink alcohol and does not use drugs.    ROS Review of Systems  Constitutional:  Positive for fatigue  and unexpected weight change.  HENT:  Negative for congestion.   Eyes:  Negative for visual disturbance.  Respiratory:  Negative for shortness of breath.   Cardiovascular:  Negative for chest pain.  Gastrointestinal:  Negative for abdominal pain, constipation, diarrhea and vomiting.  Genitourinary:  Negative for difficulty urinating.  Musculoskeletal:  Negative for arthralgias and myalgias.  Neurological:  Negative for headaches.  Psychiatric/Behavioral:  Negative for sleep disturbance. The patient is nervous/anxious.     Objective:  BP 111/71   Pulse 85   Temp (!) 97.1 F (36.2 C)   Ht 5' 2 (1.575 m)   Wt 127 lb (57.6 kg)   SpO2 97%   BMI 23.23 kg/m   BP Readings from Last 3 Encounters:  01/30/24 111/71  12/06/23 121/78  11/26/23 (!) 145/88    Wt Readings from Last 3 Encounters:  01/30/24 127 lb (57.6 kg)  12/06/23 127 lb 6.4 oz (57.8 kg)  11/12/23 130 lb (59 kg)     Physical Exam Physical Exam MEASUREMENTS: Weight- 127. GENERAL: Alert, cooperative, well developed, no acute distress. HEENT: Normocephalic, normal oropharynx, moist mucous membranes. CHEST: Clear to auscultation bilaterally, no wheezes, rhonchi, or crackles. CARDIOVASCULAR: Normal heart rate and rhythm, S1 and S2 normal without murmurs. ABDOMEN: Soft, non-tender, non-distended, without organomegaly, normal bowel sounds. EXTREMITIES: No cyanosis or edema. NEUROLOGICAL: Cranial nerves grossly intact, moves all extremities without gross motor or sensory deficit.   Assessment & Plan:  Vitamin B 12 deficiency -     Cyanocobalamin   Other polyneuropathy  Leg edema  Anxiety state  Mixed hyperlipidemia    Assessment and Plan Assessment & Plan Peripheral neuropathy with lower extremity numbness and pain   She is not a candidate for surgery.  Impaired balance and risk of falls   She experiences impaired balance and an increased risk of falls, requiring a walker for stability. She acknowledges  the necessity of continuing to use a walker for safety.  Edema of lower extremities   Chronic edema in the lower extremities shows some improvement, though puffiness persists. Hydrochlorothiazide  is limited due to side effects like weakness and dehydration. Poor circulation is suggested as a possible cause. She should elevate her feet when sitting to reduce swelling and consider wearing compression hose for additional support.  Depression and anxiety   Chronic depression and anxiety result in feelings of anxiety and low energy. Pristiq  25 mg every other day is not adequately controlling symptoms. Mirtazapine , half a pill at bedtime, aids sleep and provides temporary relief from anxiety and pain. She will continue both medications as prescribed.  Deficiency of B group vitamins   Her vitamin B12 level is low-normal. Despite taking B12 supplements, an increased dosage may be beneficial. A B12 injection was administered today, and she should continue supplements, considering doubling the dosage.  Hyperlipidemia  Long-standing hyperlipidemia is managed with pravastatin , which effectively reduces cholesterol and prevents cardiovascular events. However, she reports muscle aches and weakness, possibly related to pravastatin . She will try taking pravastatin  every other night for one month. If symptoms persist, pravastatin  will be stopped completely and reassessed.       Follow-up: Return in about 3 months (around 05/01/2024), or if symptoms worsen or fail to improve.  Butler Der, M.D.

## 2024-02-19 ENCOUNTER — Ambulatory Visit: Admitting: Physical Medicine and Rehabilitation

## 2024-02-19 ENCOUNTER — Other Ambulatory Visit: Payer: Self-pay

## 2024-02-19 VITALS — BP 131/83 | HR 88

## 2024-02-19 DIAGNOSIS — M48062 Spinal stenosis, lumbar region with neurogenic claudication: Secondary | ICD-10-CM

## 2024-02-19 DIAGNOSIS — M5416 Radiculopathy, lumbar region: Secondary | ICD-10-CM

## 2024-02-19 DIAGNOSIS — R269 Unspecified abnormalities of gait and mobility: Secondary | ICD-10-CM

## 2024-02-19 MED ORDER — METHYLPREDNISOLONE ACETATE 40 MG/ML IJ SUSP
40.0000 mg | Freq: Once | INTRAMUSCULAR | Status: AC
  Administered 2024-02-19: 14:00:00 40 mg

## 2024-02-19 NOTE — Procedures (Signed)
 Lumbosacral Transforaminal Epidural Steroid Injection - Sub-Pedicular Approach with Fluoroscopic Guidance  Patient: Cynthia Garrett      Date of Birth: 01-20-1941 MRN: [id number] PCP: Zollie Lowers, MD      Visit Date: 02/19/2024   Universal Protocol:    Date/Time: 02/19/2024  Consent Given By: the patient  Position: PRONE  Additional Comments: Vital signs were monitored before and after the procedure. Patient was prepped and draped in the usual sterile fashion. The correct patient, procedure, and site was verified.   Injection Procedure Details:   Procedure diagnoses: Lumbar radiculopathy [M54.16]    Meds Administered:  Meds ordered this encounter  Medications   methylPREDNISolone  acetate (DEPO-MEDROL ) injection 40 mg    Laterality: Bilateral  Location/Site: L4  Needle:5.0 in., 22 ga.  Short bevel or Quincke spinal needle  Needle Placement: Transforaminal  Findings:    -Comments: Excellent flow of contrast along the nerve, nerve root and into the epidural space.  Procedure Details: After squaring off the end-plates to get a true AP view, the C-arm was positioned so that an oblique view of the foramen as noted above was visualized. The target area is just inferior to the nose of the scotty dog or sub pedicular. The soft tissues overlying this structure were infiltrated with 2-3 ml. of 1% Lidocaine  without Epinephrine .  The spinal needle was inserted toward the target using a trajectory view along the fluoroscope beam.  Under AP and lateral visualization, the needle was advanced so it did not puncture dura and was located close the 6 O'Clock position of the pedical in AP tracterory. Biplanar projections were used to confirm position. Aspiration was confirmed to be negative for CSF and/or blood. A 1-2 ml. volume of Isovue-250 was injected and flow of contrast was noted at each level. Radiographs were obtained for documentation purposes.   After attaining the desired  flow of contrast documented above, a 0.5 to 1.0 ml test dose of 0.25% Marcaine  was injected into each respective transforaminal space.  The patient was observed for 90 seconds post injection.  After no sensory deficits were reported, and normal lower extremity motor function was noted,   the above injectate was administered so that equal amounts of the injectate were placed at each foramen (level) into the transforaminal epidural space.   Additional Comments:  The patient tolerated the procedure well Dressing: 2 x 2 sterile gauze and Band-Aid    Post-procedure details: Patient was observed during the procedure. Post-procedure instructions were reviewed.  Patient left the clinic in stable condition.

## 2024-02-19 NOTE — Progress Notes (Signed)
 Pain Scale   Average Pain 4 Patient advising she has chronic lower back pain radiating to bilateral hips. Pain increases when walking and decreasing when sitting.        +Driver, -BT, -Dye Allergies.

## 2024-02-19 NOTE — Progress Notes (Signed)
 Cynthia Garrett - 83 y.o. female MRN [id number]  Date of birth: [birth year]  Office Visit Note: Visit Date: 02/19/2024 PCP: Zollie Lowers, MD Referred by: Zollie Lowers, MD  Subjective: Chief Complaint  Patient presents with   Lower Back - Pain   HPI:  Cynthia Garrett is a 83 y.o. female who comes in today at the request of Duwaine Pouch, FNP for planned Bilateral L4-5 Lumbar Transforaminal epidural steroid injection with fluoroscopic guidance.  The patient has failed conservative care including home exercise, medications, time and activity modification.  This injection will be diagnostic and hopefully therapeutic.  Please see requesting physician notes for further details and justification.   ROS Otherwise per HPI.  Assessment & Plan: Visit Diagnoses:    ICD-10-CM   1. Lumbar radiculopathy  M54.16 XR C-ARM NO REPORT    Epidural Steroid injection    methylPREDNISolone  acetate (DEPO-MEDROL ) injection 40 mg    Ambulatory referral to Physical Therapy    2. Spinal stenosis of lumbar region with neurogenic claudication  M48.062 Ambulatory referral to Physical Therapy    3. Gait abnormality  R26.9 Ambulatory referral to Physical Therapy      Plan: No additional findings.   Meds & Orders:  Meds ordered this encounter  Medications   methylPREDNISolone  acetate (DEPO-MEDROL ) injection 40 mg    Orders Placed This Encounter  Procedures   XR C-ARM NO REPORT   Ambulatory referral to Physical Therapy   Epidural Steroid injection    Follow-up: Return for visit to requesting provider as needed.   Procedures: No procedures performed  Lumbosacral Transforaminal Epidural Steroid Injection - Sub-Pedicular Approach with Fluoroscopic Guidance  Patient: Cynthia Garrett      Date of Birth: Oct 23, 1940 MRN: [id number] PCP: Zollie Lowers, MD      Visit Date: 02/19/2024   Universal Protocol:    Date/Time: 02/19/2024  Consent Given By: the patient  Position: PRONE  Additional  Comments: Vital signs were monitored before and after the procedure. Patient was prepped and draped in the usual sterile fashion. The correct patient, procedure, and site was verified.   Injection Procedure Details:   Procedure diagnoses: Lumbar radiculopathy [M54.16]    Meds Administered:  Meds ordered this encounter  Medications   methylPREDNISolone  acetate (DEPO-MEDROL ) injection 40 mg    Laterality: Bilateral  Location/Site: L4  Needle:5.0 in., 22 ga.  Short bevel or Quincke spinal needle  Needle Placement: Transforaminal  Findings:    -Comments: Excellent flow of contrast along the nerve, nerve root and into the epidural space.  Procedure Details: After squaring off the end-plates to get a true AP view, the C-arm was positioned so that an oblique view of the foramen as noted above was visualized. The target area is just inferior to the nose of the scotty dog or sub pedicular. The soft tissues overlying this structure were infiltrated with 2-3 ml. of 1% Lidocaine  without Epinephrine .  The spinal needle was inserted toward the target using a trajectory view along the fluoroscope beam.  Under AP and lateral visualization, the needle was advanced so it did not puncture dura and was located close the 6 O'Clock position of the pedical in AP tracterory. Biplanar projections were used to confirm position. Aspiration was confirmed to be negative for CSF and/or blood. A 1-2 ml. volume of Isovue-250 was injected and flow of contrast was noted at each level. Radiographs were obtained for documentation purposes.   After attaining the desired flow of contrast documented above, a 0.5 to  1.0 ml test dose of 0.25% Marcaine  was injected into each respective transforaminal space.  The patient was observed for 90 seconds post injection.  After no sensory deficits were reported, and normal lower extremity motor function was noted,   the above injectate was administered so that equal amounts of  the injectate were placed at each foramen (level) into the transforaminal epidural space.   Additional Comments:  The patient tolerated the procedure well Dressing: 2 x 2 sterile gauze and Band-Aid    Post-procedure details: Patient was observed during the procedure. Post-procedure instructions were reviewed.  Patient left the clinic in stable condition.    Clinical History: CLINICAL DATA:  Chronic low back pain worsening over the past 6 months.   EXAM: MRI LUMBAR SPINE WITHOUT CONTRAST   TECHNIQUE: Multiplanar, multisequence MR imaging of the lumbar spine was performed. No intravenous contrast was administered.   COMPARISON:  Radiographs 09/07/2021   FINDINGS: Segmentation: There are five lumbar type vertebral bodies. The last full intervertebral disc space is labeled L5-S1. This correlates with the lumbar radiographs.   Alignment:  Degenerative anterolisthesis of L4 estimated at 7 mm.   Vertebrae:  No fracture, evidence of discitis, or bone lesion.   Conus medullaris and cauda equina: Conus extends to the T12-L1 level. Conus and cauda equina appear normal.   Paraspinal and other soft tissues: No significant paraspinal or retroperitoneal findings. No worrisome renal lesions, aortic aneurysm or retroperitoneal adenopathy.   Disc levels:   T12-L1: No significant findings.   L1-2: Mild bulging annulus, osteophytic ridging and mild to moderate facet disease contributing to mild bilateral lateral recess stenosis but no significant spinal or foraminal stenosis.   L2-3: Advanced degenerative disc disease with marked disc space narrowing. Broad-based bulging desiccated disc and osteophytic ridging in combination with moderate facet disease contributes to mild/moderate spinal and bilateral lateral recess stenosis. No foraminal stenosis.   L3-4: Bulging degenerated annulus, osteophytic ridging and facet disease contributing to mild spinal and bilateral lateral  recess stenosis. No significant foraminal stenosis.   L4-5: Bulging and uncovered disc along with osteophytic ridging and severe facet disease contributing to moderate spinal and bilateral lateral recess stenosis. No significant foraminal stenosis.   L5-S1: Advanced left-sided facet disease and bulging uncovered disc with small central disc protrusion. No significant neural compression, spinal or foraminal stenosis.   IMPRESSION: 1. Degenerative anterolisthesis of L4 estimated at 7 mm. 2. Mild bilateral lateral recess stenosis at L1-2. 3. Mild/moderate multifactorial spinal and bilateral lateral recess stenosis at L2-3. 4. Mild multifactorial spinal and bilateral lateral recess stenosis at L3-4. 5. Moderate multifactorial spinal and bilateral lateral recess stenosis at L4-5. 6. Advanced left-sided facet disease and bulging uncovered disc with small central disc protrusion at L5-S1. No significant neural compression, spinal or foraminal stenosis.     Electronically Signed   By: MYRTIS Stammer M.D.   On: 07/24/2023 17:42     Objective:  VS:  HT:    WT:   BMI:     BP:131/83  HR:88bpm  TEMP: ( )  RESP:  Physical Exam Vitals and nursing note reviewed.  Constitutional:      General: She is not in acute distress.    Appearance: Normal appearance. She is not ill-appearing.  HENT:     Head: Normocephalic and atraumatic.     Right Ear: External ear normal.     Left Ear: External ear normal.  Eyes:     Extraocular Movements: Extraocular movements intact.  Cardiovascular:  Rate and Rhythm: Normal rate.     Pulses: Normal pulses.  Pulmonary:     Effort: Pulmonary effort is normal. No respiratory distress.  Abdominal:     General: There is no distension.     Palpations: Abdomen is soft.  Musculoskeletal:        General: Tenderness present.     Cervical back: Neck supple.     Right lower leg: No edema.     Left lower leg: No edema.     Comments: Patient has good  distal strength with no pain over the greater trochanters.  No clonus or focal weakness.  Skin:    Findings: No erythema, lesion or rash.  Neurological:     General: No focal deficit present.     Mental Status: She is alert and oriented to person, place, and time.     Sensory: No sensory deficit.     Motor: No weakness or abnormal muscle tone.     Coordination: Coordination normal.  Psychiatric:        Mood and Affect: Mood normal.        Behavior: Behavior normal.      Imaging: No results found.

## 2024-02-26 ENCOUNTER — Ambulatory Visit: Payer: Self-pay | Admitting: Family Medicine

## 2024-02-26 ENCOUNTER — Encounter: Payer: Self-pay | Admitting: Family Medicine

## 2024-02-26 VITALS — BP 124/74 | HR 80 | Temp 97.9°F | Ht 62.0 in | Wt 125.0 lb

## 2024-02-26 DIAGNOSIS — E039 Hypothyroidism, unspecified: Secondary | ICD-10-CM

## 2024-02-26 DIAGNOSIS — E538 Deficiency of other specified B group vitamins: Secondary | ICD-10-CM | POA: Diagnosis not present

## 2024-02-26 DIAGNOSIS — E782 Mixed hyperlipidemia: Secondary | ICD-10-CM

## 2024-02-26 NOTE — Progress Notes (Signed)
 "  Subjective:  Patient ID: Cynthia Garrett, female    DOB: Mar 21, 1940  Age: 83 y.o. MRN: [id number]  CC: Medical Management of Chronic Issues (No concerns )   HPI  Discussed the use of AI scribe software for clinical note transcription with the patient, who gave verbal consent to proceed.  History of Present Illness Cynthia Garrett is an 83 year old female who presents for a routine checkup and B12 shot.  She is two days early for her B12 injection but feels better since starting the shots. She has also begun taking creatine to help build muscle. She is concerned about insurance coverage for the B12 shot but is willing to pay out of pocket if necessary.  She experiences swelling in her legs, which occurred yesterday after spending the day at the ER with her acquaintance, Richard. She attributes the swelling to prolonged sitting without being able to elevate her feet. She avoids salt and chooses low-salt options when shopping.  She has a history of falls and is cautious about walking to prevent further injuries. She previously fell and broke her shoulder, leading to a period of inactivity. She is currently using a walker and undergoing therapy for her back and gait, with plans to resume therapy on January 8th. She hopes to strengthen her legs to improve mobility.  She continues to experience numbness and tingling in the bottom of her feet, which has not improved despite her current medication regimen, including B12 supplementation.  She has received her flu shot and is generally doing well, aside from the swelling and numbness issues.          02/26/2024    8:15 AM 09/03/2023   10:31 AM 07/10/2023   10:37 AM  Depression screen PHQ 2/9  Decreased Interest  1 2  Down, Depressed, Hopeless  1 1  PHQ - 2 Score  2 3  Altered sleeping  1 2  Tired, decreased energy  1 3  Change in appetite  1 2  Feeling bad or failure about yourself   0 2  Trouble concentrating  0 0  Moving slowly or  fidgety/restless  1 3  Suicidal thoughts  0 0  PHQ-9 Score  6  15   Difficult doing work/chores Not difficult at all  Somewhat difficult     Data saved with a previous flowsheet row definition    History Cynthia Garrett has a past medical history of Allergic rhinitis, seasonal, Allergy, Anxiety, Arthritis, Cataract, Headache(784.0), Hyperlipidemia, Hypothyroidism, Left breast mass, Lung nodules, Osteopenia, PONV (postoperative nausea and vomiting), and Thyroid  disease.   She has a past surgical history that includes Hysterotomy; Colonoscopy; Cataract extraction w/PHACO (06/26/2011); Cataract extraction w/PHACO (07/06/2011); Breast lumpectomy with radioactive seed localization (Left, 12/06/2016); Breast excisional biopsy (Left, 2018); Bunionectomy (Right, 04/04/2021); Eye surgery; and Breast surgery.   Her family history includes COPD in her brother; Diabetes in her sister; Heart disease in her mother; Hypertension in her mother and sister.She reports that she quit smoking about 50 years ago. Her smoking use included cigarettes. She started smoking about 60 years ago. She has a 2.5 pack-year smoking history. She has never used smokeless tobacco. She reports that she does not drink alcohol and does not use drugs.    ROS Review of Systems  Constitutional: Negative.   HENT:  Negative for congestion.   Eyes:  Negative for visual disturbance.  Respiratory:  Negative for shortness of breath.   Cardiovascular:  Negative for chest pain.  Gastrointestinal:  Negative for abdominal pain, constipation, diarrhea, nausea and vomiting.  Genitourinary:  Negative for difficulty urinating.  Musculoskeletal:  Negative for arthralgias and myalgias.  Neurological:  Negative for headaches.  Psychiatric/Behavioral:  Negative for sleep disturbance.     Objective:  BP 124/74   Pulse 80   Temp 97.9 F (36.6 C)   Ht 5' 2 (1.575 m)   Wt 125 lb (56.7 kg)   SpO2 95%   BMI 22.86 kg/m   BP Readings from Last 3  Encounters:  02/26/24 124/74  02/19/24 131/83  01/30/24 111/71    Wt Readings from Last 3 Encounters:  02/26/24 125 lb (56.7 kg)  01/30/24 127 lb (57.6 kg)  12/06/23 127 lb 6.4 oz (57.8 kg)     Physical Exam Physical Exam GENERAL: Alert, cooperative, well developed, no acute distress HEENT: Normocephalic, normal oropharynx, moist mucous membranes CHEST: Clear to auscultation bilaterally, No wheezes, rhonchi, or crackles CARDIOVASCULAR: Normal heart rate and rhythm, S1 and S2 normal without murmurs ABDOMEN: Soft, non-tender, non-distended, without organomegaly, Normal bowel sounds EXTREMITIES: No cyanosis or edema NEUROLOGICAL: Cranial nerves grossly intact, Moves all extremities without gross motor or sensory deficit   Assessment & Plan:  Hypothyroidism, unspecified type  Mixed hyperlipidemia    Assessment and Plan Assessment & Plan Vitamin B12 deficiency   Managed with cyanocobalamin  injections, she reports feeling better, likely due to the B12 injections. Insurance coverage for the injection was discussed, and it was decided to proceed with the injection despite being two days early. Administered B12 injection today.  Polyneuropathy   Persistent numbness and tingling at the bottom of the feet with no improvement noted despite current medication regimen, including B12 supplementation.  Back pain   Chronic back pain with plans to resume therapy on January 8th. She is advised to use lightweight weights for shoulder strengthening and is encouraged to continue physical activity to improve strength and prevent falls.  Hypothyroidism   Monitored with no specific discussion of symptoms or changes in management during this visit. Will check thyroid  function in three months.  General Health Maintenance   Received a flu shot and is advised to avoid salt to manage leg edema. Creatine supplementation was discussed, with a note that it may cause swelling. Avoid salt in diet and  monitor for increased swelling with creatine use.       Follow-up: Return in about 3 months (around 05/26/2024).  Butler Der, M.D. "

## 2024-03-04 ENCOUNTER — Ambulatory Visit: Admitting: Family Medicine

## 2024-03-20 ENCOUNTER — Telehealth: Payer: Self-pay | Admitting: Physical Medicine and Rehabilitation

## 2024-03-20 NOTE — Telephone Encounter (Signed)
 Pt called stating she had an injection and it did not work and pt states newton discussed getting her fitting back brace. Please call pt about this message at [phone].

## 2024-04-15 ENCOUNTER — Ambulatory Visit

## 2024-04-17 ENCOUNTER — Ambulatory Visit: Admitting: Physical Medicine and Rehabilitation

## 2024-04-18 ENCOUNTER — Ambulatory Visit

## 2024-05-28 ENCOUNTER — Ambulatory Visit: Admitting: Family Medicine

## 2024-07-09 ENCOUNTER — Encounter
